# Patient Record
Sex: Female | Born: 1940
Health system: Southern US, Community
[De-identification: ages and names within clinical notes are randomized; demographics above are authoritative.]

## PROBLEM LIST (undated history)

## (undated) DIAGNOSIS — M1990 Unspecified osteoarthritis, unspecified site: Secondary | ICD-10-CM

## (undated) DIAGNOSIS — F419 Anxiety disorder, unspecified: Secondary | ICD-10-CM

## (undated) DIAGNOSIS — H269 Unspecified cataract: Secondary | ICD-10-CM

## (undated) DIAGNOSIS — F32A Depression, unspecified: Secondary | ICD-10-CM

## (undated) DIAGNOSIS — E119 Type 2 diabetes mellitus without complications: Secondary | ICD-10-CM

## (undated) DIAGNOSIS — E079 Disorder of thyroid, unspecified: Secondary | ICD-10-CM

## (undated) DIAGNOSIS — Q159 Congenital malformation of eye, unspecified: Secondary | ICD-10-CM

## (undated) DIAGNOSIS — F329 Major depressive disorder, single episode, unspecified: Secondary | ICD-10-CM

## (undated) DIAGNOSIS — I10 Essential (primary) hypertension: Secondary | ICD-10-CM

## (undated) DIAGNOSIS — E785 Hyperlipidemia, unspecified: Secondary | ICD-10-CM

## (undated) DIAGNOSIS — K219 Gastro-esophageal reflux disease without esophagitis: Secondary | ICD-10-CM

## (undated) DIAGNOSIS — W19XXXA Unspecified fall, initial encounter: Secondary | ICD-10-CM

## (undated) HISTORY — PX: TONSILLECTOMY: SUR1361

## (undated) HISTORY — PX: SALIVARY STONE REMOVAL: SHX5213

## (undated) HISTORY — DX: Depression, unspecified: F32.A

## (undated) HISTORY — DX: Disorder of thyroid, unspecified: E07.9

## (undated) HISTORY — DX: Congenital malformation of eye, unspecified: Q15.9

## (undated) HISTORY — DX: Hyperlipidemia, unspecified: E78.5

## (undated) HISTORY — DX: Major depressive disorder, single episode, unspecified: F32.9

## (undated) HISTORY — DX: Unspecified osteoarthritis, unspecified site: M19.90

## (undated) HISTORY — DX: Type 2 diabetes mellitus without complications: E11.9

## (undated) HISTORY — DX: Unspecified cataract: H26.9

## (undated) HISTORY — DX: Essential (primary) hypertension: I10

## (undated) HISTORY — DX: Gastro-esophageal reflux disease without esophagitis: K21.9

## (undated) HISTORY — DX: Unspecified fall, initial encounter: W19.XXXA

## (undated) HISTORY — DX: Anxiety disorder, unspecified: F41.9

---

## 1976-04-07 HISTORY — PX: ABDOMINAL HYSTERECTOMY: SHX81

## 1978-04-07 HISTORY — PX: BREAST LUMPECTOMY: SHX2

## 2011-03-11 DIAGNOSIS — E559 Vitamin D deficiency, unspecified: Secondary | ICD-10-CM | POA: Insufficient documentation

## 2011-03-11 DIAGNOSIS — R7309 Other abnormal glucose: Secondary | ICD-10-CM | POA: Insufficient documentation

## 2011-03-11 DIAGNOSIS — K219 Gastro-esophageal reflux disease without esophagitis: Secondary | ICD-10-CM | POA: Insufficient documentation

## 2011-03-11 DIAGNOSIS — E669 Obesity, unspecified: Secondary | ICD-10-CM | POA: Insufficient documentation

## 2013-08-05 DIAGNOSIS — R9439 Abnormal result of other cardiovascular function study: Secondary | ICD-10-CM | POA: Insufficient documentation

## 2015-06-15 DIAGNOSIS — M25511 Pain in right shoulder: Secondary | ICD-10-CM | POA: Diagnosis not present

## 2015-06-15 DIAGNOSIS — M170 Bilateral primary osteoarthritis of knee: Secondary | ICD-10-CM | POA: Diagnosis not present

## 2015-07-12 DIAGNOSIS — R748 Abnormal levels of other serum enzymes: Secondary | ICD-10-CM | POA: Diagnosis not present

## 2015-07-12 DIAGNOSIS — E784 Other hyperlipidemia: Secondary | ICD-10-CM | POA: Diagnosis not present

## 2015-07-12 DIAGNOSIS — E559 Vitamin D deficiency, unspecified: Secondary | ICD-10-CM | POA: Diagnosis not present

## 2015-07-12 DIAGNOSIS — R7309 Other abnormal glucose: Secondary | ICD-10-CM | POA: Diagnosis not present

## 2015-08-09 DIAGNOSIS — Z0000 Encounter for general adult medical examination without abnormal findings: Secondary | ICD-10-CM | POA: Diagnosis not present

## 2015-08-09 DIAGNOSIS — E784 Other hyperlipidemia: Secondary | ICD-10-CM | POA: Diagnosis not present

## 2015-08-09 DIAGNOSIS — R7309 Other abnormal glucose: Secondary | ICD-10-CM | POA: Diagnosis not present

## 2015-11-26 DIAGNOSIS — M25512 Pain in left shoulder: Secondary | ICD-10-CM | POA: Diagnosis not present

## 2015-11-26 DIAGNOSIS — M170 Bilateral primary osteoarthritis of knee: Secondary | ICD-10-CM | POA: Diagnosis not present

## 2015-11-26 DIAGNOSIS — M25511 Pain in right shoulder: Secondary | ICD-10-CM | POA: Diagnosis not present

## 2015-11-26 DIAGNOSIS — K219 Gastro-esophageal reflux disease without esophagitis: Secondary | ICD-10-CM | POA: Diagnosis not present

## 2015-11-26 DIAGNOSIS — L309 Dermatitis, unspecified: Secondary | ICD-10-CM | POA: Diagnosis not present

## 2016-01-15 DIAGNOSIS — Z23 Encounter for immunization: Secondary | ICD-10-CM | POA: Diagnosis not present

## 2016-04-18 DIAGNOSIS — I10 Essential (primary) hypertension: Secondary | ICD-10-CM | POA: Diagnosis not present

## 2016-04-18 DIAGNOSIS — L309 Dermatitis, unspecified: Secondary | ICD-10-CM | POA: Diagnosis not present

## 2016-10-13 DIAGNOSIS — I10 Essential (primary) hypertension: Secondary | ICD-10-CM | POA: Diagnosis not present

## 2016-10-20 DIAGNOSIS — L309 Dermatitis, unspecified: Secondary | ICD-10-CM | POA: Diagnosis not present

## 2016-10-20 DIAGNOSIS — E7800 Pure hypercholesterolemia, unspecified: Secondary | ICD-10-CM | POA: Diagnosis not present

## 2016-10-20 DIAGNOSIS — Z6834 Body mass index (BMI) 34.0-34.9, adult: Secondary | ICD-10-CM | POA: Diagnosis not present

## 2016-10-20 DIAGNOSIS — I10 Essential (primary) hypertension: Secondary | ICD-10-CM | POA: Diagnosis not present

## 2016-10-20 DIAGNOSIS — R748 Abnormal levels of other serum enzymes: Secondary | ICD-10-CM | POA: Diagnosis not present

## 2016-10-20 DIAGNOSIS — F329 Major depressive disorder, single episode, unspecified: Secondary | ICD-10-CM | POA: Diagnosis not present

## 2016-11-05 DIAGNOSIS — Z86010 Personal history of colonic polyps: Secondary | ICD-10-CM | POA: Insufficient documentation

## 2016-11-05 DIAGNOSIS — K580 Irritable bowel syndrome with diarrhea: Secondary | ICD-10-CM | POA: Insufficient documentation

## 2016-11-05 DIAGNOSIS — R748 Abnormal levels of other serum enzymes: Secondary | ICD-10-CM | POA: Diagnosis not present

## 2016-11-07 DIAGNOSIS — R748 Abnormal levels of other serum enzymes: Secondary | ICD-10-CM | POA: Diagnosis not present

## 2016-11-07 DIAGNOSIS — Z780 Asymptomatic menopausal state: Secondary | ICD-10-CM | POA: Diagnosis not present

## 2016-11-19 DIAGNOSIS — Z13820 Encounter for screening for osteoporosis: Secondary | ICD-10-CM | POA: Diagnosis not present

## 2016-11-19 DIAGNOSIS — Z780 Asymptomatic menopausal state: Secondary | ICD-10-CM | POA: Diagnosis not present

## 2017-01-20 DIAGNOSIS — Z23 Encounter for immunization: Secondary | ICD-10-CM | POA: Diagnosis not present

## 2017-04-16 ENCOUNTER — Ambulatory Visit (INDEPENDENT_AMBULATORY_CARE_PROVIDER_SITE_OTHER): Payer: Medicare Other | Admitting: Nurse Practitioner

## 2017-04-16 ENCOUNTER — Encounter: Payer: Self-pay | Admitting: Nurse Practitioner

## 2017-04-16 VITALS — BP 132/78 | HR 62 | Temp 98.5°F | Resp 17 | Ht 64.0 in | Wt 196.0 lb

## 2017-04-16 DIAGNOSIS — J00 Acute nasopharyngitis [common cold]: Secondary | ICD-10-CM | POA: Diagnosis not present

## 2017-04-16 DIAGNOSIS — I10 Essential (primary) hypertension: Secondary | ICD-10-CM | POA: Diagnosis not present

## 2017-04-16 DIAGNOSIS — M159 Polyosteoarthritis, unspecified: Secondary | ICD-10-CM

## 2017-04-16 DIAGNOSIS — F419 Anxiety disorder, unspecified: Secondary | ICD-10-CM | POA: Diagnosis not present

## 2017-04-16 DIAGNOSIS — M150 Primary generalized (osteo)arthritis: Secondary | ICD-10-CM

## 2017-04-16 DIAGNOSIS — M1990 Unspecified osteoarthritis, unspecified site: Secondary | ICD-10-CM | POA: Insufficient documentation

## 2017-04-16 DIAGNOSIS — E782 Mixed hyperlipidemia: Secondary | ICD-10-CM

## 2017-04-16 DIAGNOSIS — F329 Major depressive disorder, single episode, unspecified: Secondary | ICD-10-CM | POA: Diagnosis not present

## 2017-04-16 DIAGNOSIS — E785 Hyperlipidemia, unspecified: Secondary | ICD-10-CM | POA: Insufficient documentation

## 2017-04-16 NOTE — Progress Notes (Signed)
Careteam: Patient Care Team: Lauree Chandler, NP as PCP - General (Geriatric Medicine)  Advanced Directive information Does Patient Have a Medical Advance Directive?: Yes, Type of Advance Directive: [place];Living will  No Known Allergies  Chief Complaint  Patient presents with  . Medical Management of Chronic Issues    Pt is being seen to establish care for management of chronic conditions. Pt has had a sore throat for 2 days  . Other    Daughter in room  . Depression    Score of 4; pt reports she takes medication for depression      HPI: Patient is a 77 y.o. female seen in the office today to establish care.   HTN- controlled on lisinopril-hctz, metoprolol  Hyperlipidemia- on lipitor 10 mg daily, omega-3 supplement   OA- using cymbalta and aleve- taking BID routinely and using turmeric   Depression- on Cymbalta  GERD- on nexium, controlled at this time. Also using probiotic   Taking VIt d supplement? Why "I guess I was low on it"  Hx of thyroid disease and was on medication (40 years ago unsure what medication this was) rechecked lab years later and it was fine and then they took her off. Thinking it was hypo - but not sure.  Right sided torn rotator cuff in 2011; recommended surgery but did not have.   Sore throat and post nasal drip for 2 days. Cough but nonproductive.  Feels like there is something in throat/scratchy feeling No fevers or chills.   Number 1 priority is taking care of her husband. Wants to minimize doctors visits because that is time away from him.  Review of Systems:  Review of Systems  HENT: Positive for hearing loss, sore throat and tinnitus (rarely, notices it at night). Negative for congestion and sinus pain.        Full set of dentures for most of her life, her front teeth were bad when she was young and it was cheaper to remove all teeth and get denture  Respiratory: Positive for cough. Negative for sputum  production and shortness of breath.   Cardiovascular: Negative for chest pain, palpitations and leg swelling.       Reports she has MVP  Gastrointestinal: Positive for heartburn. Negative for abdominal pain, constipation and diarrhea.       Hx of hiatal hernia Hx of fecal incontinence   Genitourinary: Positive for frequency and urgency. Negative for dysuria.       OAB, decrease control, urinary incontinence   Musculoskeletal: Positive for joint pain.       Hx of OA in shoulders and knees  Neurological: Negative for dizziness and headaches.  Endo/Heme/Allergies: Positive for environmental allergies.  Psychiatric/Behavioral: Positive for depression. The patient is nervous/anxious.     Past Medical History:  Diagnosis Date  . Cataract   . 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 Hyperlipemia   . Hypertension    Past Surgical History:  Procedure Laterality Date  . ABDOMINAL HYSTERECTOMY  1978  . BREAST LUMPECTOMY  1980  . SALIVARY STONE REMOVAL  1980's  . TONSILLECTOMY  1950's   Social History:   reports that  has never smoked. she has never used smokeless tobacco. She reports that she does not drink alcohol or use drugs.  Family History  Problem Relation Age of Onset  . Heart attack Father 55  . Heart attack Sister   . Arthritis Sister  Medications: Patient's Medications  New Prescriptions   No medications on file  Previous Medications   ASPIRIN EC 81 MG TABLET    Take 81 mg by mouth daily.   ATORVASTATIN (LIPITOR) 10 MG TABLET    Take 10 mg by mouth at bedtime.   CHOLECALCIFEROL (VITAMIN D3) 2000 UNITS TABS    Take 1 tablet by mouth daily.   DULOXETINE (CYMBALTA) 60 MG CAPSULE    Take 60 mg by mouth daily.   ESOMEPRAZOLE (NEXIUM) 20 MG CAPSULE    Take 20 mg by mouth daily at 12 noon.   KRILL OIL (OMEGA-3) 500 MG CAPS    Take 1 capsule by mouth 2 (two) times daily.   LISINOPRIL-HYDROCHLOROTHIAZIDE (PRINZIDE,ZESTORETIC) 10-12.5 MG TABLET    Take 1  tablet by mouth daily.   METOPROLOL SUCCINATE 50 MG CS24    Take 1 tablet by mouth daily.   NAPROXEN SODIUM (ALEVE) 220 MG TABLET    Take 220 mg by mouth 2 (two) times daily as needed.   PROBIOTIC PRODUCT (PROBIOTIC DAILY PO)    Take 1 tablet by mouth daily.   TURMERIC CURCUMIN 500 MG CAPS    Take 1 capsule by mouth 2 (two) times daily.  Modified Medications   No medications on file  Discontinued Medications   No medications on file     Physical Exam:  Vitals:   04/16/17 0857  BP: 132/78  Pulse: 62  Resp: 17  Temp: 98.5 F (36.9 C)  TempSrc: Oral  SpO2: 96%  Weight: 196 lb (88.9 kg)  Height: _0  (1.626 m)   Body mass index is 33.64 kg/m.  Physical Exam  Constitutional: She is oriented to person, place, and time. She appears well-developed and well-nourished. No distress.  HENT:  Head: Normocephalic and atraumatic.  Right Ear: External ear normal.  Left Ear: External ear normal.  Nose: Nose normal.  Mouth/Throat: Oropharynx is clear and moist. No oropharyngeal exudate.  Eyes: Conjunctivae and EOM are normal. Pupils are equal, round, and reactive to light.  Neck: Normal range of motion. Neck supple.  Cardiovascular: Normal rate, regular rhythm and normal heart sounds.  Pulmonary/Chest: Effort normal and breath sounds normal.  Abdominal: Soft. Bowel sounds are normal.  Musculoskeletal: She exhibits no edema.       Right shoulder: She exhibits decreased range of motion, tenderness and pain (with movement).       Left shoulder: She exhibits decreased range of motion.  Neurological: She is alert and oriented to person, place, and time. No cranial nerve deficit. Coordination normal.  Skin: Skin is warm and dry. She is not diaphoretic.  Psychiatric: She has a normal mood and affect.   Labs reviewed: Basic Metabolic Panel: No results for input(s): NA, K, CL, CO2, GLUCOSE, BUN, CREATININE, CALCIUM, MG, PHOS, TSH in the last 8760 hours. Liver Function Tests: No results for  input(s): AST, ALT, ALKPHOS, BILITOT, PROT, ALBUMIN in the last 8760 hours. No results for input(s): LIPASE, AMYLASE in the last 8760 hours. No results for input(s): AMMONIA in the last 8760 hours. CBC: No results for input(s): WBC, NEUTROABS, HGB, HCT, MCV, PLT in the last 8760 hours. Lipid Panel: No results for input(s): CHOL, HDL, LDLCALC, TRIG, CHOLHDL, LDLDIRECT in the last 8760 hours. TSH: No results for input(s): TSH in the last 8760 hours. A1C: No results found for: HGBA1C   Assessment/Plan 1. Essential hypertension Controlled with lisinopril-hctz and metoprolol  - CBC with Differential/Platelets; Future  2. Mixed hyperlipidemia -conts on lipitor  10 mg daily with diet modifications.  - CMP with eGFR; Future - Lipid Panel; Future  3. Anxiety and depression Stable on cymbalta 30 mg daily  4. Primary osteoarthritis involving multiple joints Has had injections to knees and shoulder in the past, does not feel like she wants to have another injection this at this time. Educated about use of NSAIDS.  -she plans to stop aleve and use tylenol PRN  5. Acute nasopharyngitis -supportive care, instructions provided   Next appt: 3 months for EV, to get fasting blood work and [employer] prior  [employer]. Harle Battiest  [facility] [phone] 8 am - 5 pm) [phone] (after hours)

## 2017-04-16 NOTE — Patient Instructions (Signed)
For sinus congestion- neti pot twice daily Plain nasal saline spray throughout the day as needed humidifier in the home to help with the dry air. avoid forcefully blowing nose as this can cause rebound congestion  For sore throat- May use tylenol 500 mg 2 tablets every 8 hours as needed aches and pains or sore throat Warm water with honey and lemon  For cough and congestion- Mucinex DM by mouth twice daily as needed for cough and congestion with full glass of water  Keep well hydrated Cough drops  Avoid NSAIDS To use tylenol for pain Muscle rubs with lidocaine also beneficial

## 2017-04-17 ENCOUNTER — Telehealth: Payer: Self-pay | Admitting: *Deleted

## 2017-04-17 NOTE — Telephone Encounter (Signed)
Patient called and stated that she saw Jessica yesterday. Stated that Panamajessica prescribed patient to take Mucinex for her Sore Throat. Stated that she has gotten 2 tablets in but every time she takes it she feels like she is choking on her congestion. Patient stated that she is not going to take anymore of it. Wants to know if you have any suggestions. Please Advise.

## 2017-04-20 NOTE — Telephone Encounter (Signed)
Patient notified and agreed.  

## 2017-04-20 NOTE — Telephone Encounter (Signed)
She should take tylenol for sore throat, mucinex is for congestion, she can use MUCINEX DM for cough and congestion. To increase water intake

## 2017-05-22 ENCOUNTER — Ambulatory Visit (INDEPENDENT_AMBULATORY_CARE_PROVIDER_SITE_OTHER): Payer: Medicare Other

## 2017-05-22 ENCOUNTER — Telehealth: Payer: Self-pay

## 2017-05-22 ENCOUNTER — Other Ambulatory Visit: Payer: Self-pay

## 2017-05-22 VITALS — BP 135/78 | HR 62 | Temp 98.1°F | Ht 64.0 in | Wt 197.0 lb

## 2017-05-22 DIAGNOSIS — Z0000 Encounter for general adult medical examination without abnormal findings: Secondary | ICD-10-CM | POA: Diagnosis not present

## 2017-05-22 DIAGNOSIS — E782 Mixed hyperlipidemia: Secondary | ICD-10-CM | POA: Diagnosis not present

## 2017-05-22 DIAGNOSIS — I10 Essential (primary) hypertension: Secondary | ICD-10-CM | POA: Diagnosis not present

## 2017-05-22 DIAGNOSIS — Z135 Encounter for screening for eye and ear disorders: Secondary | ICD-10-CM | POA: Diagnosis not present

## 2017-05-22 DIAGNOSIS — E2839 Other primary ovarian failure: Secondary | ICD-10-CM | POA: Diagnosis not present

## 2017-05-22 LAB — CBC WITH DIFFERENTIAL/PLATELET
Basophils Absolute: 99 cells/uL (ref 0–200)
Basophils Relative: 1.1 %
Eosinophils Absolute: 405 cells/uL (ref 15–500)
Eosinophils Relative: 4.5 %
HCT: 43.9 % (ref 35.0–45.0)
Hemoglobin: 14.5 g/dL (ref 11.7–15.5)
Lymphs Abs: 4086 cells/uL — ABNORMAL HIGH (ref 850–3900)
MCH: 28.9 pg (ref 27.0–33.0)
MCHC: 33 g/dL (ref 32.0–36.0)
MCV: 87.5 fL (ref 80.0–100.0)
MPV: 10.9 fL (ref 7.5–12.5)
Monocytes Relative: 6.2 %
Neutro Abs: 3852 cells/uL (ref 1500–7800)
Neutrophils Relative %: 42.8 %
Platelets: 308 10*3/uL (ref 140–400)
RBC: 5.02 10*6/uL (ref 3.80–5.10)
RDW: 13.7 % (ref 11.0–15.0)
Total Lymphocyte: 45.4 %
WBC mixed population: 558 cells/uL (ref 200–950)
WBC: 9 10*3/uL (ref 3.8–10.8)

## 2017-05-22 LAB — COMPLETE METABOLIC PANEL WITH GFR
AG Ratio: 1.7 (calc) (ref 1.0–2.5)
ALT: 18 U/L (ref 6–29)
AST: 17 U/L (ref 10–35)
Albumin: 3.9 g/dL (ref 3.6–5.1)
Alkaline phosphatase (APISO): 193 U/L — ABNORMAL HIGH (ref 33–130)
BUN: 20 mg/dL (ref 7–25)
CO2: 27 mmol/L (ref 20–32)
Calcium: 9.9 mg/dL (ref 8.6–10.4)
Chloride: 104 mmol/L (ref 98–110)
Creat: 0.72 mg/dL (ref 0.60–0.93)
GFR, Est African American: 94 mL/min/{1.73_m2} (ref >=60)
GFR, Est Non African American: 81 mL/min/{1.73_m2} (ref >=60)
Globulin: 2.3 g/dL (calc) (ref 1.9–3.7)
Glucose, Bld: 109 mg/dL — ABNORMAL HIGH (ref 65–99)
Potassium: 4.9 mmol/L (ref 3.5–5.3)
Sodium: 140 mmol/L (ref 135–146)
Total Bilirubin: 0.6 mg/dL (ref 0.2–1.2)
Total Protein: 6.2 g/dL (ref 6.1–8.1)

## 2017-05-22 LAB — LIPID PANEL
Cholesterol: 153 mg/dL (ref <200)
HDL: 53 mg/dL (ref >50)
LDL Cholesterol (Calc): 74 mg/dL (calc)
Non-HDL Cholesterol (Calc): 100 mg/dL (calc) (ref <130)
Total CHOL/HDL Ratio: 2.9 (calc) (ref <5.0)
Triglycerides: 157 mg/dL — ABNORMAL HIGH (ref <150)

## 2017-05-22 MED ORDER — ZOSTER VAC RECOMB ADJUVANTED 50 MCG/0.5ML IM SUSR: 0.5000 mL | Freq: Once | INTRAMUSCULAR | 1 refills | Status: AC

## 2017-05-22 NOTE — Progress Notes (Signed)
Subjective:   April Hill is a 77 y.o. female who presents for Medicare Annual (Subsequent) preventive examination.  Last AWV-08/09/2015    Objective:     Vitals: BP 135/78 (BP Location: Left Arm, Patient Position: Sitting)   Pulse 62   Temp 98.1 F (36.7 C) (Oral)   Ht 5\' 4"  (1.626 m)   Wt 197 lb (89.4 kg)   SpO2 95%   BMI 33.81 kg/m   Body mass index is 33.81 kg/m.  Advanced Directives 05/22/2017 04/16/2017  Does Patient Have a Medical Advance Directive? Yes Yes  Type of Estate agent of [place];Living will Healthcare Power of [place];Living will  Does patient want to make changes to medical advance directive? No - Patient declined -  Copy of Healthcare Power of Attorney in Chart? No - copy requested No - copy requested    Tobacco Social History   Tobacco Use  Smoking Status Never Smoker  Smokeless Tobacco Never Used     Counseling given: Not Answered   Clinical Intake:  Pre-visit preparation completed: No  Pain : 0-10 Pain Score: 6  Pain Type: Chronic pain Pain Location: Back Pain Orientation: Upper, Mid, Lower Pain Radiating Towards: Right arm Pain Descriptors / Indicators: Aching Pain Onset: More than a month ago Pain Frequency: Constant     Nutritional Status: BMI > 30  Obese Nutritional Risks: None Diabetes: No  How often do you need to have someone help you when you read instructions, pamphlets, or other written materials from your doctor or pharmacy?: 1 - Never What is the last grade level you completed in school?: [employer]  Interpreter Needed?: No  Information entered by :: [name], RN  Past Medical History:  Diagnosis Date  . Anxiety and depression   . Cataract   . 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 GERD (gastroesophageal reflux disease)   . Hyperlipemia   . Hypertension   . Osteoarthritis   . Thyroid disease    Past Surgical History:  Procedure Laterality Date  . ABDOMINAL  HYSTERECTOMY  1978  . BREAST LUMPECTOMY  1980  . SALIVARY STONE REMOVAL  1980's  . TONSILLECTOMY  1950's   Family History  Problem Relation Age of Onset  . Hypertension Mother   . Heart disease Mother   . Heart attack Father 32  . Heart disease Father   . Heart attack Sister   . Arthritis Sister   . Diabetes Brother    Social History   Socioeconomic History  . Marital status: Married    Spouse name: None  . Number of children: None  . Years of education: None  . Highest education level: None  Social Needs  . Financial resource strain: Not hard at all  . Food insecurity - worry: Never true  . Food insecurity - inability: Never true  . Transportation needs - medical: No  . Transportation needs - non-medical: No  Occupational History  . None  Tobacco Use  . Smoking status: Never Smoker  . Smokeless tobacco: Never Used  Substance and Sexual Activity  . Alcohol use: No    Frequency: Never  . Drug use: No  . Sexual activity: Not Currently  Other Topics Concern  . None  Social History Narrative   Social History      Diet? Off and on      Do you drink/eat things with caffeine? yes      Marital status?  married  What year were you married? 1960      Do you live in a house, apartment, assisted living, condo, trailer, etc.? House with daughter      Is it one or more stories? 2-we live in basement apt      How many persons live in your home? 4      Do you have any pets in your home? (please list) cat      Highest level of education completed? High school      Current or past profession: Diplomatic Services operational officer- cashier-hostess      Do you exercise?         no                             Type & how often? --      Advanced Directives      Do you have a living will? yes      Do you have a DNR form?       yes                            If not, do you want to discuss one?      Do you have signed POA/HPOA for forms? yes      Functional Status       Do you have difficulty bathing or dressing yourself? no      Do you have difficulty preparing food or eating? no      Do you have difficulty managing your medications? no      Do you have difficulty managing your finances? no      Do you have difficulty affording your medications? no    Outpatient Encounter Medications as of 05/22/2017  Medication Sig  . acetaminophen (TYLENOL) 500 MG tablet Take 500 mg by mouth 2 (two) times daily.  Marland Kitchen aspirin EC 81 MG tablet Take 81 mg by mouth daily.  Marland Kitchen atorvastatin (LIPITOR) 10 MG tablet Take 10 mg by mouth at bedtime.  . Cholecalciferol (VITAMIN D3) 2000 units TABS Take 1 tablet by mouth daily.  . DULoxetine (CYMBALTA) 60 MG capsule Take 60 mg by mouth daily.  Marland Kitchen esomeprazole (NEXIUM) 20 MG capsule Take 20 mg by mouth daily at 12 noon.  [name] Oil (OMEGA-3) 500 MG CAPS Take 1 capsule by mouth 2 (two) times daily.  Marland Kitchen lisinopril-hydrochlorothiazide (PRINZIDE,ZESTORETIC) 10-12.5 MG tablet Take 1 tablet by mouth daily.  . Metoprolol Succinate 50 MG CS24 Take 1 tablet by mouth daily.  . Probiotic Product (PROBIOTIC DAILY PO) Take 1 tablet by mouth daily.  . Turmeric Curcumin 500 MG CAPS Take 1 capsule by mouth 2 (two) times daily.  Marland Kitchen Zoster Vaccine Adjuvanted [facility]) injection Inject 0.5 mLs into the muscle once for 1 dose.  . [DISCONTINUED] Zoster Vaccine Adjuvanted [facility]) injection Inject 0.5 mLs into the muscle once.  . [DISCONTINUED] naproxen sodium (ALEVE) 220 MG tablet Take 220 mg by mouth 2 (two) times daily as needed.   No facility-administered encounter medications on file as of 05/22/2017.     Activities of Daily Living In your present state of health, do you have any difficulty performing the following activities: 05/22/2017  Hearing? N  Vision? Y  Difficulty concentrating or making decisions? N  Walking or climbing stairs? N  Dressing or bathing? N  Doing errands, shopping? N  Preparing Food and eating ? N  Using the  Toilet?  N  In the past six months, have you accidently leaked urine? Y  Do you have problems with loss of bowel control? N  Managing your Medications? N  Managing your Finances? N  Housekeeping or managing your Housekeeping? N    Patient Care Team: Sharon SellerEubanks, Jessica K, NP as PCP - General (Geriatric Medicine)    Assessment:   This is a routine wellness examination for Ketura.  Exercise Activities and Dietary recommendations Current Exercise Habits: The patient does not participate in regular exercise at present, Exercise limited by: None identified  Goals    None      Fall Risk Fall Risk  05/22/2017 04/16/2017  Falls in the past year? No Yes  Number falls in past yr: - 1  Injury with Fall? - No   Is the patient's home free of loose throw rugs in walkways, pet beds, electrical cords, etc?   yes      Grab bars in the bathroom? yes      Handrails on the stairs?   yes      Adequate lighting?   yes  Timed Get Up and Go performed: 18 seconds, fall risk  Depression Screen PHQ 2/9 Scores 05/22/2017 04/16/2017  PHQ - 2 Score 6 4  PHQ- 9 Score 6 4     Cognitive Function MMSE - Mini Mental State Exam 05/22/2017  Orientation to time 5  Orientation to Place 5  Registration 3  Attention/ Calculation 5  Recall 3  Language- name 2 objects 2  Language- repeat 1  Language- follow 3 step command 3  Language- read & follow direction 1  Write a sentence 1  Copy design 0  Total score 29        Immunization History  Administered Date(s) Administered  . Influenza, Seasonal, Injecte, Preservative Fre 04/07/2010  . Influenza-Unspecified 02/06/2016, 01/05/2017  . Pneumococcal Conjugate-13 05/08/2014  . Pneumococcal Polysaccharide-23 04/07/2009  . Zoster 05/08/2014    Qualifies for Shingles Vaccine? Yes, educated and prescription sent to pharmacy  Screening Tests Health Maintenance  Topic Date Due  . TETANUS/TDAP  09/17/1958  . DEXA SCAN  09/16/2004  . INFLUENZA VACCINE  Completed  .  PNA vac Low Risk Adult  Completed    Cancer Screenings: Lung: Low Dose CT Chest recommended if Age 43-80 years, 30 pack-year currently smoking OR have quit w/in 15years. Patient does not qualify. Breast:  Up to date on Mammogram? Yes   Up to date of Bone Density/Dexa? Yes Colorectal: up to date  Additional Screenings:  Hepatitis B/HIV/Syphillis: not indicated Hepatitis C Screening: declined     Plan:    I have personally reviewed and addressed the Medicare Annual Wellness questionnaire and have noted the following in the patient's chart:  A. Medical and social history B. Use of alcohol, tobacco or illicit drugs  C. Current medications and supplements D. Functional ability and status E.  Nutritional status F.  Physical activity G. Advance directives H. List of other physicians I.  Hospitalizations, surgeries, and ER visits in previous 12 months J.  Vitals K. Screenings to include hearing, vision, cognitive, depression L. Referrals and appointments - none  In addition, I have reviewed and discussed with patient certain preventive protocols, quality metrics, and best practice recommendations. A written personalized care plan for preventive services as well as general preventive health recommendations were provided to patient.  See attached scanned questionnaire for additional information.   Signed,   Tyron RussellSara Luz Mares, RN Nurse Health Advisor  Quick Notes   Health Maintenance: Eye exam referral sent in, Shingrix prescription sent. Stated DEXA was done in 2014     Abnormal Screen: PHQ:9-6, MMSE 29/30, Passed clock drawing     Patient Concerns: Pain R arm, would like review of depression medication, still some cough and "tickle" in throat, requested handicap sticker     Nurse Concerns: None

## 2017-05-22 NOTE — Telephone Encounter (Signed)
Called patient after AWV with information that she is not eligable for handicap sticker at this time per PCP, and that DEXA is due and was ordered. Pt answered and understood and agreed

## 2017-05-22 NOTE — Addendum Note (Signed)
Addended by: Tyron RussellSAUNDERS, SARA E on: 05/22/2017 12:20 PM   Modules accepted: Orders

## 2017-05-22 NOTE — Patient Instructions (Signed)
Ms. April Hill , Thank you for taking time to come for your Medicare Wellness Visit. I appreciate your ongoing commitment to your health goals. Please review the following plan we discussed and let me know if I can assist you in the future.   Screening recommendations/referrals: Colonoscopy excluded, you are over age 775 Mammogram up to date Bone Density up to date Recommended yearly ophthalmology/optometry visit for glaucoma screening and checkup Recommended yearly dental visit for hygiene and checkup  Vaccinations: Influenza vaccine up to date, due 2019 fall season Pneumococcal vaccine up to date Tdap vaccine up to date, due 04/07/2022 Shingles vaccine due, prescription sent to pharmacy    Advanced directives: Please bring us a copy of your living will and health care power of attorney  Conditions/risks identified: none  Next appointment: Abbey ChattersJessica Eubanks, NP 07/16/2017 @ 10:45am               Tyron RussellSara Saunders, RN 05/26/2018 @ 10am   Preventive Care 65 Years and Older, Female Preventive care refers to lifestyle choices and visits with your health care provider that can promote health and wellness. What does preventive care include?  A yearly physical exam. This is also called an annual well check.  Dental exams once or twice a year.  Routine eye exams. Ask your health care provider how often you should have your eyes checked.  Personal lifestyle choices, including:  Daily care of your teeth and gums.  Regular physical activity.  Eating a healthy diet.  Avoiding tobacco and drug use.  Limiting alcohol use.  Practicing safe sex.  Taking low-dose aspirin every day.  Taking vitamin and mineral supplements as recommended by your health care provider. What happens during an annual well check? The services and screenings done by your health care provider during your annual well check will depend on your age, overall health, lifestyle risk factors, and family history of  disease. Counseling  Your health care provider may ask you questions about your:  Alcohol use.  Tobacco use.  Drug use.  Emotional well-being.  Home and relationship well-being.  Sexual activity.  Eating habits.  History of falls.  Memory and ability to understand (cognition).  Work and work Astronomerenvironment.  Reproductive health. Screening  You may have the following tests or measurements:  Height, weight, and BMI.  Blood pressure.  Lipid and cholesterol levels. These may be checked every 5 years, or more frequently if you are over 78 years old.  Skin check.  Lung cancer screening. You may have this screening every year starting at age 855 if you have a 30-pack-year history of smoking and currently smoke or have quit within the past 15 years.  Fecal occult blood test (FOBT) of the stool. You may have this test every year starting at age 78.  Flexible sigmoidoscopy or colonoscopy. You may have a sigmoidoscopy every 5 years or a colonoscopy every 10 years starting at age 78.  Hepatitis C blood test.  Hepatitis B blood test.  Sexually transmitted disease (STD) testing.  Diabetes screening. This is done by checking your blood sugar (glucose) after you have not eaten for a while (fasting). You may have this done every 1-3 years.  Bone density scan. This is done to screen for osteoporosis. You may have this done starting at age 78.  Mammogram. This may be done every 1-2 years. Talk to your health care provider about how often you should have regular mammograms. Talk with your health care provider about your test results, treatment  options, and if necessary, the need for more tests. Vaccines  Your health care provider may recommend certain vaccines, such as:  Influenza vaccine. This is recommended every year.  Tetanus, diphtheria, and acellular pertussis (Tdap, Td) vaccine. You may need a Td booster every 10 years.  Zoster vaccine. You may need this after age  47.  Pneumococcal 13-valent conjugate (PCV13) vaccine. One dose is recommended after age 74.  Pneumococcal polysaccharide (PPSV23) vaccine. One dose is recommended after age 88. Talk to your health care provider about which screenings and vaccines you need and how often you need them. This information is not intended to replace advice given to you by your health care provider. Make sure you discuss any questions you have with your health care provider. Document Released: 04/20/2015 Document Revised: 12/12/2015 Document Reviewed: 01/23/2015 Elsevier Interactive Patient Education  2017 [place] Prevention in the Home Falls can cause injuries. They can happen to people of all ages. There are many things you can do to make your home safe and to help prevent falls. What can I do on the outside of my home?  Regularly fix the edges of walkways and driveways and fix any cracks.  Remove anything that might make you trip as you walk through a door, such as a raised step or threshold.  Trim any bushes or trees on the path to your home.  Use bright outdoor lighting.  Clear any walking paths of anything that might make someone trip, such as rocks or tools.  Regularly check to see if handrails are loose or broken. Make sure that both sides of any steps have handrails.  Any raised decks and porches should have guardrails on the edges.  Have any leaves, snow, or ice cleared regularly.  Use sand or salt on walking paths during winter.  Clean up any spills in your garage right away. This includes oil or grease spills. What can I do in the bathroom?  Use night lights.  Install grab bars by the toilet and in the tub and shower. Do not use towel bars as grab bars.  Use non-skid mats or decals in the tub or shower.  If you need to sit down in the shower, use a plastic, non-slip stool.  Keep the floor dry. Clean up any water that spills on the floor as soon as it happens.  Remove  soap buildup in the tub or shower regularly.  Attach bath mats securely with double-sided non-slip rug tape.  Do not have throw rugs and other things on the floor that can make you trip. What can I do in the bedroom?  Use night lights.  Make sure that you have a light by your bed that is easy to reach.  Do not use any sheets or blankets that are too big for your bed. They should not hang down onto the floor.  Have a firm chair that has side arms. You can use this for support while you get dressed.  Do not have throw rugs and other things on the floor that can make you trip. What can I do in the kitchen?  Clean up any spills right away.  Avoid walking on wet floors.  Keep items that you use a lot in easy-to-reach places.  If you need to reach something above you, use a strong step stool that has a grab bar.  Keep electrical cords out of the way.  Do not use floor polish or wax that makes floors slippery.  If you must use wax, use non-skid floor wax.  Do not have throw rugs and other things on the floor that can make you trip. What can I do with my stairs?  Do not leave any items on the stairs.  Make sure that there are handrails on both sides of the stairs and use them. Fix handrails that are broken or loose. Make sure that handrails are as long as the stairways.  Check any carpeting to make sure that it is firmly attached to the stairs. Fix any carpet that is loose or worn.  Avoid having throw rugs at the top or bottom of the stairs. If you do have throw rugs, attach them to the floor with carpet tape.  Make sure that you have a light switch at the top of the stairs and the bottom of the stairs. If you do not have them, ask someone to add them for you. What else can I do to help prevent falls?  Wear shoes that:  Do not have high heels.  Have rubber bottoms.  Are comfortable and fit you well.  Are closed at the toe. Do not wear sandals.  If you use a  stepladder:  Make sure that it is fully opened. Do not climb a closed stepladder.  Make sure that both sides of the stepladder are locked into place.  Ask someone to hold it for you, if possible.  Clearly mark and make sure that you can see:  Any grab bars or handrails.  First and last steps.  Where the edge of each step is.  Use tools that help you move around (mobility aids) if they are needed. These include:  Canes.  Walkers.  Scooters.  Crutches.  Turn on the lights when you go into a dark area. Replace any light bulbs as soon as they burn out.  Set up your furniture so you have a clear path. Avoid moving your furniture around.  If any of your floors are uneven, fix them.  If there are any pets around you, be aware of where they are.  Review your medicines with your doctor. Some medicines can make you feel dizzy. This can increase your chance of falling. Ask your doctor what other things that you can do to help prevent falls. This information is not intended to replace advice given to you by your health care provider. Make sure you discuss any questions you have with your health care provider. Document Released: 01/18/2009 Document Revised: 08/30/2015 Document Reviewed: 04/28/2014 Elsevier Interactive Patient Education  2017 [employer].

## 2017-07-16 ENCOUNTER — Encounter: Payer: Medicare Other | Admitting: Nurse Practitioner

## 2017-07-31 DIAGNOSIS — H3530 Unspecified macular degeneration: Secondary | ICD-10-CM | POA: Diagnosis not present

## 2017-07-31 DIAGNOSIS — Z961 Presence of intraocular lens: Secondary | ICD-10-CM | POA: Diagnosis not present

## 2017-07-31 DIAGNOSIS — H5213 Myopia, bilateral: Secondary | ICD-10-CM | POA: Diagnosis not present

## 2017-07-31 DIAGNOSIS — H524 Presbyopia: Secondary | ICD-10-CM | POA: Diagnosis not present

## 2017-07-31 DIAGNOSIS — H52203 Unspecified astigmatism, bilateral: Secondary | ICD-10-CM | POA: Diagnosis not present

## 2017-09-08 ENCOUNTER — Other Ambulatory Visit: Payer: Self-pay | Admitting: *Deleted

## 2017-09-08 MED ORDER — ATORVASTATIN CALCIUM 10 MG PO TABS: 10.0000 mg | ORAL_TABLET | Freq: Every day | ORAL | 1 refills | Status: DC

## 2017-09-08 MED ORDER — METOPROLOL SUCCINATE 50 MG PO CS24: 1.0000 | EXTENDED_RELEASE_CAPSULE | Freq: Every day | ORAL | 1 refills | Status: DC

## 2017-09-08 NOTE — Telephone Encounter (Signed)
[facility] 

## 2017-09-17 ENCOUNTER — Ambulatory Visit (INDEPENDENT_AMBULATORY_CARE_PROVIDER_SITE_OTHER): Payer: Medicare Other | Admitting: Nurse Practitioner

## 2017-09-17 ENCOUNTER — Encounter: Payer: Self-pay | Admitting: Nurse Practitioner

## 2017-09-17 VITALS — BP 126/74 | HR 65 | Temp 97.9°F | Ht 64.0 in | Wt 192.0 lb

## 2017-09-17 DIAGNOSIS — E782 Mixed hyperlipidemia: Secondary | ICD-10-CM

## 2017-09-17 DIAGNOSIS — M159 Polyosteoarthritis, unspecified: Secondary | ICD-10-CM

## 2017-09-17 DIAGNOSIS — F329 Major depressive disorder, single episode, unspecified: Secondary | ICD-10-CM

## 2017-09-17 DIAGNOSIS — I10 Essential (primary) hypertension: Secondary | ICD-10-CM | POA: Diagnosis not present

## 2017-09-17 DIAGNOSIS — F4321 Adjustment disorder with depressed mood: Secondary | ICD-10-CM | POA: Diagnosis not present

## 2017-09-17 DIAGNOSIS — Z0000 Encounter for general adult medical examination without abnormal findings: Secondary | ICD-10-CM

## 2017-09-17 DIAGNOSIS — F32A Depression, unspecified: Secondary | ICD-10-CM

## 2017-09-17 DIAGNOSIS — M150 Primary generalized (osteo)arthritis: Secondary | ICD-10-CM

## 2017-09-17 LAB — COMPLETE METABOLIC PANEL WITH GFR
AG Ratio: 1.7 (calc) (ref 1.0–2.5)
ALT: 14 U/L (ref 6–29)
AST: 16 U/L (ref 10–35)
Albumin: 4.1 g/dL (ref 3.6–5.1)
Alkaline phosphatase (APISO): 173 U/L — ABNORMAL HIGH (ref 33–130)
BUN: 16 mg/dL (ref 7–25)
CO2: 29 mmol/L (ref 20–32)
Calcium: 9.9 mg/dL (ref 8.6–10.4)
Chloride: 106 mmol/L (ref 98–110)
Creat: 0.69 mg/dL (ref 0.60–0.93)
GFR, Est African American: 97 mL/min/{1.73_m2} (ref >=60)
GFR, Est Non African American: 83 mL/min/{1.73_m2} (ref >=60)
Globulin: 2.4 g/dL (calc) (ref 1.9–3.7)
Glucose, Bld: 110 mg/dL — ABNORMAL HIGH (ref 65–99)
Potassium: 4.9 mmol/L (ref 3.5–5.3)
Sodium: 141 mmol/L (ref 135–146)
Total Bilirubin: 0.9 mg/dL (ref 0.2–1.2)
Total Protein: 6.5 g/dL (ref 6.1–8.1)

## 2017-09-17 MED ORDER — METOPROLOL SUCCINATE ER 50 MG PO TB24: 50.0000 mg | ORAL_TABLET | Freq: Every day | ORAL | 1 refills | Status: DC

## 2017-09-17 NOTE — Progress Notes (Signed)
Provider: Sharon SellerEubanks, Chelsia Serres K, NP  Patient Care Team: Sharon SellerEubanks, Demarqus Jocson K, NP as PCP - General (Geriatric Medicine)  Extended Emergency Contact Information Primary Emergency Contact: Tressia Minersieh, Lester Mobile Phone: [phone] Relation: Spouse Secondary Emergency Contact: Chevis PrettyStull, Tracy Mobile Phone: [phone] Relation: Daughter Allergies  Allergen Reactions  . Amoxicillin Diarrhea   Code Status: FULL Goals of Care: Advanced Directive information Advanced Directives 05/22/2017  Does Patient Have a Medical Advance Directive? Yes  Type of Estate agentAdvance Directive Healthcare Power of [place] Forest[place];Living will  Does patient want to make changes to medical advance directive? No - Patient declined  Copy of Healthcare Power of Attorney in Chart? No - copy requested     Chief Complaint  Patient presents with  . Medical Management of Chronic Issues    Pt is being seen for an extended visit.   . Other    Daughter in room    HPI: Patient is a 77 y.o. female seen in today for an annual wellness exam.   Major illnesses or hospitalization in the last year - none Had eye exam since her AWV.  Depression- on cymbalta, husband recently passed [employer]([place] has hospice as a Theatre stage managerresource.   Depression screen [facility] 09/17/2017 05/22/2017 04/16/2017  Decreased Interest 0 3 2  Down, Depressed, Hopeless 0 3 2  PHQ - 2 Score 0 6 4  Altered sleeping - 0 0  Tired, decreased energy - 0 0  Change in appetite - 0 0  Feeling bad or failure about yourself  - 0 0  Trouble concentrating - 0 0  Moving slowly or fidgety/restless - 0 0  Suicidal thoughts - 0 0  PHQ-9 Score - 6 4  Difficult doing work/chores - Somewhat difficult Somewhat difficult    Fall Risk  09/17/2017 05/22/2017 04/16/2017  Falls in the past year? No No Yes  Number falls in past yr: - - 1  Injury with Fall? - - No   MMSE - Mini Mental State Exam 05/22/2017  Orientation to time 5  Orientation to Place 5  Registration 3    Attention/ Calculation 5  Recall 3  Language- name 2 objects 2  Language- repeat 1  Language- follow 3 step command 3  Language- read & follow direction 1  Write a sentence 1  Copy design 0  Total score 29     Health Maintenance  Topic Date Due  . DEXA SCAN  09/16/2004  . INFLUENZA VACCINE  11/05/2017  . TETANUS/TDAP  04/07/2022  . PNA vac Low Risk Adult  Completed    Urinary incontinence? Mild stress incontinence  Diet?poor diet with recent loss of husband, plans to improve this  No routine exercise Dentition:dentures top and bottom, following with a dentist for proper fit.  Pain: due to arthritis in multiple joints, low back, bilateral knee right worse than left. Tylenol is effective.   Past Medical History:  Diagnosis Date  . Anxiety and depression   . Cataract   . 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 GERD (gastroesophageal reflux disease)   . Hyperlipemia   . Hypertension   . Osteoarthritis   . Thyroid disease     Past Surgical History:  Procedure Laterality Date  . ABDOMINAL HYSTERECTOMY  1978  . BREAST LUMPECTOMY  1980  . SALIVARY STONE REMOVAL  1980's  . TONSILLECTOMY  1950's    Social History   Socioeconomic History  . Marital status: Married  Spouse name: Not on file  . Number of children: Not on file  . Years of education: Not on file  . Highest education level: Not on file  Occupational History  . Not on file  Social Needs  . Financial resource strain: Not hard at all  . Food insecurity:    Worry: Never true    Inability: Never true  . Transportation needs:    Medical: No    Non-medical: No  Tobacco Use  . Smoking status: Never Smoker  . Smokeless tobacco: Never Used  Substance and Sexual Activity  . Alcohol use: No    Frequency: Never  . Drug use: No  . Sexual activity: Not Currently  Lifestyle  . Physical activity:    Days per week: 0 days    Minutes per session: 0 min  . Stress: Very much   Relationships  . Social connections:    Talks on phone: More than three times a week    Gets together: More than three times a week    Attends religious service: Never    Active member of club or organization: No    Attends meetings of clubs or organizations: Never    Relationship status: Married  Other Topics Concern  . Not on file  Social History Narrative   Social History      Diet? Off and on      Do you drink/eat things with caffeine? yes      Marital status?  married                                  What year were you married? 1960      Do you live in a house, apartment, assisted living, condo, trailer, etc.? House with daughter      Is it one or more stories? 2-we live in basement apt      How many persons live in your home? 4      Do you have any pets in your home? (please list) cat      Highest level of education completed? High school      Current or past profession: Diplomatic Services operational officer- cashier-hostess      Do you exercise?         no                             Type & how often? --      Advanced Directives      Do you have a living will? yes      Do you have a DNR form?       yes                            If not, do you want to discuss one?      Do you have signed POA/HPOA for forms? yes      Functional Status      Do you have difficulty bathing or dressing yourself? no      Do you have difficulty preparing food or eating? no      Do you have difficulty managing your medications? no      Do you have difficulty managing your finances? no      Do you have difficulty affording your medications? no    Family History  Problem  Relation Age of Onset  . Hypertension Mother   . Heart disease Mother   . Heart attack Father 85  . Heart disease Father   . Heart attack Sister   . Arthritis Sister   . Diabetes Brother     Review of Systems:  Review of Systems  Constitutional: Negative for activity change, appetite change, fatigue and unexpected weight change.   HENT: Negative for congestion and hearing loss.   Eyes: Negative.   Respiratory: Negative for cough and shortness of breath.   Cardiovascular: Negative for chest pain, palpitations and leg swelling.  Gastrointestinal: Negative for abdominal pain, constipation and diarrhea.  Genitourinary: Negative for difficulty urinating and dysuria.  Musculoskeletal: Positive for arthralgias, gait problem (due to OA) and myalgias.  Skin: Negative for color change and wound.       Picks at skin, causing irritation and scars  Neurological: Negative for dizziness and weakness.  Psychiatric/Behavioral: Negative for agitation, behavioral problems and confusion. The patient is nervous/anxious.        Depression/greif      Allergies as of 09/17/2017      Reactions   Amoxicillin Diarrhea      Medication List        Accurate as of 09/17/17 11:06 AM. Always use your most recent med list.          acetaminophen 500 MG tablet Commonly known as:  TYLENOL Take 500 mg by mouth 2 (two) times daily.   aspirin EC 81 MG tablet Take 81 mg by mouth daily.   atorvastatin 10 MG tablet Commonly known as:  LIPITOR Take 1 tablet (10 mg total) by mouth at bedtime.   DULoxetine 60 MG capsule Commonly known as:  CYMBALTA Take 60 mg by mouth daily.   esomeprazole 20 MG capsule Commonly known as:  NEXIUM Take 20 mg by mouth daily at 12 noon.   lisinopril-hydrochlorothiazide 10-12.5 MG tablet Commonly known as:  PRINZIDE,ZESTORETIC Take 1 tablet by mouth daily.   metoprolol succinate 50 MG 24 hr tablet Commonly known as:  TOPROL-XL Take 1 tablet (50 mg total) by mouth daily. Take with or immediately following a meal.   Omega-3 500 MG Caps Take 1 capsule by mouth 2 (two) times daily.   PROBIOTIC DAILY PO Take 1 tablet by mouth daily.   Turmeric Curcumin 500 MG Caps Take 1 capsule by mouth 2 (two) times daily.   Vitamin D3 2000 units Tabs Take 1 tablet by mouth daily.         Physical  Exam: Vitals:   09/17/17 1041  BP: 126/74  Pulse: 65  Temp: 97.9 F (36.6 C)  TempSrc: Oral  SpO2: 96%  Weight: 192 lb (87.1 kg)  Height: 5\' 4"  (1.626 m)   Body mass index is 32.96 kg/m. Physical Exam  Constitutional: She is oriented to person, place, and time. She appears well-developed and well-nourished. No distress.  HENT:  Head: Normocephalic and atraumatic.  Right Ear: External ear normal.  Left Ear: External ear normal.  Nose: Nose normal.  Mouth/Throat: Oropharynx is clear and moist. No oropharyngeal exudate.  Eyes: Pupils are equal, round, and reactive to light. Conjunctivae and EOM are normal.  Neck: Normal range of motion. Neck supple.  Cardiovascular: Normal rate, regular rhythm and normal heart sounds.  Pulmonary/Chest: Effort normal and breath sounds normal.  Abdominal: Soft. Bowel sounds are normal.  Musculoskeletal: She exhibits no edema.       Right shoulder: She exhibits decreased range of motion, tenderness and  pain.  Neurological: She is alert and oriented to person, place, and time. No cranial nerve deficit. Coordination normal.  Skin: Skin is warm and dry. She is not diaphoretic.  Psychiatric: She has a normal mood and affect.    Labs reviewed: Basic Metabolic Panel: Recent Labs    05/22/17 0907  NA 140  K 4.9  CL 104  CO2 27  GLUCOSE 109*  BUN 20  CREATININE 0.72  CALCIUM 9.9   Liver Function Tests: Recent Labs    05/22/17 0907  AST 17  ALT 18  BILITOT 0.6  PROT 6.2   No results for input(s): LIPASE, AMYLASE in the last 8760 hours. No results for input(s): AMMONIA in the last 8760 hours. CBC: Recent Labs    05/22/17 0907  WBC 9.0  NEUTROABS 3,852  HGB 14.5  HCT 43.9  MCV 87.5  PLT 308   Lipid Panel: Recent Labs    05/22/17 0907  CHOL 153  HDL 53  LDLCALC 74  TRIG 157*  CHOLHDL 2.9   No results found for: HGBA1C  Procedures: No results found.  Assessment/Plan 1. Essential hypertension -stable, will continue  current regimen.  - EKG 12-Lead- sinus bradycardia noted - metoprolol succinate (TOPROL-XL) 50 MG 24 hr tablet; Take 1 tablet (50 mg total) by mouth daily. Take with or immediately following a meal.  Dispense: 90 tablet; Refill: 1  2. Mixed hyperlipidemia -continues on lipitor, encouraged heart healthy diet.  - COMPLETE METABOLIC PANEL WITH GFR  3. Primary osteoarthritis involving multiple joints -ongoing OA which she feels is her biggest issue. Taking tylenol routinely which is effective  4. Depression, unspecified depression type Stable, dealing with loss of her husband 4 days ago. Continues on Cymbalta.   5. Wellness examination Recently loss of husband. AWV was completed 05/22/17 -counseled regarding the appropriate use of alcohol, regular self-examination of the breasts on a monthly basis, prevention of dental and periodontal disease, diet, regular sustained exercise for at least 30 minutes 5 times per week, she no longer does screening interval for mammogram, no current tobacco use.   6. Grief Husband died 4 days ago. She lives with daughter and they are coping together. Encouraged use of hospice resources if needed. Seems to be coping appropriately.   Next appt: 6 months for routine follow up with Dr Renato Gails.  Janene Harvey. Biagio Borg  [facility] [phone]

## 2017-09-17 NOTE — Patient Instructions (Addendum)
Follow up in 6 months with Dr Mariea Clonts, sooner if needed    Health Maintenance, Female Adopting a healthy lifestyle and getting preventive care can go a long way to promote health and wellness. Talk with your health care provider about what schedule of regular examinations is right for you. This is a good chance for you to check in with your provider about disease prevention and staying healthy. In between checkups, there are plenty of things you can do on your own. Experts have done a lot of research about which lifestyle changes and preventive measures are most likely to keep you healthy. Ask your health care provider for more information. Weight and diet Eat a healthy diet  Be sure to include plenty of vegetables, fruits, low-fat dairy products, and lean protein.  Do not eat a lot of foods high in solid fats, added sugars, or salt.  Get regular exercise. This is one of the most important things you can do for your health. ? Most adults should exercise for at least 150 minutes each week. The exercise should increase your heart rate and make you sweat (moderate-intensity exercise). ? Most adults should also do strengthening exercises at least twice a week. This is in addition to the moderate-intensity exercise.  Maintain a healthy weight  Body mass index (BMI) is a measurement that can be used to identify possible weight problems. It estimates body fat based on height and weight. Your health care provider can help determine your BMI and help you achieve or maintain a healthy weight.  For females 84 years of age and older: ? A BMI below 18.5 is considered underweight. ? A BMI of 18.5 to 24.9 is normal. ? A BMI of 25 to 29.9 is considered overweight. ? A BMI of 30 and above is considered obese.  Watch levels of cholesterol and blood lipids  You should start having your blood tested for lipids and cholesterol at 78 years of age, then have this test every 5 years.  You may need to have your  cholesterol levels checked more often if: ? Your lipid or cholesterol levels are high. ? You are older than 78 years of age. ? You are at high risk for heart disease.  Cancer screening Lung Cancer  Lung cancer screening is recommended for adults 24-53 years old who are at high risk for lung cancer because of a history of smoking.  A yearly low-dose CT scan of the lungs is recommended for people who: ? Currently smoke. ? Have quit within the past 15 years. ? Have at least a 30-pack-year history of smoking. A pack year is smoking an average of one pack of cigarettes a day for 1 year.  Yearly screening should continue until it has been 15 years since you quit.  Yearly screening should stop if you develop a health problem that would prevent you from having lung cancer treatment.  Breast Cancer  Practice breast self-awareness. This means understanding how your breasts normally appear and feel.  It also means doing regular breast self-exams. Let your health care provider know about any changes, no matter how small.  If you are in your 20s or 30s, you should have a clinical breast exam (CBE) by a health care provider every 1-3 years as part of a regular health exam.  If you are 35 or older, have a CBE every year. Also consider having a breast X-ray (mammogram) every year.  If you have a family history of breast cancer, talk to  your health care provider about genetic screening.  If you are at high risk for breast cancer, talk to your health care provider about having an MRI and a mammogram every year.  Breast cancer gene (BRCA) assessment is recommended for women who have family members with BRCA-related cancers. BRCA-related cancers include: ? Breast. ? Ovarian. ? Tubal. ? Peritoneal cancers.  Results of the assessment will determine the need for genetic counseling and BRCA1 and BRCA2 testing.  Cervical Cancer Your health care provider may recommend that you be screened regularly  for cancer of the pelvic organs (ovaries, uterus, and vagina). This screening involves a pelvic examination, including checking for microscopic changes to the surface of your cervix (Pap test). You may be encouraged to have this screening done every 3 years, beginning at age 29.  For women ages 47-65, health care providers may recommend pelvic exams and Pap testing every 3 years, or they may recommend the Pap and pelvic exam, combined with testing for human papilloma virus (HPV), every 5 years. Some types of HPV increase your risk of cervical cancer. Testing for HPV may also be done on women of any age with unclear Pap test results.  Other health care providers may not recommend any screening for nonpregnant women who are considered low risk for pelvic cancer and who do not have symptoms. Ask your health care provider if a screening pelvic exam is right for you.  If you have had past treatment for cervical cancer or a condition that could lead to cancer, you need Pap tests and screening for cancer for at least 20 years after your treatment. If Pap tests have been discontinued, your risk factors (such as having a new sexual partner) need to be reassessed to determine if screening should resume. Some women have medical problems that increase the chance of getting cervical cancer. In these cases, your health care provider may recommend more frequent screening and Pap tests.  Colorectal Cancer  This type of cancer can be detected and often prevented.  Routine colorectal cancer screening usually begins at 78 years of age and continues through 78 years of age.  Your health care provider may recommend screening at an earlier age if you have risk factors for colon cancer.  Your health care provider may also recommend using home test kits to check for hidden blood in the stool.  A small camera at the end of a tube can be used to examine your colon directly (sigmoidoscopy or colonoscopy). This is done to  check for the earliest forms of colorectal cancer.  Routine screening usually begins at age 38.  Direct examination of the colon should be repeated every 5-10 years through 78 years of age. However, you may need to be screened more often if early forms of precancerous polyps or small growths are found.  Skin Cancer  Check your skin from head to toe regularly.  Tell your health care provider about any new moles or changes in moles, especially if there is a change in a mole's shape or color.  Also tell your health care provider if you have a mole that is larger than the size of a pencil eraser.  Always use sunscreen. Apply sunscreen liberally and repeatedly throughout the day.  Protect yourself by wearing long sleeves, pants, a wide-brimmed hat, and sunglasses whenever you are outside.  Heart disease, diabetes, and high blood pressure  High blood pressure causes heart disease and increases the risk of stroke. High blood pressure is more likely  to develop in: ? People who have blood pressure in the high end of the normal range (130-139/85-89 mm Hg). ? People who are overweight or obese. ? People who are African American.  If you are 33-33 years of age, have your blood pressure checked every 3-5 years. If you are 32 years of age or older, have your blood pressure checked every year. You should have your blood pressure measured twice-once when you are at a hospital or clinic, and once when you are not at a hospital or clinic. Record the average of the two measurements. To check your blood pressure when you are not at a hospital or clinic, you can use: ? An automated blood pressure machine at a pharmacy. ? A home blood pressure monitor.  If you are between 81 years and 62 years old, ask your health care provider if you should take aspirin to prevent strokes.  Have regular diabetes screenings. This involves taking a blood sample to check your fasting blood sugar level. ? If you are at a  normal weight and have a low risk for diabetes, have this test once every three years after 78 years of age. ? If you are overweight and have a high risk for diabetes, consider being tested at a younger age or more often. Preventing infection Hepatitis B  If you have a higher risk for hepatitis B, you should be screened for this virus. You are considered at high risk for hepatitis B if: ? You were born in a country where hepatitis B is common. Ask your health care provider which countries are considered high risk. ? Your parents were born in a high-risk country, and you have not been immunized against hepatitis B (hepatitis B vaccine). ? You have HIV or AIDS. ? You use needles to inject street drugs. ? You live with someone who has hepatitis B. ? You have had sex with someone who has hepatitis B. ? You get hemodialysis treatment. ? You take certain medicines for conditions, including cancer, organ transplantation, and autoimmune conditions.  Hepatitis C  Blood testing is recommended for: ? Everyone born from [birth year] through 1966. ? Anyone with known risk factors for hepatitis C.  Sexually transmitted infections (STIs)  You should be screened for sexually transmitted infections (STIs) including gonorrhea and chlamydia if: ? You are sexually active and are younger than 78 years of age. ? You are older than 78 years of age and your health care provider tells you that you are at risk for this type of infection. ? Your sexual activity has changed since you were last screened and you are at an increased risk for chlamydia or gonorrhea. Ask your health care provider if you are at risk.  If you do not have HIV, but are at risk, it may be recommended that you take a prescription medicine daily to prevent HIV infection. This is called pre-exposure prophylaxis (PrEP). You are considered at risk if: ? You are sexually active and do not regularly use condoms or know the HIV status of your  partner(s). ? You take drugs by injection. ? You are sexually active with a partner who has HIV.  Talk with your health care provider about whether you are at high risk of being infected with HIV. If you choose to begin PrEP, you should first be tested for HIV. You should then be tested every 3 months for as long as you are taking PrEP. Pregnancy  If you are premenopausal and you may become  pregnant, ask your health care provider about preconception counseling.  If you may become pregnant, take 400 to 800 micrograms (mcg) of folic acid every day.  If you want to prevent pregnancy, talk to your health care provider about birth control (contraception). Osteoporosis and menopause  Osteoporosis is a disease in which the bones lose minerals and strength with aging. This can result in serious bone fractures. Your risk for osteoporosis can be identified using a bone density scan.  If you are 72 years of age or older, or if you are at risk for osteoporosis and fractures, ask your health care provider if you should be screened.  Ask your health care provider whether you should take a calcium or vitamin D supplement to lower your risk for osteoporosis.  Menopause may have certain physical symptoms and risks.  Hormone replacement therapy may reduce some of these symptoms and risks. Talk to your health care provider about whether hormone replacement therapy is right for you. Follow these instructions at home:  Schedule regular health, dental, and eye exams.  Stay current with your immunizations.  Do not use any tobacco products including cigarettes, chewing tobacco, or electronic cigarettes.  If you are pregnant, do not drink alcohol.  If you are breastfeeding, limit how much and how often you drink alcohol.  Limit alcohol intake to no more than 1 drink per day for nonpregnant women. One drink equals 12 ounces of beer, 5 ounces of wine, or 1 ounces of hard liquor.  Do not use street  drugs.  Do not share needles.  Ask your health care provider for help if you need support or information about quitting drugs.  Tell your health care provider if you often feel depressed.  Tell your health care provider if you have ever been abused or do not feel safe at home. This information is not intended to replace advice given to you by your health care provider. Make sure you discuss any questions you have with your health care provider. Document Released: 10/07/2010 Document Revised: 08/30/2015 Document Reviewed: 12/26/2014 Elsevier Interactive Patient Education  [employer].

## 2017-09-22 ENCOUNTER — Encounter: Payer: Self-pay | Admitting: Nurse Practitioner

## 2017-09-22 DIAGNOSIS — R748 Abnormal levels of other serum enzymes: Secondary | ICD-10-CM | POA: Insufficient documentation

## 2017-09-23 ENCOUNTER — Telehealth: Payer: Self-pay

## 2017-09-23 NOTE — Telephone Encounter (Signed)
I called [facility] [place] at [phone] to ask that a copy of the abdominal US report and the bone density report be faxed to the office.   Both will be faxed today.

## 2017-09-23 NOTE — Telephone Encounter (Signed)
There are the telephone results given to patient but not the actual imagining results.

## 2017-09-23 NOTE — Telephone Encounter (Signed)
Results for the US abdominal are in Epic (11/12/16).   The result stated:  Telephone Encounter - Len BlalockGondo, Silvia, LPN - 11/91/478208/13/2018 2:40 PM EDT ----- Message from Lynita Lombardobin Calhoun Burnette, MD sent at 11/12/2016 11:19 PM EDT ----- Please let her know that she has a fatty liver and fatty pancreas which may be contributing to an elevated alkaline phosphatase level. Await for the results from the bone mineral density study  Electronically signed by Len BlalockSilvia Gondo, LPN at 95/62/130808/13/2018 2:40 PM EDT   Bone Density results:   Telephone Encounter - Decoteau, Irma, LPN - 65/78/469608/20/2018 8:27 AM EDT ----- Message from Lynita Lombardobin Calhoun Burnette, MD sent at 11/20/2016 11:36 PM EDT ----- Please let her know that she has bilateral hip osteoporosis. She'll need to make an appointment to come in so we can discuss treatment and further workup. In the meantime she needs to take at least 1000-1200 mg of dietary calcium along with an over-the-counter vitamin D3 approximately 2000 units daily.  Electronically signed by Rhea BleacherIrma Decoteau, LPN at 29/52/841308/20/2018 8:27 AM EDT

## 2017-09-29 NOTE — Telephone Encounter (Signed)
I called [facility] and was told that they need another medical release signed for imaging results. The signed medical release has been sent to the [facility] scanning [facility]. The release will be resent once it has been scanned into the system.

## 2018-01-01 DIAGNOSIS — Z23 Encounter for immunization: Secondary | ICD-10-CM | POA: Diagnosis not present

## 2018-01-14 ENCOUNTER — Other Ambulatory Visit: Payer: Self-pay | Admitting: Nurse Practitioner

## 2018-01-21 ENCOUNTER — Other Ambulatory Visit: Payer: Self-pay | Admitting: *Deleted

## 2018-01-21 MED ORDER — LISINOPRIL-HYDROCHLOROTHIAZIDE 10-12.5 MG PO TABS: 1.0000 | ORAL_TABLET | Freq: Every day | ORAL | 1 refills | Status: DC

## 2018-01-21 MED ORDER — DULOXETINE HCL 60 MG PO CPEP: 60.0000 mg | ORAL_CAPSULE | Freq: Every day | ORAL | 1 refills | Status: DC

## 2018-01-21 NOTE — Telephone Encounter (Signed)
[facility] 

## 2018-01-25 ENCOUNTER — Ambulatory Visit: Payer: Medicare Other | Admitting: Nurse Practitioner

## 2018-01-26 ENCOUNTER — Encounter: Payer: Self-pay | Admitting: Nurse Practitioner

## 2018-01-26 ENCOUNTER — Ambulatory Visit (INDEPENDENT_AMBULATORY_CARE_PROVIDER_SITE_OTHER): Payer: Medicare Other | Admitting: Nurse Practitioner

## 2018-01-26 VITALS — BP 132/84 | HR 63 | Temp 98.6°F | Ht 64.0 in | Wt 202.6 lb

## 2018-01-26 DIAGNOSIS — R739 Hyperglycemia, unspecified: Secondary | ICD-10-CM | POA: Diagnosis not present

## 2018-01-26 DIAGNOSIS — E782 Mixed hyperlipidemia: Secondary | ICD-10-CM

## 2018-01-26 DIAGNOSIS — F4321 Adjustment disorder with depressed mood: Secondary | ICD-10-CM

## 2018-01-26 DIAGNOSIS — F419 Anxiety disorder, unspecified: Secondary | ICD-10-CM

## 2018-01-26 DIAGNOSIS — F329 Major depressive disorder, single episode, unspecified: Secondary | ICD-10-CM

## 2018-01-26 DIAGNOSIS — I10 Essential (primary) hypertension: Secondary | ICD-10-CM | POA: Diagnosis not present

## 2018-01-26 DIAGNOSIS — K219 Gastro-esophageal reflux disease without esophagitis: Secondary | ICD-10-CM | POA: Diagnosis not present

## 2018-01-26 DIAGNOSIS — F32A Depression, unspecified: Secondary | ICD-10-CM

## 2018-01-26 DIAGNOSIS — M150 Primary generalized (osteo)arthritis: Secondary | ICD-10-CM | POA: Diagnosis not present

## 2018-01-26 DIAGNOSIS — M159 Polyosteoarthritis, unspecified: Secondary | ICD-10-CM

## 2018-01-26 MED ORDER — LISINOPRIL-HYDROCHLOROTHIAZIDE 10-12.5 MG PO TABS: 1.0000 | ORAL_TABLET | Freq: Every day | ORAL | 1 refills | Status: DC

## 2018-01-26 MED ORDER — ESOMEPRAZOLE MAGNESIUM 40 MG PO CPDR: 40.0000 mg | DELAYED_RELEASE_CAPSULE | Freq: Every day | ORAL | 1 refills | Status: DC

## 2018-01-26 MED ORDER — DULOXETINE HCL 60 MG PO CPEP: 60.0000 mg | ORAL_CAPSULE | Freq: Every day | ORAL | 1 refills | Status: DC

## 2018-01-26 MED ORDER — METOPROLOL SUCCINATE ER 50 MG PO TB24: 50.0000 mg | ORAL_TABLET | Freq: Every day | ORAL | 1 refills | Status: DC

## 2018-01-26 MED ORDER — ATORVASTATIN CALCIUM 10 MG PO TABS: 10.0000 mg | ORAL_TABLET | Freq: Every day | ORAL | 1 refills | Status: DC

## 2018-01-26 NOTE — Progress Notes (Signed)
Careteam: Patient Care Team: April Chandler, NP as PCP - General (Geriatric Medicine)  Advanced Directive information Does Patient Have a Medical Advance Directive?: Yes, Type of Advance Directive: [place];Living will  Allergies  Allergen Reactions  . Amoxicillin Diarrhea    Chief Complaint  Patient presents with  . Acute Visit    Pt is being seen due to congestion draining into her throat that causes a cough for about a month. Pt is also having some nausea after eating for several weeks.   . Other    daughter in room     HPI: Patient is a 77 y.o. female seen in the office today due to congestion in her throat and being sick on her stomach off and on. Has been on nexium and was effective until about a month ago.  Reports sometimes she coughs with it. States she is not having any sinus congestion or runny nose. Not feeling stopped up. Also questioning some of her OTC medications. Taking turmeric BID for arthritis.  Probiotic has helped her stomach greatly, she was having accidents but now is not.   Hurting all over- using turmeric BID, tylenol 2 tablets twice daily and cymbalta.  Bad OA in bilateral knees, pain in right shoulder. Needs surgery but does not wish to have this.  Previously was getting cortisone injections to help pain.   Increase depression after husband death. Talked to a counselor and plans to do group therapy.  Review of Systems:  Review of Systems  Constitutional: Negative for chills, fever and weight loss.  Gastrointestinal: Negative for abdominal pain, blood in stool, constipation, diarrhea, heartburn, melena, nausea and vomiting.       Reports occasionally will eat something and it does not sit well on her stomach, between hurting and nausea. Has always had stomach issues.   Musculoskeletal: Positive for joint pain and myalgias.       Fibromyalgia   Psychiatric/Behavioral:       Having a rough time with her husband death. Has  months go by the grief gets worse    Past Medical History:  Diagnosis Date  . Anxiety and depression   . Cataract   . 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 GERD (gastroesophageal reflux disease)   . Hyperlipemia   . Hypertension   . Osteoarthritis   . Thyroid disease    Past Surgical History:  Procedure Laterality Date  . ABDOMINAL HYSTERECTOMY  1978  . BREAST LUMPECTOMY  1980  . SALIVARY STONE REMOVAL  1980's  . TONSILLECTOMY  1950's   Social History:   reports that she has never smoked. She has never used smokeless tobacco. She reports that she does not drink alcohol or use drugs.  Family History  Problem Relation Age of Onset  . Hypertension Mother   . Heart disease Mother   . Heart attack Father 81  . Heart disease Father   . Heart attack Sister   . Arthritis Sister   . Diabetes Brother     Medications: Patient's Medications  New Prescriptions   No medications on file  Previous Medications   ACETAMINOPHEN (TYLENOL) 500 MG TABLET    Take 500 mg by mouth 2 (two) times daily.   ASPIRIN EC 81 MG TABLET    Take 81 mg by mouth daily.   ATORVASTATIN (LIPITOR) 10 MG TABLET    TAKE 1 TABLET BY MOUTH AT  BEDTIME   CHOLECALCIFEROL (VITAMIN  D3) 2000 UNITS TABS    Take 1 tablet by mouth daily.   DULOXETINE (CYMBALTA) 60 MG CAPSULE    Take 1 capsule (60 mg total) by mouth daily.   ESOMEPRAZOLE (NEXIUM) 20 MG CAPSULE    Take 20 mg by mouth daily at 12 noon.   KRILL OIL (OMEGA-3) 500 MG CAPS    Take 1 capsule by mouth 2 (two) times daily.   LISINOPRIL-HYDROCHLOROTHIAZIDE (PRINZIDE,ZESTORETIC) 10-12.5 MG TABLET    Take 1 tablet by mouth daily.   METOPROLOL SUCCINATE (TOPROL-XL) 50 MG 24 HR TABLET    TAKE 1 TABLET BY MOUTH  DAILY   PROBIOTIC PRODUCT (PROBIOTIC DAILY PO)    Take 1 tablet by mouth daily.   TURMERIC CURCUMIN 500 MG CAPS    Take 1 capsule by mouth 2 (two) times daily.  Modified Medications   No medications on file  Discontinued Medications    No medications on file     Physical Exam:  Vitals:   01/26/18 0823  BP: 132/84  Pulse: 63  Temp: 98.6 F (37 C)  TempSrc: Oral  SpO2: 95%  Weight: 202 lb 9.6 oz (91.9 kg)  Height: _0  (1.626 m)   Body mass index is 34.78 kg/m.  Physical Exam  Constitutional: She is oriented to person, place, and time. She appears well-developed and well-nourished. No distress.  HENT:  Head: Normocephalic and atraumatic.  Right Ear: External ear normal.  Left Ear: External ear normal.  Nose: Nose normal.  Mouth/Throat: Oropharynx is clear and moist. No oropharyngeal exudate.  Eyes: Pupils are equal, round, and reactive to light. Conjunctivae and EOM are normal.  Neck: Normal range of motion. Neck supple.  Cardiovascular: Normal rate, regular rhythm and normal heart sounds.  Pulmonary/Chest: Effort normal and breath sounds normal.  Abdominal: Soft. Bowel sounds are normal. She exhibits no distension. There is no tenderness.  Musculoskeletal: She exhibits no edema.       Right shoulder: She exhibits decreased range of motion, tenderness and pain.  Neurological: She is alert and oriented to person, place, and time. No cranial nerve deficit. Coordination normal.  Skin: Skin is warm and dry. She is not diaphoretic.  Psychiatric: She has a normal mood and affect.    Labs reviewed: Basic Metabolic Panel: Recent Labs    05/22/17 0907 09/17/17 1144  NA 140 141  K 4.9 4.9  CL 104 106  CO2 27 29  GLUCOSE 109* 110*  BUN 20 16  CREATININE 0.72 0.69  CALCIUM 9.9 9.9   Liver Function Tests: Recent Labs    05/22/17 0907 09/17/17 1144  AST 17 16  ALT 18 14  BILITOT 0.6 0.9  PROT 6.2 6.5   No results for input(s): LIPASE, AMYLASE in the last 8760 hours. No results for input(s): AMMONIA in the last 8760 hours. CBC: Recent Labs    05/22/17 0907  WBC 9.0  NEUTROABS 3,852  HGB 14.5  HCT 43.9  MCV 87.5  PLT 308   Lipid Panel: Recent Labs    05/22/17 0907  CHOL 153  HDL 53    LDLCALC 74  TRIG 157*  CHOLHDL 2.9   TSH: No results for input(s): TSH in the last 8760 hours. A1C: No results found for: HGBA1C   Assessment/Plan 1. Primary osteoarthritis involving multiple joints -previously getting knee injections from orthopedic prior to moving to [place]. With severe OA in bilateral knees.  - Ambulatory referral to Orthopedics  2. Anxiety and depression - DULoxetine (CYMBALTA) 60 MG  capsule; Take 1 capsule (60 mg total) by mouth daily.  Dispense: 90 capsule; Refill: 1  3. Grief -feels like grief is worse as the days/weeks/months go by without her husband. Has met with counselor and planning to go to group meeting but not sure if this is going to help her.   4. Gastroesophageal reflux disease without esophagitis -having more symptoms recently, has been on Nexium 20 for years with good effect. Will increase to 40 mg daily but if not improving symptoms pt to follow up.  - esomeprazole (NEXIUM) 40 MG capsule; Take 1 capsule (40 mg total) by mouth daily at 12 noon.  Dispense: 90 capsule; Refill: 1  5. Mixed hyperlipidemia - COMPLETE METABOLIC PANEL WITH GFR - atorvastatin (LIPITOR) 10 MG tablet; Take 1 tablet (10 mg total) by mouth at bedtime.  Dispense: 90 tablet; Refill: 1  6. Essential hypertension Stable on current regimen.  - CBC with Differential/Platelets - Lipid Panel - lisinopril-hydrochlorothiazide (PRINZIDE,ZESTORETIC) 10-12.5 MG tablet; Take 1 tablet by mouth daily.  Dispense: 90 tablet; Refill: 1 - metoprolol succinate (TOPROL-XL) 50 MG 24 hr tablet; Take 1 tablet (50 mg total) by mouth daily. Take with or immediately following a meal.  Dispense: 90 tablet; Refill: 1   Next appt: follow up in 3 months with Dr Sharee Holster K. Novice, [facility] [phone]

## 2018-01-26 NOTE — Patient Instructions (Signed)
To increase nexium to 40 mg daily for GERD  Referral placed to orthopedic  Continue grief counseling  Follow up with DR REED IN 3 months for ROUTINE FOLLOW UP

## 2018-01-28 ENCOUNTER — Telehealth: Payer: Self-pay | Admitting: *Deleted

## 2018-01-28 LAB — CBC WITH DIFFERENTIAL/PLATELET
Basophils Absolute: 94 cells/uL (ref 0–200)
Basophils Relative: 1.2 %
Eosinophils Absolute: 343 cells/uL (ref 15–500)
Eosinophils Relative: 4.4 %
HCT: 42.8 % (ref 35.0–45.0)
Hemoglobin: 14.3 g/dL (ref 11.7–15.5)
Lymphs Abs: 3260 cells/uL (ref 850–3900)
MCH: 29.9 pg (ref 27.0–33.0)
MCHC: 33.4 g/dL (ref 32.0–36.0)
MCV: 89.4 fL (ref 80.0–100.0)
MPV: 10.9 fL (ref 7.5–12.5)
Monocytes Relative: 7.7 %
Neutro Abs: 3502 cells/uL (ref 1500–7800)
Neutrophils Relative %: 44.9 %
Platelets: 305 10*3/uL (ref 140–400)
RBC: 4.79 10*6/uL (ref 3.80–5.10)
RDW: 12.5 % (ref 11.0–15.0)
Total Lymphocyte: 41.8 %
WBC mixed population: 601 cells/uL (ref 200–950)
WBC: 7.8 10*3/uL (ref 3.8–10.8)

## 2018-01-28 LAB — TEST AUTHORIZATION

## 2018-01-28 LAB — COMPLETE METABOLIC PANEL WITH GFR
AG Ratio: 1.8 (calc) (ref 1.0–2.5)
ALT: 14 U/L (ref 6–29)
AST: 16 U/L (ref 10–35)
Albumin: 4 g/dL (ref 3.6–5.1)
Alkaline phosphatase (APISO): 151 U/L — ABNORMAL HIGH (ref 33–130)
BUN: 18 mg/dL (ref 7–25)
CO2: 31 mmol/L (ref 20–32)
Calcium: 9.6 mg/dL (ref 8.6–10.4)
Chloride: 106 mmol/L (ref 98–110)
Creat: 0.66 mg/dL (ref 0.60–0.93)
GFR, Est African American: 98 mL/min/{1.73_m2} (ref >=60)
GFR, Est Non African American: 85 mL/min/{1.73_m2} (ref >=60)
Globulin: 2.2 g/dL (calc) (ref 1.9–3.7)
Glucose, Bld: 111 mg/dL — ABNORMAL HIGH (ref 65–99)
Potassium: 5.1 mmol/L (ref 3.5–5.3)
Sodium: 142 mmol/L (ref 135–146)
Total Bilirubin: 0.5 mg/dL (ref 0.2–1.2)
Total Protein: 6.2 g/dL (ref 6.1–8.1)

## 2018-01-28 LAB — HEMOGLOBIN A1C
Hgb A1c MFr Bld: 6 % of total Hgb — ABNORMAL HIGH (ref <5.7)
Mean Plasma Glucose: 126 (calc)
eAG (mmol/L): 7 (calc)

## 2018-01-28 LAB — LIPID PANEL
Cholesterol: 150 mg/dL (ref <200)
HDL: 48 mg/dL — ABNORMAL LOW (ref >50)
LDL Cholesterol (Calc): 72 mg/dL (calc)
Non-HDL Cholesterol (Calc): 102 mg/dL (calc) (ref <130)
Total CHOL/HDL Ratio: 3.1 (calc) (ref <5.0)
Triglycerides: 205 mg/dL — ABNORMAL HIGH (ref <150)

## 2018-01-28 NOTE — Telephone Encounter (Signed)
We do not routinely give meloxicam due to the side effects it has on the kidneys and the GI track. Would recommend her seeing the orthopedics first for evaluation and possible injection to help with the pain.

## 2018-01-28 NOTE — Telephone Encounter (Signed)
Patient called and stated that her son is trying the Meloxicam for his Arthritis and stated that it was working well.  Patient is wanting to know if you would prescribe this for her to try so she can come off of the Turmeric and Tylenol. Please Advise.

## 2018-01-28 NOTE — Telephone Encounter (Signed)
Patient notified and agreed. Patient stated that she sees the Orthopaedic November 1.

## 2018-02-05 ENCOUNTER — Ambulatory Visit (INDEPENDENT_AMBULATORY_CARE_PROVIDER_SITE_OTHER): Payer: Self-pay

## 2018-02-05 ENCOUNTER — Encounter (INDEPENDENT_AMBULATORY_CARE_PROVIDER_SITE_OTHER): Payer: Self-pay | Admitting: Family Medicine

## 2018-02-05 ENCOUNTER — Ambulatory Visit (INDEPENDENT_AMBULATORY_CARE_PROVIDER_SITE_OTHER): Payer: Medicare Other | Admitting: Family Medicine

## 2018-02-05 DIAGNOSIS — G8929 Other chronic pain: Secondary | ICD-10-CM | POA: Diagnosis not present

## 2018-02-05 DIAGNOSIS — M25511 Pain in right shoulder: Secondary | ICD-10-CM | POA: Diagnosis not present

## 2018-02-05 DIAGNOSIS — M25512 Pain in left shoulder: Secondary | ICD-10-CM

## 2018-02-05 DIAGNOSIS — M1711 Unilateral primary osteoarthritis, right knee: Secondary | ICD-10-CM | POA: Diagnosis not present

## 2018-02-05 DIAGNOSIS — M1712 Unilateral primary osteoarthritis, left knee: Secondary | ICD-10-CM | POA: Diagnosis not present

## 2018-02-05 NOTE — Progress Notes (Signed)
Office Visit Note   Patient: April Hill           Date of Birth: 19-Nov-1940           MRN: [id number] Visit Date: 02/05/2018 Requested by: [name], NP [address] [place] [place], [place] [address] PCP: [name], NP  Subjective: Chief Complaint  Patient presents with  . Right Shoulder - Pain  . Left Shoulder - Pain  . Right Knee - Pain  . Left Knee - Pain    HPI: She is a 77 year old with right greater than left shoulder pain, and right greater than left knee pain.  Long-standing problems with these areas.  She has been told that she has arthritis.  She used to live in [place], [place] but then moved to [place] for a few years and most recently she came to [place] to be with her daughter after her husband died.  Her right shoulder bothers her severely in the past couple weeks, constant aching pain.  Her left shoulder only bothers her with certain movements.  Her knees hurt mainly when transitioning from sitting to standing, or when going up and down stairs.  Otherwise they seem to be okay.  Medical problems include hyperlipidemia for which she takes Lipitor.  She also has prediabetes and hypertension.  No history of heart disease or stroke to her knowledge.                ROS: Otherwise noncontributory  Objective: Vital Signs: There were no vitals taken for this visit.  Physical Exam:  Right shoulder: Very limited active range of motion.  Significant weakness with empty can test.  Tender in the posterior and lateral subacromial space. Left shoulder: Full active range of motion, pain at the extremes.  Rotator cuff strength is intact. Knees: No effusions today.  No warmth or erythema.  2-3+ bilateral patellofemoral crepitus.  Slightly tender medial joint line in the knees, but mainly tender around the patellofemoral joint.  Imaging: Right shoulder x-rays: High riding humeral head with severe glenohumeral arthritis. Left shoulder x-rays: Moderate glenohumeral  arthritis with normal spacing between humeral head and acromion.  Moderate AC joint DJD. Knees: Probable anterior loose body in the left knee.  Moderate tricompartmental arthritis on the left.  Severe medial compartment DJD on the right with moderate patellofemoral and lateral compartment.  Assessment & Plan: 1.  Right greater than left shoulder pain with probable chronic rotator cuff tear and underlying DJD -Discussed options with her and she wants to have the right shoulder injected today.  Follow-up as needed.  2.  Bilateral knee arthritis, primarily painful in the patellofemoral joints -She wants to try stopping Lipitor for a few weeks to see if her pain improves.  If so, she will ask her PCP about other options for lipid management. -Strongly encouraged her to minimize intake of sugar and processed foods. -Trial of glucosamine in addition to turmeric. -Could inject with cortisone in the future if pain becomes more constant.  She asked about stem cell therapy, I told her that research did not support its use yet.   Follow-Up Instructions: No follow-ups on file.       Procedures: Right shoulder subacromial injection: After sterile prep with Betadine, injected 3 cc 1% lidocaine without epinephrine and 40 mg methylprednisolone from posterior approach into the subacromial space, a flash of clear yellow synovial fluid was obtained prior to injection.   PMFS History: Patient Active Problem List   Diagnosis Date Noted  .  Elevated alkaline phosphatase level 09/22/2017  . Hypertension   . Hyperlipemia   . Anxiety and depression   . Osteoarthritis   . Irritable bowel syndrome with diarrhea 11/05/2016  . History of colon polyps 11/05/2016  . Abnormal nuclear stress test 08/05/2013  . Vitamin D deficiency 03/11/2011  . Obesity 03/11/2011  . Gastro-esophageal reflux disease without esophagitis 03/11/2011  . Abnormal glucose 03/11/2011   Past Medical History:  Diagnosis Date  . Anxiety  and depression   . Cataract   . 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 GERD (gastroesophageal reflux disease)   . Hyperlipemia   . Hypertension   . Osteoarthritis   . Thyroid disease     Family History  Problem Relation Age of Onset  . Hypertension Mother   . Heart disease Mother   . Heart attack Father 98  . Heart disease Father   . Heart attack Sister   . Arthritis Sister   . Diabetes Brother     Past Surgical History:  Procedure Laterality Date  . ABDOMINAL HYSTERECTOMY  1978  . BREAST LUMPECTOMY  1980  . SALIVARY STONE REMOVAL  1980's  . TONSILLECTOMY  1950's   Social History   Occupational History  . Not on file  Tobacco Use  . Smoking status: Never Smoker  . Smokeless tobacco: Never Used  Substance and Sexual Activity  . Alcohol use: No    Frequency: Never  . Drug use: No  . Sexual activity: Not Currently

## 2018-02-05 NOTE — Patient Instructions (Signed)
   1.  Stop lipitor for a few weeks to see if pain improves.  2.  Glucosamine sulfate twice daily.  3.  Minimize intake of sugar/soft drinks/processed foods.

## 2018-03-25 ENCOUNTER — Ambulatory Visit: Payer: Medicare Other | Admitting: Nurse Practitioner

## 2018-05-06 ENCOUNTER — Ambulatory Visit: Payer: Medicare Other | Admitting: Internal Medicine

## 2018-05-10 ENCOUNTER — Encounter: Payer: Self-pay | Admitting: Internal Medicine

## 2018-05-10 ENCOUNTER — Ambulatory Visit (INDEPENDENT_AMBULATORY_CARE_PROVIDER_SITE_OTHER): Payer: Medicare Other | Admitting: Internal Medicine

## 2018-05-10 VITALS — BP 140/80 | HR 61 | Temp 98.0°F | Ht 64.0 in | Wt 202.0 lb

## 2018-05-10 DIAGNOSIS — E6609 Other obesity due to excess calories: Secondary | ICD-10-CM | POA: Diagnosis not present

## 2018-05-10 DIAGNOSIS — R739 Hyperglycemia, unspecified: Secondary | ICD-10-CM | POA: Diagnosis not present

## 2018-05-10 DIAGNOSIS — H9193 Unspecified hearing loss, bilateral: Secondary | ICD-10-CM

## 2018-05-10 DIAGNOSIS — I10 Essential (primary) hypertension: Secondary | ICD-10-CM | POA: Diagnosis not present

## 2018-05-10 DIAGNOSIS — Z6834 Body mass index (BMI) 34.0-34.9, adult: Secondary | ICD-10-CM

## 2018-05-10 DIAGNOSIS — K219 Gastro-esophageal reflux disease without esophagitis: Secondary | ICD-10-CM | POA: Diagnosis not present

## 2018-05-10 DIAGNOSIS — F4321 Adjustment disorder with depressed mood: Secondary | ICD-10-CM

## 2018-05-10 DIAGNOSIS — M150 Primary generalized (osteo)arthritis: Secondary | ICD-10-CM | POA: Diagnosis not present

## 2018-05-10 DIAGNOSIS — E782 Mixed hyperlipidemia: Secondary | ICD-10-CM | POA: Diagnosis not present

## 2018-05-10 DIAGNOSIS — M159 Polyosteoarthritis, unspecified: Secondary | ICD-10-CM

## 2018-05-10 DIAGNOSIS — M8949 Other hypertrophic osteoarthropathy, multiple sites: Secondary | ICD-10-CM

## 2018-05-10 NOTE — Progress Notes (Signed)
[facility]  [facility] Provider:  Tayla Panozzo L. Renato Gailseed, D.O., C.M.D.  Goals of Care:  Advanced Directives 01/26/2018  Does Patient Have a Medical Advance Directive? Yes  Type of Estate agentAdvance Directive Healthcare Power of [place];Living will  Does patient want to make changes to medical advance directive? -  Copy of Healthcare Power of Attorney in Chart? No - copy requested     Chief Complaint  Patient presents with  . Medical Management of Chronic Issues    3mth follow-up    HPI: Patient is a 78 y.o. female seen today for medical management of chronic diseases--NP Janyth Contesubanks provides primary care--this is her doctor visit.  She thought she had bad knees and a rotator cuff.  Arthritis noted.  Taking otc meds:  Turmeric, glucosamine-chondroitin.  Also takes omega 3 fish oil.    She's been to orthopedics and was to be off lipitor short-term, but remains off of it several months later.  Requests her sugar and cholesterol to be rechecked.    Last took lipitor in Nov.  She does not feel any better off of it.  She is fasting.    Stomach is off and on like all her life.  No worse, no better.  Says she, her sister and her mom have always had a spastic stomach.  Doing much better with her fecal incontinence.  Taking probiotic which has helped--no accident in a bout a year.  Had been having an accident several times a month.  No clear foods causing.  Had a lot of urgency.  She tries to avoid foods when out that might set it off.  Sometimes has aching of the abdomen which is not changed.  Nexium helps her heartburn/esophageal spasms.    Her daughter asks about getting her a hearing test.    BP right at upper limit of normal.  She wears a pad all the time--has stress incontinence.  Has been on her bp meds forever.    Had a bone density quite a while ago.  She does not want one.  It was good when she did have it.  She [place].  No family history of breast cancer so  she's stopped mammograms--also reports she would not do anything about.  She also does not get pap smears anymore. Past Medical History:  Diagnosis Date  . Anxiety and depression   . Cataract   . 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 GERD (gastroesophageal reflux disease)   . Hyperlipemia   . Hypertension   . Osteoarthritis   . Thyroid disease     Past Surgical History:  Procedure Laterality Date  . ABDOMINAL HYSTERECTOMY  1978  . BREAST LUMPECTOMY  1980  . SALIVARY STONE REMOVAL  1980's  . TONSILLECTOMY  1950's    Allergies  Allergen Reactions  . Amoxicillin Diarrhea    Outpatient Encounter Medications as of 05/10/2018  Medication Sig  . acetaminophen (TYLENOL) 500 MG tablet Take 500 mg by mouth 2 (two) times daily.  Marland Kitchen. aspirin EC 81 MG tablet Take 81 mg by mouth daily.  Marland Kitchen. atorvastatin (LIPITOR) 10 MG tablet Take 1 tablet (10 mg total) by mouth at bedtime.  . Cholecalciferol (VITAMIN D3) 2000 units TABS Take 1 tablet by mouth daily.  . DULoxetine (CYMBALTA) 60 MG capsule Take 1 capsule (60 mg total) by mouth daily.  Marland Kitchen. esomeprazole (NEXIUM) 40 MG capsule Take 1 capsule (40 mg total)  by mouth daily at 12 noon.  [name] Oil (OMEGA-3) 500 MG CAPS Take 1 capsule by mouth 2 (two) times daily.  Marland Kitchen lisinopril-hydrochlorothiazide (PRINZIDE,ZESTORETIC) 10-12.5 MG tablet Take 1 tablet by mouth daily.  . metoprolol succinate (TOPROL-XL) 50 MG 24 hr tablet Take 1 tablet (50 mg total) by mouth daily. Take with or immediately following a meal.  . Probiotic Product (PROBIOTIC DAILY PO) Take 1 tablet by mouth daily.  . Turmeric Curcumin 500 MG CAPS Take 1 capsule by mouth 2 (two) times daily.   No facility-administered encounter medications on file as of 05/10/2018.     Review of Systems:  Review of Systems  Constitutional: Negative for chills, fever and malaise/fatigue.  HENT: Positive for hearing loss.   Eyes: Negative for blurred vision.       Glasses  Respiratory:  Negative for cough and shortness of breath.   Cardiovascular: Negative for chest pain, palpitations and leg swelling.  Gastrointestinal: Positive for abdominal pain, diarrhea and heartburn. Negative for blood in stool, constipation, melena, nausea and vomiting.       IBS  Genitourinary: Negative for dysuria.  Musculoskeletal: Positive for joint pain. Negative for falls and myalgias.  Skin: Negative for itching and rash.  Neurological: Negative for dizziness, sensory change and loss of consciousness.  Endo/Heme/Allergies: Bruises/bleeds easily.  Psychiatric/Behavioral: Negative for depression, memory loss and suicidal ideas.       Grieving loss of husband in June--does not feel like doing things like walking or eating right at this point    Health Maintenance  Topic Date Due  . DEXA SCAN  09/16/2004  . TETANUS/TDAP  04/07/2022  . INFLUENZA VACCINE  Completed  . PNA vac Low Risk Adult  Completed    Physical Exam: Vitals:   05/10/18 0934  BP: 140/80  Pulse: 61  Temp: 98 F (36.7 C)  TempSrc: Oral  SpO2: 98%  Weight: 202 lb (91.6 kg)  Height: 5\' 4"  (1.626 m)   Body mass index is 34.67 kg/m. Physical Exam Vitals signs reviewed.  Constitutional:      Appearance: Normal appearance.  HENT:     Head: Normocephalic and atraumatic.  Cardiovascular:     Rate and Rhythm: Normal rate and regular rhythm.     Pulses: Normal pulses.     Heart sounds: Normal heart sounds.  Pulmonary:     Effort: Pulmonary effort is normal.     Breath sounds: Normal breath sounds.  Abdominal:     General: Bowel sounds are normal.  Musculoskeletal: Normal range of motion.  Skin:    General: Skin is warm and dry.  Neurological:     General: No focal deficit present.     Mental Status: She is alert and oriented to person, place, and time.  Psychiatric:        Mood and Affect: Mood normal.        Behavior: Behavior normal.     Labs reviewed: Basic Metabolic Panel: Recent Labs     05/22/17 0907 09/17/17 1144 01/26/18 0920  NA 140 141 142  K 4.9 4.9 5.1  CL 104 106 106  CO2 27 29 31   GLUCOSE 109* 110* 111*  BUN 20 16 18   CREATININE 0.72 0.69 0.66  CALCIUM 9.9 9.9 9.6   Liver Function Tests: Recent Labs    05/22/17 0907 09/17/17 1144 01/26/18 0920  AST 17 16 16   ALT 18 14 14   BILITOT 0.6 0.9 0.5  PROT 6.2 6.5 6.2   No results for  input(s): LIPASE, AMYLASE in the last 8760 hours. No results for input(s): AMMONIA in the last 8760 hours. CBC: Recent Labs    05/22/17 0907 01/26/18 0920  WBC 9.0 7.8  NEUTROABS 3,852 3,502  HGB 14.5 14.3  HCT 43.9 42.8  MCV 87.5 89.4  PLT 308 305   Lipid Panel: Recent Labs    05/22/17 0907 01/26/18 0920  CHOL 153 150  HDL 53 48*  LDLCALC 74 72  TRIG 157* 205*  CHOLHDL 2.9 3.1   Lab Results  Component Value Date   HGBA1C 6.0 (H) 01/26/2018    Assessment/Plan 1. Grief reaction -due to loss of husband in June, cont grief counseling through [facility]  2. Body mass index (BMI) of 34.0-34.9 in adult - educated on weight and what the bmi means--discussed she probably ideally should weigh 160 but she has always weighed something like 180-200 -not recently walking and does not diet -has no current interest in exercise due to grief--may consider in the future when she gets through this - Lipid panel - Basic metabolic panel  3. Class 1 obesity due to excess calories without serious comorbidity with body mass index (BMI) of 34.0 to 34.9 in adult - encouraged regular exercise and better eating habits - Lipid panel - Basic metabolic panel  4. Gastroesophageal reflux disease without esophagitis - cont nexium - CBC with Differential/Platelet  5. Primary osteoarthritis involving multiple joints -cont tylenol, omega fish oil, turmeric, glucosamine/chondroitin  6. Mixed hyperlipidemia -she was on lipitor but it was stopped due to potential role in her pains--she does not notice a difference and  hasn't taken it since November - CBC with Differential/Platelet  7. Essential hypertension -bp at goal on metoprolol succinate and lisinopril-hctz - CBC with Differential/Platelet  8. Hyperglycemia -f/u labs - Hemoglobin A1c  9. Bilateral hearing loss, unspecified hearing loss type - her daughter requests pt get a hearing test - Ambulatory referral to Audiology--she agrees since her daughter wants it   Labs/tests ordered:   Orders Placed This Encounter  Procedures  . Lipid panel  . Basic metabolic panel  . CBC with Differential/Platelet  . Hemoglobin A1c  . Ambulatory referral to Audiology    Referral Priority:   Routine    Referral Type:   Audiology Exam    Referral Reason:   Specialty Services Required    Number of Visits Requested:   1   Next appt:  05/26/2018  Leondre Taul L. Mattisen Pohlmann, D.O. Geriatrics [facility] [address], [place] [address] Cell Phone (Mon-Fri 8am-5pm):  [phone] On Call:  [phone] & follow prompts after 5pm & weekends Office Phone:  [phone] Office Fax:  [phone]

## 2018-05-11 LAB — CBC WITH DIFFERENTIAL/PLATELET
Absolute Monocytes: 559 cells/uL (ref 200–950)
Basophils Absolute: 81 cells/uL (ref 0–200)
Basophils Relative: 1 %
Eosinophils Absolute: 332 cells/uL (ref 15–500)
Eosinophils Relative: 4.1 %
HCT: 43 % (ref 35.0–45.0)
Hemoglobin: 14.1 g/dL (ref 11.7–15.5)
Lymphs Abs: 4042 cells/uL — ABNORMAL HIGH (ref 850–3900)
MCH: 29.8 pg (ref 27.0–33.0)
MCHC: 32.8 g/dL (ref 32.0–36.0)
MCV: 90.9 fL (ref 80.0–100.0)
MPV: 11.3 fL (ref 7.5–12.5)
Monocytes Relative: 6.9 %
Neutro Abs: 3086 cells/uL (ref 1500–7800)
Neutrophils Relative %: 38.1 %
Platelets: 304 10*3/uL (ref 140–400)
RBC: 4.73 10*6/uL (ref 3.80–5.10)
RDW: 12.3 % (ref 11.0–15.0)
Total Lymphocyte: 49.9 %
WBC: 8.1 10*3/uL (ref 3.8–10.8)

## 2018-05-11 LAB — BASIC METABOLIC PANEL
BUN: 18 mg/dL (ref 7–25)
CO2: 31 mmol/L (ref 20–32)
Calcium: 9.5 mg/dL (ref 8.6–10.4)
Chloride: 106 mmol/L (ref 98–110)
Creat: 0.66 mg/dL (ref 0.60–0.93)
Glucose, Bld: 103 mg/dL — ABNORMAL HIGH (ref 65–99)
Potassium: 4.9 mmol/L (ref 3.5–5.3)
Sodium: 142 mmol/L (ref 135–146)

## 2018-05-11 LAB — HEMOGLOBIN A1C
Hgb A1c MFr Bld: 6 % of total Hgb — ABNORMAL HIGH (ref <5.7)
Mean Plasma Glucose: 126 (calc)
eAG (mmol/L): 7 (calc)

## 2018-05-11 LAB — LIPID PANEL
Cholesterol: 224 mg/dL — ABNORMAL HIGH (ref <200)
HDL: 52 mg/dL (ref >=50)
LDL Cholesterol (Calc): 135 mg/dL (calc) — ABNORMAL HIGH
Non-HDL Cholesterol (Calc): 172 mg/dL (calc) — ABNORMAL HIGH (ref <130)
Total CHOL/HDL Ratio: 4.3 (calc) (ref <5.0)
Triglycerides: 229 mg/dL — ABNORMAL HIGH (ref <150)

## 2018-05-13 ENCOUNTER — Encounter: Payer: Self-pay | Admitting: *Deleted

## 2018-05-18 ENCOUNTER — Telehealth: Payer: Self-pay | Admitting: *Deleted

## 2018-05-18 DIAGNOSIS — E782 Mixed hyperlipidemia: Secondary | ICD-10-CM

## 2018-05-18 MED ORDER — ATORVASTATIN CALCIUM 10 MG PO TABS: 10.0000 mg | ORAL_TABLET | Freq: Every day | ORAL | 1 refills | Status: DC

## 2018-05-18 NOTE — Telephone Encounter (Signed)
Had pain improved when she stopped lipitor? If there was not a significant difference would follow recommendations by Dr Renato Gails. Due to age and the side effects of mobic do not recommend long term use of mobic.

## 2018-05-18 NOTE — Telephone Encounter (Signed)
Patient stated that NO her pain did not improve after stopping the Lipitor.  She agreed with not taking the Mobic.

## 2018-05-18 NOTE — Telephone Encounter (Signed)
Would recommend restarting the lipitor at this time.

## 2018-05-18 NOTE — Telephone Encounter (Signed)
Notes recorded by Kermit Balo, DO on 05/11/2018 at 11:46 AM EST Cholesterol did trend up as expected without meds and with her not watching what she's eating and not exercising. I recommend she restart her lipitor because her bad cholesterol doubled and her triglycerides (starchy fats) are also quite high now. As discussed at her visit, I recommend she begin walking and eating healthier foods--this April Hill make her feel a little better mentally actually   Patient Notified and agreed. Phoned Rx to Pharmacy per patient.    Patient also wanted to know if she could start taking Meloxicam for her arthritis and pains. Stated that her son takes it and it helps him. Patient stated that she saw the Orthopaedic and he had actually took her off of the Lipitor due to pains. Please Advise.

## 2018-05-18 NOTE — Telephone Encounter (Signed)
Patient agreed.  

## 2018-05-26 ENCOUNTER — Ambulatory Visit: Payer: Self-pay

## 2018-05-26 ENCOUNTER — Encounter: Payer: Medicare Other | Admitting: Family

## 2018-06-07 ENCOUNTER — Encounter: Payer: Self-pay | Admitting: Nurse Practitioner

## 2018-06-11 ENCOUNTER — Other Ambulatory Visit: Payer: Self-pay | Admitting: Internal Medicine

## 2018-06-11 DIAGNOSIS — I10 Essential (primary) hypertension: Secondary | ICD-10-CM

## 2018-06-11 DIAGNOSIS — F329 Major depressive disorder, single episode, unspecified: Secondary | ICD-10-CM

## 2018-06-11 DIAGNOSIS — F419 Anxiety disorder, unspecified: Principal | ICD-10-CM

## 2018-06-11 DIAGNOSIS — F32A Depression, unspecified: Secondary | ICD-10-CM

## 2018-06-16 ENCOUNTER — Ambulatory Visit (INDEPENDENT_AMBULATORY_CARE_PROVIDER_SITE_OTHER): Payer: Medicare Other | Admitting: Family

## 2018-06-16 ENCOUNTER — Other Ambulatory Visit: Payer: Self-pay

## 2018-06-16 ENCOUNTER — Encounter: Payer: Self-pay | Admitting: Family

## 2018-06-16 VITALS — BP 122/66 | HR 96 | Temp 98.6°F | Ht 64.0 in | Wt 202.6 lb

## 2018-06-16 DIAGNOSIS — R059 Cough, unspecified: Secondary | ICD-10-CM

## 2018-06-16 DIAGNOSIS — R0982 Postnasal drip: Secondary | ICD-10-CM

## 2018-06-16 DIAGNOSIS — R05 Cough: Secondary | ICD-10-CM

## 2018-06-16 MED ORDER — FLUTICASONE PROPIONATE 50 MCG/ACT NA SUSP: 2.0000 | Freq: Every day | NASAL | 6 refills | Status: DC

## 2018-06-16 MED ORDER — LORATADINE 10 MG PO TABS: 10.0000 mg | ORAL_TABLET | Freq: Every day | ORAL | 11 refills | Status: DC

## 2018-06-16 NOTE — Patient Instructions (Addendum)
1. Take loratadine 10 mg tablet one by mouth daily x 14 days 2. Flonase 50 mcg/ACT 2 sprays into each nares daily 3. Gurgle throat with warm and salt twice daily x 3 days. 4. Notify provider's office if symptoms worsen or running a fever > 100.5    Postnasal Drip Postnasal drip is the feeling of mucus going down the back of your throat. Mucus is a slimy substance that moistens and cleans your nose and throat, as well as the air pockets in face bones near your forehead and cheeks (sinuses). Small amounts of mucus pass from your nose and sinuses down the back of your throat all the time. This is normal. When you produce too much mucus or the mucus gets too thick, you can feel it. Some common causes of postnasal drip include:  Having more mucus because of: ? A cold or the flu. ? Allergies. ? Cold air. ? Certain medicines.  Having more mucus that is thicker because of: ? A sinus or nasal infection. ? Dry air. ? A food allergy. Follow these instructions at home: Relieving discomfort   Gargle with a salt-water mixture 3-4 times a day or as needed. To make a salt-water mixture, completely dissolve -1 tsp of salt in 1 cup of warm water.  If the air in your home is dry, use a humidifier to add moisture to the air.  Use a saline spray or container (neti pot) to flush out the nose (nasal irrigation). These methods can help clear away mucus and keep the nasal passages moist. General instructions  Take over-the-counter and prescription medicines only as told by your health care provider.  Follow instructions from your health care provider about eating or drinking restrictions. You may need to avoid caffeine.  Avoid things that you know you are allergic to (allergens), like dust, mold, pollen, pets, or certain foods.  Drink enough fluid to keep your urine pale yellow.  Keep all follow-up visits as told by your health care provider. This is important. Contact a health care provider if:   You have a fever.  You have a sore throat.  You have difficulty swallowing.  You have headache.  You have sinus pain.  You have a cough that does not go away.  The mucus from your nose becomes thick and is green or yellow in color.  You have cold or flu symptoms that last more than 10 days. Summary  Postnasal drip is the feeling of mucus going down the back of your throat.  If your health care provider approves, use nasal irrigation or a nasal spray 2?4 times a day.  Avoid things that you know you are allergic to (allergens), like dust, mold, pollen, pets, or certain foods. This information is not intended to replace advice given to you by your health care provider. Make sure you discuss any questions you have with your health care provider. Document Released: 07/07/2016 Document Revised: 07/07/2016 Document Reviewed: 07/07/2016 Elsevier Interactive Patient Education  2019 [employer].

## 2018-06-16 NOTE — Progress Notes (Signed)
Provider: Latorria Zeoli FNP-C  [name], NP  Patient Care Team: [name], NP as PCP - General (Geriatric Medicine)  Extended Emergency Contact Information Primary Emergency Contact: Chevis Pretty Mobile Phone: [phone] Relation: Daughter Secondary Emergency Contact: Farrin, Azzarello Mobile Phone: [phone] Relation: Son  Goals of care: Advanced Directive information Advanced Directives 01/26/2018  Does Patient Have a Medical Advance Directive? Yes  Type of Estate agent of [place];Living will  Does patient want to make changes to medical advance directive? -  Copy of Healthcare Power of Attorney in Chart? No - copy requested     Chief Complaint  Patient presents with  . Acute Visit    A week ago she developed a sore throat. She thought it was allergies until her cough got worse. She will be flying out on Friday for a funeral. No diarrhea or fever.     HPI:  Pt is a 78 y.o. female seen today for an acute visit for evaluation of cough.she states symptoms started as a sore throat one week ago but cough worsen three days ago.she has had clear nasal drainage that runs down her throat then worsen the cough.Cough wakes up at night.she denies any fever,chills,nausea,vomiting or diarrhea.she has not been in contact with sick person and has not travelled recently.she states will be flying out to [place] on Friday for a funeral.   Past Medical History:  Diagnosis Date  . Anxiety and depression   . Cataract   . 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 GERD (gastroesophageal reflux disease)   . Hyperlipemia   . Hypertension   . Osteoarthritis   . Thyroid disease    Past Surgical History:  Procedure Laterality Date  . ABDOMINAL HYSTERECTOMY  1978  . BREAST LUMPECTOMY  1980  . SALIVARY STONE REMOVAL  1980's  . TONSILLECTOMY  1950's    Allergies  Allergen Reactions  . Amoxicillin Diarrhea    Outpatient Encounter  Medications as of 06/16/2018  Medication Sig  . acetaminophen (TYLENOL) 500 MG tablet Take 500 mg by mouth 2 (two) times daily.  Marland Kitchen aspirin EC 81 MG tablet Take 81 mg by mouth daily.  Marland Kitchen atorvastatin (LIPITOR) 10 MG tablet Take 1 tablet (10 mg total) by mouth at bedtime.  . Cholecalciferol (VITAMIN D3) 2000 units TABS Take 1 tablet by mouth daily.  . DULoxetine (CYMBALTA) 60 MG capsule TAKE 1 CAPSULE BY MOUTH  DAILY  . esomeprazole (NEXIUM) 40 MG capsule Take 1 capsule (40 mg total) by mouth daily at 12 noon.  Marland Kitchen glucosamine-chondroitin 500-400 MG tablet Take 1 tablet by mouth 2 (two) times daily.  [name] Oil (OMEGA-3) 500 MG CAPS Take 1 capsule by mouth 2 (two) times daily.  Marland Kitchen lisinopril-hydrochlorothiazide (PRINZIDE,ZESTORETIC) 10-12.5 MG tablet TAKE 1 TABLET BY MOUTH  DAILY  . metoprolol succinate (TOPROL-XL) 50 MG 24 hr tablet Take 1 tablet (50 mg total) by mouth daily. Take with or immediately following a meal.  . Probiotic Product (PROBIOTIC DAILY PO) Take 1 tablet by mouth daily.  . Turmeric Curcumin 500 MG CAPS Take 1 capsule by mouth 2 (two) times daily.   No facility-administered encounter medications on file as of 06/16/2018.     Review of Systems  Constitutional: Negative for appetite change, chills, fatigue and fever.  HENT: Positive for postnasal drip, rhinorrhea and sinus pressure. Negative for congestion, ear discharge, ear pain, sinus pain, sneezing and tinnitus.  Sore throat one week ago.   Eyes: Positive for visual disturbance. Negative for pain, discharge, redness and itching.       Wears eye glasses   Respiratory: Positive for cough. Negative for chest tightness, shortness of breath and wheezing.   Cardiovascular: Negative for chest pain, palpitations and leg swelling.  Gastrointestinal: Negative for abdominal distention, abdominal pain, constipation, diarrhea, nausea and vomiting.  Musculoskeletal: Negative for myalgias.  Skin: Negative for color change, pallor and  rash.  Neurological: Negative for dizziness, light-headedness and headaches.  Psychiatric/Behavioral: Negative for agitation. The patient is not nervous/anxious.        Usually sleeps well at night but lately cough has been waking her up.     Immunization History  Administered Date(s) Administered  . Influenza, High Dose Seasonal PF 01/20/2017  . Influenza, Seasonal, Injecte, Preservative Fre 04/07/2010  . Influenza-Unspecified 02/06/2016, 01/01/2018  . Pneumococcal Conjugate-13 05/08/2014  . Pneumococcal Polysaccharide-23 04/07/2009  . Tdap 04/07/2012  . Zoster 05/08/2014   Pertinent  Health Maintenance Due  Topic Date Due  . DEXA SCAN  05/11/2019 (Originally 09/16/2004)  . INFLUENZA VACCINE  Completed  . PNA vac Low Risk Adult  Completed   Fall Risk  06/16/2018 05/10/2018 01/26/2018 09/17/2017 05/22/2017  Falls in the past year? 0 0 No No No  Number falls in past yr: 0 0 - - -  Injury with Fall? - 0 - - -    Vitals:   06/16/18 1357  BP: 122/66  Pulse: 96  Temp: 98.6 F (37 C)  SpO2: 94%  Weight: 202 lb 9.6 oz (91.9 kg)  Height: 5\' 4"  (1.626 m)   Body mass index is 34.78 kg/m. Physical Exam Vitals signs reviewed.  Constitutional:      General: She is not in acute distress.    Appearance: She is obese. She is not ill-appearing.  HENT:     Head: Normocephalic.     Right Ear: Tympanic membrane, ear canal and external ear normal. There is no impacted cerumen.     Left Ear: Tympanic membrane, ear canal and external ear normal. There is no impacted cerumen.     Nose: Mucosal edema and rhinorrhea present. No nasal tenderness or congestion. Rhinorrhea is clear.     Right Sinus: No maxillary sinus tenderness or frontal sinus tenderness.     Left Sinus: No maxillary sinus tenderness or frontal sinus tenderness.     Mouth/Throat:     Mouth: Mucous membranes are moist.     Pharynx: Oropharynx is clear. No oropharyngeal exudate or posterior oropharyngeal erythema.     Comments:  Post nasal drip  Eyes:     General: No scleral icterus.       Right eye: No discharge.        Left eye: No discharge.     Conjunctiva/sclera: Conjunctivae normal.     Pupils: Pupils are equal, round, and reactive to light.  Neck:     Musculoskeletal: Normal range of motion. No neck rigidity or muscular tenderness.     Vascular: No carotid bruit.  Cardiovascular:     Rate and Rhythm: Normal rate and regular rhythm.     Pulses: Normal pulses.     Heart sounds: Normal heart sounds. No murmur. No friction rub. No gallop.   Pulmonary:     Effort: Pulmonary effort is normal. No respiratory distress.     Breath sounds: Normal breath sounds. No wheezing, rhonchi or rales.  Chest:     Chest wall: No  tenderness.  Abdominal:     General: There is no distension.     Palpations: Abdomen is soft. There is no mass.     Tenderness: There is no abdominal tenderness. There is no right CVA tenderness, left CVA tenderness, guarding or rebound.  Lymphadenopathy:     Cervical: No cervical adenopathy.  Skin:    General: Skin is warm and dry.     Coloration: Skin is not pale.     Findings: No erythema, lesion or rash.  Neurological:     Mental Status: She is alert and oriented to person, place, and time.     Cranial Nerves: No cranial nerve deficit.     Sensory: No sensory deficit.     Motor: No weakness.     Coordination: Coordination normal.  Psychiatric:        Mood and Affect: Mood normal.        Behavior: Behavior normal.        Thought Content: Thought content normal.        Judgment: Judgment normal.    Labs reviewed: Recent Labs    09/17/17 1144 01/26/18 0920 05/10/18 1029  NA 141 142 142  K 4.9 5.1 4.9  CL 106 106 106  CO2 GLUCOSE 110* 111* 103*  BUN CREATININE 0.69 0.66 0.66  CALCIUM 9.9 9.6 9.5   Recent Labs    09/17/17 1144 01/26/18 0920  AST 16 16  ALT 14 14  BILITOT 0.9 0.5  PROT 6.5 6.2   Recent Labs    01/26/18 0920 05/10/18 1029  WBC  7.8 8.1  NEUTROABS 3,502 3,086  HGB 14.3 14.1  HCT 42.8 43.0  MCV 89.4 90.9  PLT 305 304   No results found for: TSH Lab Results  Component Value Date   HGBA1C 6.0 (H) 05/10/2018   Lab Results  Component Value Date   CHOL 224 (H) 05/10/2018   HDL 52 05/10/2018   LDLCALC 135 (H) 05/10/2018   TRIG 229 (H) 05/10/2018   CHOLHDL 4.3 05/10/2018    Significant Diagnostic Results in last 30 days:  No results found.  Assessment/Plan 1. Post-nasal drip Afebrile. - Flonase 50 mcg/ACT 2 spray into each nares daily. - Gurgle throat with warm water and salt twice daily x 3 days.  - Loratadine 10 mg tablet one by mouth once daily x 14 days.   2. Cough Afebrile.Bilateral lungs clear to auscultation.  Suspect cough due to irritation from post nasal drip.  May use OTC cough syrup at night   Family/ staff Communication: Reviewed plan of care with patient.  Labs/tests ordered: None   Rodneisha Bonnet C Malikai Gut, NP

## 2018-08-05 ENCOUNTER — Ambulatory Visit (INDEPENDENT_AMBULATORY_CARE_PROVIDER_SITE_OTHER): Payer: Medicare Other | Admitting: Nurse Practitioner

## 2018-08-05 ENCOUNTER — Other Ambulatory Visit: Payer: Self-pay

## 2018-08-05 ENCOUNTER — Encounter: Payer: Self-pay | Admitting: Nurse Practitioner

## 2018-08-05 DIAGNOSIS — Z0000 Encounter for general adult medical examination without abnormal findings: Secondary | ICD-10-CM | POA: Diagnosis not present

## 2018-08-05 DIAGNOSIS — Z6834 Body mass index (BMI) 34.0-34.9, adult: Secondary | ICD-10-CM | POA: Diagnosis not present

## 2018-08-05 DIAGNOSIS — F329 Major depressive disorder, single episode, unspecified: Secondary | ICD-10-CM | POA: Diagnosis not present

## 2018-08-05 DIAGNOSIS — M150 Primary generalized (osteo)arthritis: Secondary | ICD-10-CM | POA: Diagnosis not present

## 2018-08-05 DIAGNOSIS — K219 Gastro-esophageal reflux disease without esophagitis: Secondary | ICD-10-CM

## 2018-08-05 DIAGNOSIS — E6609 Other obesity due to excess calories: Secondary | ICD-10-CM

## 2018-08-05 DIAGNOSIS — I10 Essential (primary) hypertension: Secondary | ICD-10-CM | POA: Diagnosis not present

## 2018-08-05 DIAGNOSIS — F419 Anxiety disorder, unspecified: Secondary | ICD-10-CM | POA: Diagnosis not present

## 2018-08-05 DIAGNOSIS — R739 Hyperglycemia, unspecified: Secondary | ICD-10-CM

## 2018-08-05 DIAGNOSIS — M159 Polyosteoarthritis, unspecified: Secondary | ICD-10-CM

## 2018-08-05 DIAGNOSIS — E782 Mixed hyperlipidemia: Secondary | ICD-10-CM | POA: Diagnosis not present

## 2018-08-05 NOTE — Progress Notes (Signed)
This service is provided via telemedicine  No vital signs collected/recorded due to the encounter was a telemedicine visit.   Location of patient (ex: home, work):  At son's house in [place], [place]  Patient consents to a telephone visit:  Yes  Location of the provider (ex: office, home):  [facility], Office   Name of any referring provider: N/A  Names of all persons participating in the telemedicine service and their role in the encounter:  S.Chrae B/CMA, [name], NP, and Patient   Time spent on call:  10 min with medical assistant   Virtual Visit via Video Note  I connected with April Hill on 08/05/18 at  1:30 PM EDT by a video enabled telemedicine application and verified that I am speaking with the correct person using two identifiers.  Location: Patient: at her son's home Provider: office    I discussed the limitations of evaluation and management by telemedicine and the availability of in person appointments. The patient expressed understanding and agreed to proceed.     Careteam: Patient Care Team: [name], NP as PCP - General (Geriatric Medicine)  Advanced Directive information Does Patient Have a Medical Advance Directive?: Yes, Type of Advance Directive: Healthcare Power of [place];Living will, Does patient want to make changes to medical advance directive?: No - Patient declined  Allergies  Allergen Reactions  . Amoxicillin Diarrhea    Chief Complaint  Patient presents with  . Medical Management of Chronic Issues    6 month follow-up. Tele/Virtual visit     HPI: Patient is a 78 y.o. female for routine follow up.   Was having fecal incontinence but now taking probiotic and nexium and this has controlled symptoms.   htn- overall feels like blood pressure is fine. Does not check at home. Taking lisinopril hctz and metoprolol   OA bilateral knees and shoulders- using glucosamine-chondroitin an turmeric and using using tylenol  twice daily. Pain is tolerable. No pain when sitting.  Pain limits activity.   Stress incontinence- using pads    Hyperlipidemia- taking Lipitor (restarted after cholesterol was up- being off did not make a difference in her arthritis.   Hyperglycemia- A1c 6.0.  Review of Systems:  Review of Systems  Constitutional: Negative for chills, fever and malaise/fatigue.  HENT: Positive for hearing loss.   Eyes: Negative for blurred vision.       Glasses  Respiratory: Negative for cough and shortness of breath.   Cardiovascular: Negative for chest pain, palpitations and leg swelling.  Gastrointestinal: Negative for abdominal pain, blood in stool, constipation, diarrhea, heartburn, melena, nausea and vomiting.  Genitourinary: Negative for dysuria.  Musculoskeletal: Positive for joint pain. Negative for falls and myalgias.  Skin: Negative for itching and rash.  Neurological: Negative for dizziness, sensory change and loss of consciousness.  Endo/Heme/Allergies: Bruises/bleeds easily.  Psychiatric/Behavioral: Negative for depression, memory loss and suicidal ideas.    Past Medical History:  Diagnosis Date  . Anxiety and depression   . Cataract   . 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 GERD (gastroesophageal reflux disease)   . Hyperlipemia   . Hypertension   . Osteoarthritis   . Thyroid disease    Past Surgical History:  Procedure Laterality Date  . ABDOMINAL HYSTERECTOMY  1978  . BREAST LUMPECTOMY  1980  . SALIVARY STONE REMOVAL  1980's  . TONSILLECTOMY  1950's   Social History:   reports that she has never smoked. She  has never used smokeless tobacco. She reports that she does not drink alcohol or use drugs.  Family History  Problem Relation Age of Onset  . Hypertension Mother   . Heart disease Mother   . Heart attack Father [id number]  . Heart disease Father   . Heart attack Sister   . Arthritis Sister   . Diabetes Brother     Medications: Patient's  Medications  New Prescriptions   No medications on file  Previous Medications   ACETAMINOPHEN (TYLENOL) 500 MG TABLET    Take 500 mg by mouth 2 (two) times daily.   ASPIRIN EC 81 MG TABLET    Take 81 mg by mouth daily.   ATORVASTATIN (LIPITOR) 10 MG TABLET    Take 1 tablet (10 mg total) by mouth at bedtime.   CHOLECALCIFEROL (VITAMIN D3) 2000 UNITS TABS    Take 1 tablet by mouth daily.   DULOXETINE (CYMBALTA) 60 MG CAPSULE    TAKE 1 CAPSULE BY MOUTH  DAILY   ESOMEPRAZOLE (NEXIUM) 40 MG CAPSULE    Take 1 capsule (40 mg total) by mouth daily at 12 noon.   GLUCOSAMINE-CHONDROITIN 500-400 MG TABLET    Take 1 tablet by mouth 2 (two) times daily.   KRILL OIL (OMEGA-3) 500 MG CAPS    Take 1 capsule by mouth 2 (two) times daily.   LISINOPRIL-HYDROCHLOROTHIAZIDE (PRINZIDE,ZESTORETIC) 10-12.5 MG TABLET    TAKE 1 TABLET BY MOUTH  DAILY   METOPROLOL SUCCINATE (TOPROL-XL) 50 MG 24 HR TABLET    Take 1 tablet (50 mg total) by mouth daily. Take with or immediately following a meal.   PROBIOTIC PRODUCT (PROBIOTIC DAILY PO)    Take 1 tablet by mouth daily.   TURMERIC CURCUMIN 500 MG CAPS    Take 1 capsule by mouth 2 (two) times daily.  Modified Medications   No medications on file  Discontinued Medications   FLUTICASONE (FLONASE) 50 MCG/ACT NASAL SPRAY    Place 2 sprays into both nostrils daily for 30 days.   LORATADINE (CLARITIN) 10 MG TABLET    Take 1 tablet (10 mg total) by mouth daily for 14 days.     Physical Exam: unable due to televisit.     Labs reviewed: Basic Metabolic Panel: Recent Labs    09/17/17 1144 01/26/18 0920 05/10/18 1029  NA 141 142 142  K 4.9 5.1 4.9  CL 106 106 106  CO2 29 31 31   GLUCOSE 110* 111* 103*  BUN 16 18 18   CREATININE 0.69 0.66 0.66  CALCIUM 9.9 9.6 9.5   Liver Function Tests: Recent Labs    09/17/17 1144 01/26/18 0920  AST 16 16  ALT 14 14  BILITOT 0.9 0.5  PROT 6.5 6.2   No results for input(s): LIPASE, AMYLASE in the last 8760 hours. No results  for input(s): AMMONIA in the last 8760 hours. CBC: Recent Labs    01/26/18 0920 05/10/18 1029  WBC 7.8 8.1  NEUTROABS 3,502 3,086  HGB 14.3 14.1  HCT 42.8 43.0  MCV 89.4 90.9  PLT 305 304   Lipid Panel: Recent Labs    01/26/18 0920 05/10/18 1029  CHOL 150 224*  HDL 48* 52  LDLCALC 72 135*  TRIG 205* 229*  CHOLHDL 3.1 4.3   TSH: No results for input(s): TSH in the last 8760 hours. A1C: Lab Results  Component Value Date   HGBA1C 6.0 (H) 05/10/2018     Assessment/Plan 1. Anxiety and depression Stable, lots of grief after her  husband death. Feels like she is coping effectively at this time. Mood is stable on cymbalta.   2. Essential hypertension Dicussed dietary modifications as well as medication compliance to check blood pressure at home occasionally (has been borderline high bp in the past) -continue current regimen  3. Mixed hyperlipidemia -continues on Lipitor. Encouraged heart healthy diet.  - Lipid Panel; Future - COMPLETE METABOLIC PANEL WITH GFR; Future  4. Class 1 obesity due to excess calories without serious comorbidity with body mass index (BMI) of 34.0 to 34.9 in adult Has gained weight due to COVID-19 and not getting gout of the house. Encouraged to start light exercise to increase movement and decrease calories (as she is eating more because of lack of other things to do)  5. Gastroesophageal reflux disease without esophagitis Stable. Continue on diet modifications and nexium  6. Primary osteoarthritis involving multiple joints -continue supplements with tylenol twice daily. Recommended to increase strength in LE to help with joint pain.   7. Hyperglycemia -to continue to work on lifestyle modification  - Hemoglobin A1c; Future  Next appt: 3 month for routine follow up.  Janene Harvey. Biagio Borg  [facility] [phone]  Follow Up Instructions:    I discussed the assessment and treatment plan with the patient.  The patient was provided an opportunity to ask questions and all were answered. The patient agreed with the plan and demonstrated an understanding of the instructions.   The patient was advised to call back or seek an in-person evaluation if the symptoms worsen or if the condition fails to improve as anticipated.  I provided 26 minutes of non-face-to-face time during this encounter.   [name], NP avs printed and mailed.

## 2018-08-05 NOTE — Progress Notes (Signed)
Subjective:   Birdie RiddleRose Streng is a 78 y.o. female who presents for Medicare Annual (Subsequent) preventive examination.  Review of Systems:   Cardiac Risk Factors include: advanced age (>3855men, 75>65 women);obesity (BMI >30kg/m2);sedentary lifestyle;dyslipidemia;family history of premature cardiovascular disease     Objective:     Vitals: There were no vitals taken for this visit.  There is no height or weight on file to calculate BMI.  Advanced Directives 08/05/2018 01/26/2018 05/22/2017 04/16/2017  Does Patient Have a Medical Advance Directive? Yes Yes Yes Yes  Type of Estate agentAdvance Directive Healthcare Power of [place];Living will Healthcare Power of [place];Living will Healthcare Power of [place];Living will Healthcare Power of [place];Living will  Does patient want to make changes to medical advance directive? No - Patient declined - No - Patient declined -  Copy of Healthcare Power of Attorney in Chart? No - copy requested No - copy requested No - copy requested No - copy requested    Tobacco Social History   Tobacco Use  Smoking Status Never Smoker  Smokeless Tobacco Never Used     Counseling given: Not Answered   Clinical Intake:  Pre-visit preparation completed: Yes  Pain : No/denies pain     BMI - recorded: 34.78 Nutritional Status: BMI > 30  Obese Nutritional Risks: None Diabetes: No  How often do you need to have someone help you when you read instructions, pamphlets, or other written materials from your doctor or pharmacy?: 1 - Never What is the last grade level you completed in school?: 12 grade        Past Medical History:  Diagnosis Date  . Anxiety and depression   . Cataract   . 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 GERD (gastroesophageal reflux disease)   . Hyperlipemia   . Hypertension   . Osteoarthritis   . Thyroid disease    Past Surgical History:  Procedure Laterality Date  . ABDOMINAL HYSTERECTOMY  1978  . BREAST  LUMPECTOMY  1980  . SALIVARY STONE REMOVAL  1980's  . TONSILLECTOMY  1950's   Family History  Problem Relation Age of Onset  . Hypertension Mother   . Heart disease Mother   . Heart attack Father [id number]  . Heart disease Father   . Heart attack Sister   . Arthritis Sister   . Diabetes Brother    Social History   Socioeconomic History  . Marital status: Married    Spouse name: Not on file  . Number of children: Not on file  . Years of education: Not on file  . Highest education level: Not on file  Occupational History  . Not on file  Social Needs  . Financial resource strain: Not hard at all  . Food insecurity:    Worry: Never true    Inability: Never true  . Transportation needs:    Medical: No    Non-medical: No  Tobacco Use  . Smoking status: Never Smoker  . Smokeless tobacco: Never Used  Substance and Sexual Activity  . Alcohol use: No    Frequency: Never  . Drug use: No  . Sexual activity: Not Currently  Lifestyle  . Physical activity:    Days per week: 0 days    Minutes per session: 0 min  . Stress: Very much  Relationships  . Social connections:    Talks on phone: More than three times a week    Gets together: More than three times a week  Attends religious service: Never    Active member of club or organization: No    Attends meetings of clubs or organizations: Never    Relationship status: Married  Other Topics Concern  . Not on file  Social History Narrative   Social History      Diet? Off and on      Do you drink/eat things with caffeine? yes      Marital status?  married                                  What year were you married? 1960      Do you live in a house, apartment, assisted living, condo, trailer, etc.? House with daughter      Is it one or more stories? 2-we live in basement apt      How many persons live in your home? 4      Do you have any pets in your home? (please list) cat      Highest level of education completed? High  school      Current or past profession: Diplomatic Services operational officer- cashier-hostess      Do you exercise?         no                             Type & how often? --      Advanced Directives      Do you have a living will? yes      Do you have a DNR form?       yes                            If not, do you want to discuss one?      Do you have signed POA/HPOA for forms? yes      Functional Status      Do you have difficulty bathing or dressing yourself? no      Do you have difficulty preparing food or eating? no      Do you have difficulty managing your medications? no      Do you have difficulty managing your finances? no      Do you have difficulty affording your medications? no    Outpatient Encounter Medications as of 08/05/2018  Medication Sig  . acetaminophen (TYLENOL) 500 MG tablet Take 500 mg by mouth 2 (two) times daily.  Marland Kitchen aspirin EC 81 MG tablet Take 81 mg by mouth daily.  Marland Kitchen atorvastatin (LIPITOR) 10 MG tablet Take 1 tablet (10 mg total) by mouth at bedtime.  . Cholecalciferol (VITAMIN D3) 2000 units TABS Take 1 tablet by mouth daily.  . DULoxetine (CYMBALTA) 60 MG capsule TAKE 1 CAPSULE BY MOUTH  DAILY  . esomeprazole (NEXIUM) 40 MG capsule Take 1 capsule (40 mg total) by mouth daily at 12 noon.  Marland Kitchen glucosamine-chondroitin 500-400 MG tablet Take 1 tablet by mouth 2 (two) times daily.  [name] Oil (OMEGA-3) 500 MG CAPS Take 1 capsule by mouth 2 (two) times daily.  Marland Kitchen lisinopril-hydrochlorothiazide (PRINZIDE,ZESTORETIC) 10-12.5 MG tablet TAKE 1 TABLET BY MOUTH  DAILY  . metoprolol succinate (TOPROL-XL) 50 MG 24 hr tablet Take 1 tablet (50 mg total) by mouth daily. Take with or immediately following a meal.  . Probiotic Product (PROBIOTIC DAILY PO) Take 1  tablet by mouth daily.  . Turmeric Curcumin 500 MG CAPS Take 1 capsule by mouth 2 (two) times daily.  . [DISCONTINUED] fluticasone (FLONASE) 50 MCG/ACT nasal spray Place 2 sprays into both nostrils daily for 30 days.  . [DISCONTINUED]  loratadine (CLARITIN) 10 MG tablet Take 1 tablet (10 mg total) by mouth daily for 14 days.   No facility-administered encounter medications on file as of 08/05/2018.     Activities of Daily Living In your present state of health, do you have any difficulty performing the following activities: 08/05/2018  Hearing? N  Vision? N  Difficulty concentrating or making decisions? N  Walking or climbing stairs? Y  Comment knees  Dressing or bathing? N  Doing errands, shopping? N  Preparing Food and eating ? N  Using the Toilet? N  In the past six months, have you accidently leaked urine? Y  Do you have problems with loss of bowel control? N  Managing your Medications? N  Managing your Finances? N  Housekeeping or managing your Housekeeping? N  Some recent data might be hidden    Patient Care Team: [name], NP as PCP - General (Geriatric Medicine)    Assessment:   This is a routine wellness examination for Mirtha.  Exercise Activities and Dietary recommendations Current Exercise Habits: The patient does not participate in regular exercise at present, Exercise limited by: orthopedic condition(s)  Goals    . Patient Stated     Stay healthy        Fall Risk Fall Risk  08/05/2018 06/16/2018 05/10/2018 01/26/2018 09/17/2017  Falls in the past year? 0 0 0 No No  Number falls in past yr: 0 0 0 - -  Injury with Fall? 0 - 0 - -   Is the patient's home free of loose throw rugs in walkways, pet beds, electrical cords, etc?   yes      Grab bars in the bathroom? yes      Handrails on the stairs?   yes      Adequate lighting?   yes  Timed Get Up and Go performed: na  Depression Screen PHQ 2/9 Scores 08/05/2018 05/10/2018 09/17/2017 05/22/2017  PHQ - 2 Score 0 0 0 6  PHQ- 9 Score - - - 6     Cognitive Function MMSE - Mini Mental State Exam 05/22/2017  Orientation to time 5  Orientation to Place 5  Registration 3  Attention/ Calculation 5  Recall 3  Language- name 2 objects 2   Language- repeat 1  Language- follow 3 step command 3  Language- read & follow direction 1  Write a sentence 1  Copy design 0  Total score 29     6CIT Screen 08/05/2018  What Year? 0 points  What month? 0 points  What time? 0 points  Count back from 20 0 points  Months in reverse 0 points  Repeat phrase 0 points  Total Score 0    Immunization History  Administered Date(s) Administered  . Influenza, High Dose Seasonal PF 01/20/2017  . Influenza, Seasonal, Injecte, Preservative Fre 04/07/2010  . Influenza-Unspecified 02/06/2016, 01/01/2018  . Pneumococcal Conjugate-13 05/08/2014  . Pneumococcal Polysaccharide-23 04/07/2009  . Tdap 04/07/2012  . Zoster 05/08/2014    Qualifies for Shingles Vaccine?yes   Screening Tests Health Maintenance  Topic Date Due  . DEXA SCAN  05/11/2019 (Originally 09/16/2004)  . INFLUENZA VACCINE  11/06/2018  . TETANUS/TDAP  04/07/2022  . PNA vac Low Risk Adult  Completed  Cancer Screenings: Lung: Low Dose CT Chest recommended if Age 15-80 years, 30 pack-year currently smoking OR have quit w/in 15years. Patient does not qualify. Breast:  Up to date on Mammogram? Aged out Up to date of Bone Density/Dexa? Aged out Colorectal: up to date  Additional Screenings: : Hepatitis C Screening: na     Plan:      I have personally reviewed and noted the following in the patient's chart:   . Medical and social history . Use of alcohol, tobacco or illicit drugs  . Current medications and supplements . Functional ability and status . Nutritional status . Physical activity . Advanced directives . List of other physicians . Hospitalizations, surgeries, and ER visits in previous 12 months . Vitals . Screenings to include cognitive, depression, and falls . Referrals and appointments  In addition, I have reviewed and discussed with patient certain preventive protocols, quality metrics, and best practice recommendations. A written personalized care  plan for preventive services as well as general preventive health recommendations were provided to patient.     [name], NP  08/05/2018

## 2018-08-05 NOTE — Patient Instructions (Addendum)
April Hill , Thank you for taking time to come for your Medicare Wellness Visit. I appreciate your ongoing commitment to your health goals. Please review the following plan we discussed and let me know if I can assist you in the future.   Screening recommendations/referrals: Colonoscopy- aged out Mammogram- aged out Bone Density- decline  Recommended yearly ophthalmology/optometry visit for glaucoma screening and checkup Recommended yearly dental visit for hygiene and checkup  Vaccinations: Influenza vaccine Due 12/2018 Pneumococcal vaccine - up to date Tdap vaccine -up to date Shingles vaccine -continue to follow up with Pharmacy     Advanced directives: please bring copy so we can have on file   Conditions/risks identified: none  Next appointment: 1 year   Preventive Care 65 Years and Older, Female Preventive care refers to lifestyle choices and visits with your health care provider that can promote health and wellness. What does preventive care include?  A yearly physical exam. This is also called an annual well check.  Dental exams once or twice a year.  Routine eye exams. Ask your health care provider how often you should have your eyes checked.  Personal lifestyle choices, including:  Daily care of your teeth and gums.  Regular physical activity.  Eating a healthy diet.  Avoiding tobacco and drug use.  Limiting alcohol use.  Practicing safe sex.  Taking low-dose aspirin every day.  Taking vitamin and mineral supplements as recommended by your health care provider. What happens during an annual well check? The services and screenings done by your health care provider during your annual well check will depend on your age, overall health, lifestyle risk factors, and family history of disease. Counseling  Your health care provider may ask you questions about your:  Alcohol use.  Tobacco use.  Drug use.  Emotional well-being.  Home and relationship  well-being.  Sexual activity.  Eating habits.  History of falls.  Memory and ability to understand (cognition).  Work and work Astronomer.  Reproductive health. Screening  You may have the following tests or measurements:  Height, weight, and BMI.  Blood pressure.  Lipid and cholesterol levels. These may be checked every 5 years, or more frequently if you are over 36 years old.  Skin check.  Lung cancer screening. You may have this screening every year starting at age 56 if you have a 30-pack-year history of smoking and currently smoke or have quit within the past 15 years.  Fecal occult blood test (FOBT) of the stool. You may have this test every year starting at age 55.  Flexible sigmoidoscopy or colonoscopy. You may have a sigmoidoscopy every 5 years or a colonoscopy every 10 years starting at age 32.  Hepatitis C blood test.  Hepatitis B blood test.  Sexually transmitted disease (STD) testing.  Diabetes screening. This is done by checking your blood sugar (glucose) after you have not eaten for a while (fasting). You may have this done every 1-3 years.  Bone density scan. This is done to screen for osteoporosis. You may have this done starting at age 78.  Mammogram. This may be done every 1-2 years. Talk to your health care provider about how often you should have regular mammograms. Talk with your health care provider about your test results, treatment options, and if necessary, the need for more tests. Vaccines  Your health care provider may recommend certain vaccines, such as:  Influenza vaccine. This is recommended every year.  Tetanus, diphtheria, and acellular pertussis (Tdap, Td) vaccine. You  may need a Td booster every 10 years.  Zoster vaccine. You may need this after age 75.  Pneumococcal 13-valent conjugate (PCV13) vaccine. One dose is recommended after age 58.  Pneumococcal polysaccharide (PPSV23) vaccine. One dose is recommended after age 20.  Talk to your health care provider about which screenings and vaccines you need and how often you need them. This information is not intended to replace advice given to you by your health care provider. Make sure you discuss any questions you have with your health care provider. Document Released: 04/20/2015 Document Revised: 12/12/2015 Document Reviewed: 01/23/2015 Elsevier Interactive Patient Education  2017 [employer] Prevention in the Home Falls can cause injuries. They can happen to people of all ages. There are many things you can do to make your home safe and to help prevent falls. What can I do on the outside of my home?  Regularly fix the edges of walkways and driveways and fix any cracks.  Remove anything that might make you trip as you walk through a door, such as a raised step or threshold.  Trim any bushes or trees on the path to your home.  Use bright outdoor lighting.  Clear any walking paths of anything that might make someone trip, such as rocks or tools.  Regularly check to see if handrails are loose or broken. Make sure that both sides of any steps have handrails.  Any raised decks and porches should have guardrails on the edges.  Have any leaves, snow, or ice cleared regularly.  Use sand or salt on walking paths during winter.  Clean up any spills in your garage right away. This includes oil or grease spills. What can I do in the bathroom?  Use night lights.  Install grab bars by the toilet and in the tub and shower. Do not use towel bars as grab bars.  Use non-skid mats or decals in the tub or shower.  If you need to sit down in the shower, use a plastic, non-slip stool.  Keep the floor dry. Clean up any water that spills on the floor as soon as it happens.  Remove soap buildup in the tub or shower regularly.  Attach bath mats securely with double-sided non-slip rug tape.  Do not have throw rugs and other things on the floor that can make you  trip. What can I do in the bedroom?  Use night lights.  Make sure that you have a light by your bed that is easy to reach.  Do not use any sheets or blankets that are too big for your bed. They should not hang down onto the floor.  Have a firm chair that has side arms. You can use this for support while you get dressed.  Do not have throw rugs and other things on the floor that can make you trip. What can I do in the kitchen?  Clean up any spills right away.  Avoid walking on wet floors.  Keep items that you use a lot in easy-to-reach places.  If you need to reach something above you, use a strong step stool that has a grab bar.  Keep electrical cords out of the way.  Do not use floor polish or wax that makes floors slippery. If you must use wax, use non-skid floor wax.  Do not have throw rugs and other things on the floor that can make you trip. What can I do with my stairs?  Do not leave any items  on the stairs.  Make sure that there are handrails on both sides of the stairs and use them. Fix handrails that are broken or loose. Make sure that handrails are as long as the stairways.  Check any carpeting to make sure that it is firmly attached to the stairs. Fix any carpet that is loose or worn.  Avoid having throw rugs at the top or bottom of the stairs. If you do have throw rugs, attach them to the floor with carpet tape.  Make sure that you have a light switch at the top of the stairs and the bottom of the stairs. If you do not have them, ask someone to add them for you. What else can I do to help prevent falls?  Wear shoes that:  Do not have high heels.  Have rubber bottoms.  Are comfortable and fit you well.  Are closed at the toe. Do not wear sandals.  If you use a stepladder:  Make sure that it is fully opened. Do not climb a closed stepladder.  Make sure that both sides of the stepladder are locked into place.  Ask someone to hold it for you, if  possible.  Clearly mark and make sure that you can see:  Any grab bars or handrails.  First and last steps.  Where the edge of each step is.  Use tools that help you move around (mobility aids) if they are needed. These include:  Canes.  Walkers.  Scooters.  Crutches.  Turn on the lights when you go into a dark area. Replace any light bulbs as soon as they burn out.  Set up your furniture so you have a clear path. Avoid moving your furniture around.  If any of your floors are uneven, fix them.  If there are any pets around you, be aware of where they are.  Review your medicines with your doctor. Some medicines can make you feel dizzy. This can increase your chance of falling. Ask your doctor what other things that you can do to help prevent falls. This information is not intended to replace advice given to you by your health care provider. Make sure you discuss any questions you have with your health care provider. Document Released: 01/18/2009 Document Revised: 08/30/2015 Document Reviewed: 04/28/2014 Elsevier Interactive Patient Education  2017 [employer].

## 2018-08-05 NOTE — Patient Instructions (Signed)
It was great talking to you! Keep Safe!  Recommend to slowly increase physical activity- this will be good for mental and physical health Start working on diet changes to promote healthy weight as well.    DASH Eating Plan DASH stands for "Dietary Approaches to Stop Hypertension." The DASH eating plan is a healthy eating plan that has been shown to reduce high blood pressure (hypertension). It may also reduce your risk for type 2 diabetes, heart disease, and stroke. The DASH eating plan may also help with weight loss. What are tips for following this plan?  General guidelines  Avoid eating more than 2,300 mg (milligrams) of salt (sodium) a day. If you have hypertension, you may need to reduce your sodium intake to 1,500 mg a day.  Limit alcohol intake to no more than 1 drink a day for nonpregnant women and 2 drinks a day for men. One drink equals 12 oz of beer, 5 oz of wine, or 1 oz of hard liquor.  Work with your health care provider to maintain a healthy body weight or to lose weight. Ask what an ideal weight is for you.  Get at least 30 minutes of exercise that causes your heart to beat faster (aerobic exercise) most days of the week. Activities may include walking, swimming, or biking.  Work with your health care provider or diet and nutrition specialist (dietitian) to adjust your eating plan to your individual calorie needs. Reading food labels   Check food labels for the amount of sodium per serving. Choose foods with less than 5 percent of the Daily Value of sodium. Generally, foods with less than 300 mg of sodium per serving fit into this eating plan.  To find whole grains, look for the word "whole" as the first word in the ingredient list. Shopping  Buy products labeled as "low-sodium" or "no salt added."  Buy fresh foods. Avoid canned foods and premade or frozen meals. Cooking  Avoid adding salt when cooking. Use salt-free seasonings or herbs instead of table salt or sea  salt. Check with your health care provider or pharmacist before using salt substitutes.  Do not fry foods. Cook foods using healthy methods such as baking, boiling, grilling, and broiling instead.  Cook with heart-healthy oils, such as olive, canola, soybean, or sunflower oil. Meal planning  Eat a balanced diet that includes: ? 5 or more servings of fruits and vegetables each day. At each meal, try to fill half of your plate with fruits and vegetables. ? Up to 6-8 servings of whole grains each day. ? Less than 6 oz of lean meat, poultry, or fish each day. A 3-oz serving of meat is about the same size as a deck of cards. One egg equals 1 oz. ? 2 servings of low-fat dairy each day. ? A serving of nuts, seeds, or beans 5 times each week. ? Heart-healthy fats. Healthy fats called Omega-3 fatty acids are found in foods such as flaxseeds and coldwater fish, like sardines, salmon, and mackerel.  Limit how much you eat of the following: ? Canned or prepackaged foods. ? Food that is high in trans fat, such as fried foods. ? Food that is high in saturated fat, such as fatty meat. ? Sweets, desserts, sugary drinks, and other foods with added sugar. ? Full-fat dairy products.  Do not salt foods before eating.  Try to eat at least 2 vegetarian meals each week.  Eat more home-cooked food and less restaurant, buffet, and fast food.  When eating at a restaurant, ask that your food be prepared with less salt or no salt, if possible. What foods are recommended? The items listed may not be a complete list. Talk with your dietitian about what dietary choices are best for you. Grains Whole-grain or whole-wheat bread. Whole-grain or whole-wheat pasta. Brown rice. Modena Morrow. Bulgur. Whole-grain and low-sodium cereals. Pita bread. Low-fat, low-sodium crackers. Whole-wheat flour tortillas. Vegetables Fresh or frozen vegetables (raw, steamed, roasted, or grilled). Low-sodium or reduced-sodium tomato  and vegetable juice. Low-sodium or reduced-sodium tomato sauce and tomato paste. Low-sodium or reduced-sodium canned vegetables. Fruits All fresh, dried, or frozen fruit. Canned fruit in natural juice (without added sugar). Meat and other protein foods Skinless chicken or [place]. Ground chicken or [place]. Pork with fat trimmed off. Fish and seafood. Egg whites. Dried beans, peas, or lentils. Unsalted nuts, nut butters, and seeds. Unsalted canned beans. Lean cuts of beef with fat trimmed off. Low-sodium, lean deli meat. Dairy Low-fat (1%) or fat-free (skim) milk. Fat-free, low-fat, or reduced-fat cheeses. Nonfat, low-sodium ricotta or cottage cheese. Low-fat or nonfat yogurt. Low-fat, low-sodium cheese. Fats and oils Soft margarine without trans fats. Vegetable oil. Low-fat, reduced-fat, or light mayonnaise and salad dressings (reduced-sodium). Canola, safflower, olive, soybean, and sunflower oils. Avocado. Seasoning and other foods Herbs. Spices. Seasoning mixes without salt. Unsalted popcorn and pretzels. Fat-free sweets. What foods are not recommended? The items listed may not be a complete list. Talk with your dietitian about what dietary choices are best for you. Grains Baked goods made with fat, such as croissants, muffins, or some breads. Dry pasta or rice meal packs. Vegetables Creamed or fried vegetables. Vegetables in a cheese sauce. Regular canned vegetables (not low-sodium or reduced-sodium). Regular canned tomato sauce and paste (not low-sodium or reduced-sodium). Regular tomato and vegetable juice (not low-sodium or reduced-sodium). Angie Fava. Olives. Fruits Canned fruit in a light or heavy syrup. Fried fruit. Fruit in cream or butter sauce. Meat and other protein foods Fatty cuts of meat. Ribs. Fried meat. Berniece Salines. Sausage. Bologna and other processed lunch meats. Salami. Fatback. Hotdogs. Bratwurst. Salted nuts and seeds. Canned beans with added salt. Canned or smoked fish. Whole eggs  or egg yolks. Chicken or [place] with skin. Dairy Whole or 2% milk, cream, and half-and-half. Whole or full-fat cream cheese. Whole-fat or sweetened yogurt. Full-fat cheese. Nondairy creamers. Whipped toppings. Processed cheese and cheese spreads. Fats and oils Butter. Stick margarine. Lard. Shortening. Ghee. Bacon fat. Tropical oils, such as coconut, palm kernel, or palm oil. Seasoning and other foods Salted popcorn and pretzels. Onion salt, garlic salt, seasoned salt, table salt, and sea salt. Worcestershire sauce. Tartar sauce. Barbecue sauce. Teriyaki sauce. Soy sauce, including reduced-sodium. Steak sauce. Canned and packaged gravies. Fish sauce. Oyster sauce. Cocktail sauce. Horseradish that you find on the shelf. Ketchup. Mustard. Meat flavorings and tenderizers. Bouillon cubes. Hot sauce and Tabasco sauce. Premade or packaged marinades. Premade or packaged taco seasonings. Relishes. Regular salad dressings. Where to find more information:  National Heart, Lung, and [place]: https://wilson-eaton.com/  American Heart Association: www.heart.org Summary  The DASH eating plan is a healthy eating plan that has been shown to reduce high blood pressure (hypertension). It may also reduce your risk for type 2 diabetes, heart disease, and stroke.  With the DASH eating plan, you should limit salt (sodium) intake to 2,300 mg a day. If you have hypertension, you may need to reduce your sodium intake to 1,500 mg a day.  When on the DASH eating plan,  aim to eat more fresh fruits and vegetables, whole grains, lean proteins, low-fat dairy, and heart-healthy fats.  Work with your health care provider or diet and nutrition specialist (dietitian) to adjust your eating plan to your individual calorie needs. This information is not intended to replace advice given to you by your health care provider. Make sure you discuss any questions you have with your health care provider. Document Released: 03/13/2011  Document Revised: 03/17/2016 Document Reviewed: 03/17/2016 Elsevier Interactive Patient Education  2019 [employer].

## 2018-08-05 NOTE — Progress Notes (Signed)
    This service is provided via telemedicine  No vital signs collected/recorded due to the encounter was a telemedicine visit.   Location of patient (ex: home, work): Home  Patient consents to a telephone visit:  Yes  Location of the provider (ex: office, home):  [facility], Office   Name of any referring provider:  N/A  Names of all persons participating in the telemedicine service and their role in the encounter:  S.Chrae B/CMA, [name], NP, and Patient   Time spent on call: 15 min with medical assistant  

## 2018-08-06 ENCOUNTER — Other Ambulatory Visit: Payer: Self-pay | Admitting: Nurse Practitioner

## 2018-08-06 DIAGNOSIS — I10 Essential (primary) hypertension: Secondary | ICD-10-CM

## 2018-08-09 ENCOUNTER — Encounter: Payer: Medicare Other | Admitting: Nurse Practitioner

## 2018-08-09 ENCOUNTER — Ambulatory Visit: Payer: Medicare Other | Admitting: Nurse Practitioner

## 2018-08-10 ENCOUNTER — Encounter: Payer: Medicare Other | Admitting: Nurse Practitioner

## 2018-08-10 ENCOUNTER — Ambulatory Visit: Payer: Medicare Other | Admitting: Nurse Practitioner

## 2018-10-11 ENCOUNTER — Other Ambulatory Visit: Payer: Self-pay | Admitting: *Deleted

## 2018-10-11 DIAGNOSIS — I10 Essential (primary) hypertension: Secondary | ICD-10-CM

## 2018-10-11 MED ORDER — METOPROLOL SUCCINATE ER 50 MG PO TB24: 50.0000 mg | ORAL_TABLET | Freq: Every day | ORAL | 1 refills | Status: DC

## 2018-10-11 NOTE — Telephone Encounter (Signed)
Patient requested refill. [facility].

## 2018-10-28 ENCOUNTER — Telehealth: Payer: Self-pay | Admitting: Nurse Practitioner

## 2018-10-28 NOTE — Telephone Encounter (Signed)
Patient states that she had been in the past referred  by Dr. Mariea Clonts to orthopedic doctor for knee and shoulder pain.  She can't remember the doctor's name.  She is going out of town and wants to see the orthopedic doctor for injection.  Will you please call her with provider's name?

## 2018-10-28 NOTE — Telephone Encounter (Signed)
Patient has seen [facility] Patient notified and number given.

## 2018-11-01 ENCOUNTER — Other Ambulatory Visit: Payer: Self-pay

## 2018-11-01 ENCOUNTER — Encounter: Payer: Self-pay | Admitting: Family Medicine

## 2018-11-01 ENCOUNTER — Ambulatory Visit (INDEPENDENT_AMBULATORY_CARE_PROVIDER_SITE_OTHER): Payer: Medicare Other | Admitting: Family Medicine

## 2018-11-01 ENCOUNTER — Other Ambulatory Visit: Payer: Medicare Other

## 2018-11-01 DIAGNOSIS — G8929 Other chronic pain: Secondary | ICD-10-CM

## 2018-11-01 DIAGNOSIS — R739 Hyperglycemia, unspecified: Secondary | ICD-10-CM

## 2018-11-01 DIAGNOSIS — M1712 Unilateral primary osteoarthritis, left knee: Secondary | ICD-10-CM | POA: Diagnosis not present

## 2018-11-01 DIAGNOSIS — M1711 Unilateral primary osteoarthritis, right knee: Secondary | ICD-10-CM | POA: Diagnosis not present

## 2018-11-01 DIAGNOSIS — E782 Mixed hyperlipidemia: Secondary | ICD-10-CM

## 2018-11-01 DIAGNOSIS — M25511 Pain in right shoulder: Secondary | ICD-10-CM

## 2018-11-01 NOTE — Progress Notes (Signed)
Office Visit Note   Patient: April [place]iddleRose Hill           Date of Birth: 11/28/1940           MRN: [id number] Visit Date: 11/01/2018 Requested by: Sharon SellerEubanks, Jessica K, NP [address] [place] [id number] PCP: Sharon SellerEubanks, Jessica K, NP  Subjective: Chief Complaint  Patient presents with  . Right Knee - Pain    Requesting cortisone injections in both knees and right shoulder. Flying to [place] in 5 days to visit family.  . Left Knee - Pain  . Right Shoulder - Pain    HPI: She is here with right shoulder and bilateral knee pain.  Chronic pain in all of these joints.  Osteoarthritis right is in both knees.  She wants to avoid knee replacement as long as possible.  She is getting ready to [place] and would like cortisone injections.  Her last injections were in 2019 and took the edge off her pain.               ROS: No fevers or chills.  All other systems were reviewed and are negative.  Objective: Vital Signs: There were no vitals taken for this visit.  Physical Exam:  General:  Alert and oriented, in no acute distress. Pulm:  Breathing unlabored. Psy:  Normal mood, congruent affect. Skin: No rash on her skin. Right shoulder: Limited active range of motion of her shoulder with overhead reach.  Isometric rotator cuff strength still 5/5 but she does have some pain with empty can test. Knees: Trace effusion in both knees, no warmth erythema.  3+ patellofemoral crepitus in both knees.  Slight flexion contracture of both knees, flexion motion of about 120 degrees.   Imaging: None today.  Assessment & Plan: 1.  Chronic right shoulder pain with impingement and osteoarthritis -Subacromial injection today.  Could contemplate intra-articular injection in the future if this is only temporary for her.  2.  Bilateral knee osteoarthritis -Steroid injections today.  We will request approval for gel injections to be done under ultrasound guidance in the future.      Procedures: Right shoulder subacromial injection: After sterile prep with Betadine, injected 5 cc 1% lidocaine without epinephrine and 40 mg methylprednisolone from posterior approach into subacromial space.  Bilateral knee injections: After sterile prep with Betadine, injected 3 cc 1% lidocaine without epinephrine and 40 mg methylprednisolone from superolateral approach right knee, and superomedial approach left knee.   PMFS History: Patient Active Problem List   Diagnosis Date Noted  . Elevated alkaline phosphatase level 09/22/2017  . Hypertension   . Hyperlipemia   . Anxiety and depression   . Osteoarthritis   . Irritable bowel syndrome with diarrhea 11/05/2016  . History of colon polyps 11/05/2016  . Abnormal nuclear stress test 08/05/2013  . Vitamin D deficiency 03/11/2011  . Obesity 03/11/2011  . Gastro-esophageal reflux disease without esophagitis 03/11/2011  . Abnormal glucose 03/11/2011   Past Medical History:  Diagnosis Date  . Anxiety and depression   . Cataract   . 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 GERD (gastroesophageal reflux disease)   . Hyperlipemia   . Hypertension   . Osteoarthritis   . Thyroid disease     Family History  Problem Relation Age of Onset  . Hypertension Mother   . Heart disease Mother   . Heart attack Father [id number]  . Heart disease Father   .  Heart attack Sister   . Arthritis Sister   . Diabetes Brother     Past Surgical History:  Procedure Laterality Date  . ABDOMINAL HYSTERECTOMY  1978  . BREAST LUMPECTOMY  1980  . SALIVARY STONE REMOVAL  1980's  . TONSILLECTOMY  1950's   Social History   Occupational History  . Not on file  Tobacco Use  . Smoking status: Never Smoker  . Smokeless tobacco: Never Used  Substance and Sexual Activity  . Alcohol use: No    Frequency: Never  . Drug use: No  . Sexual activity: Not Currently

## 2018-11-02 LAB — COMPLETE METABOLIC PANEL WITH GFR
AG Ratio: 1.6 (calc) (ref 1.0–2.5)
ALT: 13 U/L (ref 6–29)
AST: 16 U/L (ref 10–35)
Albumin: 3.9 g/dL (ref 3.6–5.1)
Alkaline phosphatase (APISO): 143 U/L (ref 37–153)
BUN: 17 mg/dL (ref 7–25)
CO2: 27 mmol/L (ref 20–32)
Calcium: 9.7 mg/dL (ref 8.6–10.4)
Chloride: 106 mmol/L (ref 98–110)
Creat: 0.73 mg/dL (ref 0.60–0.93)
GFR, Est African American: 91 mL/min/{1.73_m2} (ref >=60)
GFR, Est Non African American: 78 mL/min/{1.73_m2} (ref >=60)
Globulin: 2.4 g/dL (calc) (ref 1.9–3.7)
Glucose, Bld: 117 mg/dL — ABNORMAL HIGH (ref 65–99)
Potassium: 4.9 mmol/L (ref 3.5–5.3)
Sodium: 142 mmol/L (ref 135–146)
Total Bilirubin: 0.6 mg/dL (ref 0.2–1.2)
Total Protein: 6.3 g/dL (ref 6.1–8.1)

## 2018-11-02 LAB — HEMOGLOBIN A1C
Hgb A1c MFr Bld: 6.1 % of total Hgb — ABNORMAL HIGH (ref <5.7)
Mean Plasma Glucose: 128 (calc)
eAG (mmol/L): 7.1 (calc)

## 2018-11-02 LAB — LIPID PANEL
Cholesterol: 158 mg/dL (ref <200)
HDL: 49 mg/dL — ABNORMAL LOW (ref >=50)
LDL Cholesterol (Calc): 79 mg/dL (calc)
Non-HDL Cholesterol (Calc): 109 mg/dL (calc) (ref <130)
Total CHOL/HDL Ratio: 3.2 (calc) (ref <5.0)
Triglycerides: 201 mg/dL — ABNORMAL HIGH (ref <150)

## 2018-11-04 ENCOUNTER — Encounter: Payer: Self-pay | Admitting: Nurse Practitioner

## 2018-11-04 ENCOUNTER — Ambulatory Visit (INDEPENDENT_AMBULATORY_CARE_PROVIDER_SITE_OTHER): Payer: Medicare Other | Admitting: Nurse Practitioner

## 2018-11-04 ENCOUNTER — Other Ambulatory Visit: Payer: Self-pay

## 2018-11-04 VITALS — BP 126/78 | HR 65 | Temp 98.3°F | Ht 64.0 in | Wt 208.0 lb

## 2018-11-04 DIAGNOSIS — R748 Abnormal levels of other serum enzymes: Secondary | ICD-10-CM

## 2018-11-04 DIAGNOSIS — F329 Major depressive disorder, single episode, unspecified: Secondary | ICD-10-CM

## 2018-11-04 DIAGNOSIS — E2839 Other primary ovarian failure: Secondary | ICD-10-CM

## 2018-11-04 DIAGNOSIS — M150 Primary generalized (osteo)arthritis: Secondary | ICD-10-CM

## 2018-11-04 DIAGNOSIS — R739 Hyperglycemia, unspecified: Secondary | ICD-10-CM | POA: Diagnosis not present

## 2018-11-04 DIAGNOSIS — I10 Essential (primary) hypertension: Secondary | ICD-10-CM

## 2018-11-04 DIAGNOSIS — E782 Mixed hyperlipidemia: Secondary | ICD-10-CM | POA: Diagnosis not present

## 2018-11-04 DIAGNOSIS — K219 Gastro-esophageal reflux disease without esophagitis: Secondary | ICD-10-CM | POA: Diagnosis not present

## 2018-11-04 DIAGNOSIS — E6609 Other obesity due to excess calories: Secondary | ICD-10-CM

## 2018-11-04 DIAGNOSIS — Z6837 Body mass index (BMI) 37.0-37.9, adult: Secondary | ICD-10-CM | POA: Diagnosis not present

## 2018-11-04 DIAGNOSIS — F419 Anxiety disorder, unspecified: Secondary | ICD-10-CM

## 2018-11-04 DIAGNOSIS — F32A Depression, unspecified: Secondary | ICD-10-CM

## 2018-11-04 DIAGNOSIS — M159 Polyosteoarthritis, unspecified: Secondary | ICD-10-CM

## 2018-11-04 DIAGNOSIS — Z6835 Body mass index (BMI) 35.0-35.9, adult: Secondary | ICD-10-CM

## 2018-11-04 NOTE — Progress Notes (Signed)
Careteam: Patient Care Team: Lauree Chandler, NP as PCP - General (Geriatric Medicine)  Advanced Directive information    Allergies  Allergen Reactions  . Amoxicillin Diarrhea    Chief Complaint  Patient presents with  . Medical Management of Chronic Issues    3 month follow-up, patient c/o fatigue, patient is feeling down, range of motion in shoulders, and hearing concerns. Here with daughter, Olivia Mackie   . Immunizations    Patient intends to get shingrix, will check if in stock at local pharmacy      HPI: Patient is a 78 y.o. female seen in the office today for routine follow up.  Feels like there are some days she feels low but other times she does better- being coped up inside is effecting her.  Taking cymbalta 60 mg daily.  Seeing friends on facebook, family visiting occasionally.  News making it worse for her.   GERD- continues on nexium with good effects, no breakthough pain.  htn-Taking lisinopril hctz and metoprolol   OA bilateral knees and shoulders- using glucosamine-chondroitin an turmeric and using using tylenol twice daily. Pain is tolerable. No pain when sitting.  Pain limits activity. Losing ROM to knees and arms. Has injections to shoulder and limited ROM, daughter would like her to have physical therapy- daughters friend has a mobile unit and would like her to come to the house to help her.    Stress incontinence- using pads    Hyperlipidemia- triglycerides elevated, pt wanting to make dietary modifications at this time. Continues on lipitor.   Hyperglycemia- A1c 6.1   Osteoporosis- noted from old medical records, pt states she was not aware.   Review of Systems:  Review of Systems  Constitutional: Negative for chills, fever and malaise/fatigue.  HENT: Positive for hearing loss.   Eyes: Negative for blurred vision.       Glasses  Respiratory: Negative for cough and shortness of breath.   Cardiovascular: Negative for chest pain, palpitations  and leg swelling.  Gastrointestinal: Negative for abdominal pain, blood in stool, constipation, diarrhea, heartburn, melena, nausea and vomiting.  Genitourinary: Negative for dysuria.  Musculoskeletal: Positive for joint pain. Negative for falls and myalgias.  Skin: Negative for itching and rash.  Neurological: Negative for dizziness, sensory change and loss of consciousness.  Endo/Heme/Allergies: Bruises/bleeds easily.  Psychiatric/Behavioral: Negative for depression, memory loss and suicidal ideas.    Past Medical History:  Diagnosis Date  . Anxiety and depression   . Cataract   . 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 GERD (gastroesophageal reflux disease)   . Hyperlipemia   . Hypertension   . Osteoarthritis   . Thyroid disease    Past Surgical History:  Procedure Laterality Date  . ABDOMINAL HYSTERECTOMY  1978  . BREAST LUMPECTOMY  1980  . SALIVARY STONE REMOVAL  1980's  . TONSILLECTOMY  1950's   Social History:   reports that she has never smoked. She has never used smokeless tobacco. She reports that she does not drink alcohol or use drugs.  Family History  Problem Relation Age of Onset  . Hypertension Mother   . Heart disease Mother   . Heart attack Father 69  . Heart disease Father   . Heart attack Sister   . Arthritis Sister   . Diabetes Brother     Medications: Patient's Medications  New Prescriptions   No medications on file  Previous Medications   ACETAMINOPHEN (TYLENOL) 500 MG TABLET  Take 500 mg by mouth 2 (two) times daily.   ASPIRIN EC 81 MG TABLET    Take 81 mg by mouth daily.   ATORVASTATIN (LIPITOR) 10 MG TABLET    Take 1 tablet (10 mg total) by mouth at bedtime.   CHOLECALCIFEROL (VITAMIN D3) 2000 UNITS TABS    Take 1 tablet by mouth daily.   DULOXETINE (CYMBALTA) 60 MG CAPSULE    TAKE 1 CAPSULE BY MOUTH  DAILY   ESOMEPRAZOLE (NEXIUM) 40 MG CAPSULE    Take 1 capsule (40 mg total) by mouth daily at 12 noon.    GLUCOSAMINE-CHONDROITIN 500-400 MG TABLET    Take 1 tablet by mouth 2 (two) times daily.   KRILL OIL (OMEGA-3) 500 MG CAPS    Take 1 capsule by mouth 2 (two) times daily.   LISINOPRIL-HYDROCHLOROTHIAZIDE (PRINZIDE,ZESTORETIC) 10-12.5 MG TABLET    TAKE 1 TABLET BY MOUTH  DAILY   METOPROLOL SUCCINATE (TOPROL-XL) 50 MG 24 HR TABLET    Take 1 tablet (50 mg total) by mouth daily. Take with or immediately following a meal.   PROBIOTIC PRODUCT (PROBIOTIC DAILY PO)    Take 1 tablet by mouth daily.   TURMERIC CURCUMIN 500 MG CAPS    Take 1 capsule by mouth 2 (two) times daily.  Modified Medications   No medications on file  Discontinued Medications   No medications on file    Physical Exam:  Vitals:   11/04/18 1302  BP: 126/78  Pulse: 65  Temp: 98.3 F (36.8 C)  TempSrc: Oral  SpO2: 97%  Weight: 208 lb (94.3 kg)  Height: 5\' 4"  (1.626 m)   Body mass index is 35.7 kg/m. Wt Readings from Last 3 Encounters:  11/04/18 208 lb (94.3 kg)  06/16/18 202 lb 9.6 oz (91.9 kg)  05/10/18 202 lb (91.6 kg)    Physical Exam Vitals signs reviewed.  Constitutional:      Appearance: Normal appearance.  HENT:     Head: Normocephalic and atraumatic.  Cardiovascular:     Rate and Rhythm: Normal rate and regular rhythm.     Pulses: Normal pulses.     Heart sounds: Normal heart sounds.  Pulmonary:     Effort: Pulmonary effort is normal.     Breath sounds: Normal breath sounds.  Abdominal:     General: Bowel sounds are normal.  Musculoskeletal: Normal range of motion.  Skin:    General: Skin is warm and dry.  Neurological:     General: No focal deficit present.     Mental Status: She is alert and oriented to person, place, and time.  Psychiatric:        Mood and Affect: Mood normal.        Behavior: Behavior normal.     Labs reviewed: Basic Metabolic Panel: Recent Labs    01/26/18 0920 05/10/18 1029 11/01/18 0907  NA 142 142 142  K 5.1 4.9 4.9  CL 106 106 106  CO2 31 31 27   GLUCOSE  111* 103* 117*  BUN 18 18 17   CREATININE 0.66 0.66 0.73  CALCIUM 9.6 9.5 9.7   Liver Function Tests: Recent Labs    01/26/18 0920 11/01/18 0907  AST 16 16  ALT 14 13  BILITOT 0.5 0.6  PROT 6.2 6.3   No results for input(s): LIPASE, AMYLASE in the last 8760 hours. No results for input(s): AMMONIA in the last 8760 hours. CBC: Recent Labs    01/26/18 0920 05/10/18 1029  WBC 7.8 8.1  NEUTROABS 3,502 3,086  HGB 14.3 14.1  HCT 42.8 43.0  MCV 89.4 90.9  PLT 305 304   Lipid Panel: Recent Labs    01/26/18 0920 05/10/18 1029 11/01/18 0907  CHOL 150 224* 158  HDL 48* 52 49*  LDLCALC 72 135* 79  TRIG 205* 229* 201*  CHOLHDL 3.1 4.3 3.2   TSH: No results for input(s): TSH in the last 8760 hours. A1C: Lab Results  Component Value Date   HGBA1C 6.1 (H) 11/01/2018     Assessment/Plan 1. Estrogen deficiency - DG Bone Density; Future  2. Body mass index (BMI) of 35.0-35.9 in adult -noted today  3. Class 2 obesity due to excess calories without serious comorbidity with body mass index (BMI) of 37.0 to 37.9 in adult -discussed BMI, dietary modifications and increase in activity as tolerated recommended.   4. Essential hypertension Stable, continues on current regimen   5. Mixed hyperlipidemia -elevated triglycerides, discussed dietary modifications which the pt would like to do before medication adjustments are made. Will continues on lipitor daily with dietary changes.  - Lipid Panel; Future - BASIC METABOLIC PANEL WITH GFR; Future  6. Primary osteoarthritis involving multiple joints Ongoing pain to shoulders and knees limiting ROM and mobility. Would potentially like to participate in PT to increase mobility but not at this time. Recently got cortisone shots that have been very effective.  7. Gastro-esophageal reflux disease without esophagitis Well controlled on nexium.   8. Anxiety and depression Ongoing, some days are worse than others. Recommended to  continue cymbalta and some lifestyle modifications like increasing physical activity and getting outside as tolerates.   9. Elevated alkaline phosphatase level -improved on recent labs.   10. Hyperglycemia -a1c stable. Continues with dietary modification.   Next appt: 4 months with labs prior Zaylin Runco K. Biagio BorgEubanks, AGNP  [facility] [phone]

## 2018-11-04 NOTE — Patient Instructions (Signed)
Let us know if you would like a referral to physical therapy   Heart-Healthy Eating Plan Many factors influence your heart (coronary) health, including eating and exercise habits. Coronary risk increases with abnormal blood fat (lipid) levels. Heart-healthy meal planning includes limiting unhealthy fats, increasing healthy fats, and making other diet and lifestyle changes. What are tips for following this plan? Cooking Cook foods using methods other than frying. Baking, boiling, grilling, and broiling are all good options. Other ways to reduce fat include:  Removing the skin from poultry.  Removing all visible fats from meats.  Steaming vegetables in water or broth. Meal planning   At meals, imagine dividing your plate into fourths: ? Fill one-half of your plate with vegetables and green salads. ? Fill one-fourth of your plate with whole grains. ? Fill one-fourth of your plate with lean protein foods.  Eat 4-5 servings of vegetables per day. One serving equals 1 cup raw or cooked vegetable, or 2 cups raw leafy greens.  Eat 4-5 servings of fruit per day. One serving equals 1 medium whole fruit,  cup dried fruit,  cup fresh, frozen, or canned fruit, or  cup 100% fruit juice.  Eat more foods that contain soluble fiber. Examples include apples, broccoli, carrots, beans, peas, and barley. Aim to get 25-30 g of fiber per day.  Increase your consumption of legumes, nuts, and seeds to 4-5 servings per week. One serving of dried beans or legumes equals  cup cooked, 1 serving of nuts is  cup, and 1 serving of seeds equals 1 tablespoon. Fats  Choose healthy fats more often. Choose monounsaturated and polyunsaturated fats, such as olive and canola oils, flaxseeds, walnuts, almonds, and seeds.  Eat more omega-3 fats. Choose salmon, mackerel, sardines, tuna, flaxseed oil, and ground flaxseeds. Aim to eat fish at least 2 times each week.  Check food labels carefully to identify foods with  trans fats or high amounts of saturated fat.  Limit saturated fats. These are found in animal products, such as meats, butter, and cream. Plant sources of saturated fats include palm oil, palm kernel oil, and coconut oil.  Avoid foods with partially hydrogenated oils in them. These contain trans fats. Examples are stick margarine, some tub margarines, cookies, crackers, and other baked goods.  Avoid fried foods. General information  Eat more home-cooked food and less restaurant, buffet, and fast food.  Limit or avoid alcohol.  Limit foods that are high in starch and sugar.  Lose weight if you are overweight. Losing just 5-10% of your body weight can help your overall health and prevent diseases such as diabetes and heart disease.  Monitor your salt (sodium) intake, especially if you have high blood pressure. Talk with your health care provider about your sodium intake.  Try to incorporate more vegetarian meals weekly. What foods can I eat? Fruits All fresh, canned (in natural juice), or frozen fruits. Vegetables Fresh or frozen vegetables (raw, steamed, roasted, or grilled). Green salads. Grains Most grains. Choose whole wheat and whole grains most of the time. Rice and pasta, including brown rice and pastas made with whole wheat. Meats and other proteins Lean, well-trimmed beef, veal, pork, and lamb. Chicken and [place] without skin. All fish and shellfish. Wild duck, rabbit, pheasant, and venison. Egg whites or low-cholesterol egg substitutes. Dried beans, peas, lentils, and tofu. Seeds and most nuts. Dairy Low-fat or nonfat cheeses, including ricotta and mozzarella. Skim or 1% milk (liquid, powdered, or evaporated). Buttermilk made with low-fat milk. Nonfat or  low-fat yogurt. Fats and oils Non-hydrogenated (trans-free) margarines. Vegetable oils, including soybean, sesame, sunflower, olive, peanut, safflower, corn, canola, and cottonseed. Salad dressings or mayonnaise made with a  vegetable oil. Beverages Water (mineral or sparkling). Coffee and tea. Diet carbonated beverages. Sweets and desserts Sherbet, gelatin, and fruit ice. Small amounts of dark chocolate. Limit all sweets and desserts. Seasonings and condiments All seasonings and condiments. The items listed above may not be a complete list of foods and beverages you can eat. Contact a dietitian for more options. What foods are not recommended? Fruits Canned fruit in heavy syrup. Fruit in cream or butter sauce. Fried fruit. Limit coconut. Vegetables Vegetables cooked in cheese, cream, or butter sauce. Fried vegetables. Grains Breads made with saturated or trans fats, oils, or whole milk. Croissants. Sweet rolls. Donuts. High-fat crackers, such as cheese crackers. Meats and other proteins Fatty meats, such as hot dogs, ribs, sausage, bacon, rib-eye roast or steak. High-fat deli meats, such as salami and bologna. Caviar. Domestic duck and goose. Organ meats, such as liver. Dairy Cream, sour cream, cream cheese, and creamed cottage cheese. Whole milk cheeses. Whole or 2% milk (liquid, evaporated, or condensed). Whole buttermilk. Cream sauce or high-fat cheese sauce. Whole-milk yogurt. Fats and oils Meat fat, or shortening. Cocoa butter, hydrogenated oils, palm oil, coconut oil, palm kernel oil. Solid fats and shortenings, including bacon fat, salt pork, lard, and butter. Nondairy cream substitutes. Salad dressings with cheese or sour cream. Beverages Regular sodas and any drinks with added sugar. Sweets and desserts Frosting. Pudding. Cookies. Cakes. Pies. Milk chocolate or white chocolate. Buttered syrups. Full-fat ice cream or ice cream drinks. The items listed above may not be a complete list of foods and beverages to avoid. Contact a dietitian for more information. Summary  Heart-healthy meal planning includes limiting unhealthy fats, increasing healthy fats, and making other diet and lifestyle changes.   Lose weight if you are overweight. Losing just 5-10% of your body weight can help your overall health and prevent diseases such as diabetes and heart disease.  Focus on eating a balance of foods, including fruits and vegetables, low-fat or nonfat dairy, lean protein, nuts and legumes, whole grains, and heart-healthy oils and fats. This information is not intended to replace advice given to you by your health care provider. Make sure you discuss any questions you have with your health care provider. Document Released: 01/01/2008 Document Revised: 05/01/2017 Document Reviewed: 05/01/2017 Elsevier Patient Education  2020 [employer].

## 2018-11-08 ENCOUNTER — Telehealth: Payer: Self-pay

## 2018-11-08 NOTE — Telephone Encounter (Signed)
Submitted VOB for Monovisc, bilateral knee. 

## 2018-11-13 ENCOUNTER — Other Ambulatory Visit: Payer: Self-pay | Admitting: Internal Medicine

## 2018-11-13 ENCOUNTER — Other Ambulatory Visit: Payer: Self-pay | Admitting: Nurse Practitioner

## 2018-11-13 DIAGNOSIS — F329 Major depressive disorder, single episode, unspecified: Secondary | ICD-10-CM

## 2018-11-13 DIAGNOSIS — E782 Mixed hyperlipidemia: Secondary | ICD-10-CM

## 2018-11-13 DIAGNOSIS — I10 Essential (primary) hypertension: Secondary | ICD-10-CM

## 2018-11-13 DIAGNOSIS — F419 Anxiety disorder, unspecified: Secondary | ICD-10-CM

## 2018-11-15 ENCOUNTER — Telehealth: Payer: Self-pay

## 2018-11-15 NOTE — Telephone Encounter (Signed)
Talked with patient to schedule appointment for gel injection, but patient stated that she would like to wait until December, 2020 to get gel injection.  Will resubmit in Dec. 2020 for Monovisc, bilateral knee.  August-2020 Approved for Monovisc, bilateral knee Buy & Bill Medicare deductible met. Secondary will pick up remaining eligible expenses at 75%. No Co-pay No PA required

## 2018-12-08 DIAGNOSIS — Z23 Encounter for immunization: Secondary | ICD-10-CM | POA: Diagnosis not present

## 2019-01-15 ENCOUNTER — Other Ambulatory Visit: Payer: Self-pay | Admitting: Nurse Practitioner

## 2019-01-15 DIAGNOSIS — E782 Mixed hyperlipidemia: Secondary | ICD-10-CM

## 2019-02-28 ENCOUNTER — Other Ambulatory Visit: Payer: Medicare Other

## 2019-03-07 ENCOUNTER — Ambulatory Visit: Payer: Medicare Other | Admitting: Nurse Practitioner

## 2019-03-14 ENCOUNTER — Other Ambulatory Visit: Payer: Medicare Other

## 2019-03-14 ENCOUNTER — Telehealth: Payer: Self-pay

## 2019-03-14 ENCOUNTER — Other Ambulatory Visit: Payer: Self-pay

## 2019-03-14 DIAGNOSIS — E782 Mixed hyperlipidemia: Secondary | ICD-10-CM

## 2019-03-14 LAB — BASIC METABOLIC PANEL WITH GFR
BUN: 19 mg/dL (ref 7–25)
CO2: 25 mmol/L (ref 20–32)
Calcium: 9.6 mg/dL (ref 8.6–10.4)
Chloride: 106 mmol/L (ref 98–110)
Creat: 0.67 mg/dL (ref 0.60–0.93)
GFR, Est African American: 97 mL/min/{1.73_m2} (ref >=60)
GFR, Est Non African American: 84 mL/min/{1.73_m2} (ref >=60)
Glucose, Bld: 103 mg/dL — ABNORMAL HIGH (ref 65–99)
Potassium: 4.6 mmol/L (ref 3.5–5.3)
Sodium: 141 mmol/L (ref 135–146)

## 2019-03-14 LAB — LIPID PANEL
Cholesterol: 144 mg/dL (ref <200)
HDL: 52 mg/dL (ref >=50)
LDL Cholesterol (Calc): 66 mg/dL (calc)
Non-HDL Cholesterol (Calc): 92 mg/dL (calc) (ref <130)
Total CHOL/HDL Ratio: 2.8 (calc) (ref <5.0)
Triglycerides: 185 mg/dL — ABNORMAL HIGH (ref <150)

## 2019-03-14 NOTE — Telephone Encounter (Signed)
Re-Submitted VOB for Monovisc, bilateral knee.

## 2019-03-18 ENCOUNTER — Ambulatory Visit (INDEPENDENT_AMBULATORY_CARE_PROVIDER_SITE_OTHER): Payer: Medicare Other | Admitting: Nurse Practitioner

## 2019-03-18 ENCOUNTER — Encounter: Payer: Self-pay | Admitting: Nurse Practitioner

## 2019-03-18 ENCOUNTER — Other Ambulatory Visit: Payer: Self-pay

## 2019-03-18 VITALS — BP 130/82 | HR 80 | Temp 97.7°F | Ht 64.0 in | Wt 209.0 lb

## 2019-03-18 DIAGNOSIS — F32A Depression, unspecified: Secondary | ICD-10-CM

## 2019-03-18 DIAGNOSIS — M159 Polyosteoarthritis, unspecified: Secondary | ICD-10-CM

## 2019-03-18 DIAGNOSIS — M8949 Other hypertrophic osteoarthropathy, multiple sites: Secondary | ICD-10-CM

## 2019-03-18 DIAGNOSIS — K580 Irritable bowel syndrome with diarrhea: Secondary | ICD-10-CM

## 2019-03-18 DIAGNOSIS — E6609 Other obesity due to excess calories: Secondary | ICD-10-CM

## 2019-03-18 DIAGNOSIS — K219 Gastro-esophageal reflux disease without esophagitis: Secondary | ICD-10-CM

## 2019-03-18 DIAGNOSIS — Z6837 Body mass index (BMI) 37.0-37.9, adult: Secondary | ICD-10-CM | POA: Diagnosis not present

## 2019-03-18 DIAGNOSIS — I10 Essential (primary) hypertension: Secondary | ICD-10-CM | POA: Diagnosis not present

## 2019-03-18 DIAGNOSIS — M150 Primary generalized (osteo)arthritis: Secondary | ICD-10-CM

## 2019-03-18 DIAGNOSIS — F329 Major depressive disorder, single episode, unspecified: Secondary | ICD-10-CM

## 2019-03-18 DIAGNOSIS — E782 Mixed hyperlipidemia: Secondary | ICD-10-CM | POA: Diagnosis not present

## 2019-03-18 DIAGNOSIS — F419 Anxiety disorder, unspecified: Secondary | ICD-10-CM

## 2019-03-18 DIAGNOSIS — E66812 Obesity, class 2: Secondary | ICD-10-CM

## 2019-03-18 DIAGNOSIS — Z9109 Other allergy status, other than to drugs and biological substances: Secondary | ICD-10-CM

## 2019-03-18 NOTE — Patient Instructions (Signed)
To schedule bone density   To decrease caffeine to 1 cup a day or even less To decrease soda  To start Claritin or zyrtec 10 mg by mouth daily  To start benefiber daily

## 2019-03-18 NOTE — Progress Notes (Signed)
Careteam: Patient Care Team: Sharon SellerEubanks, Nyshawn Gowdy K, NP as PCP - General (Geriatric Medicine)  Advanced Directive information Does Patient Have a Medical Advance Directive?: Yes, Type of Advance Directive: [place];Living will, Does patient want to make changes to medical advance directive?: No - Patient declined  Allergies  Allergen Reactions  . Amoxicillin Diarrhea    Chief Complaint  Patient presents with  . Medical Management of Chronic Issues    4 month follow-up. Patient c/o stomach discomort. Patient would also like ears and throat checked.  Here with daughter French Anaracy   . Advanced Directive    No ACP on file   . Medication Management    Dicsuss medication- Cymbalta   . Medication Refill    Refill medications      HPI: Patient is a 78 y.o. female seen in the office today for routine follow up.   Reports when she shampoos hair her ears crackles  Lots of sinus drainage. No sore throat.   Having a lot of stomach  Problems due to being cooped up and feels like it his her nerves. In the last month having diarrhea. Had not made it to the bathroom 5 times. Having occasional diarrhea. Did not make it to the bathroom twice this week.  Does not feel like it is related to food because she can eat the same thing and is fine.  Today her nerves are not bad, does not really hurt anywhere except knees.  Feels like some days she has the virus and "then she is going to die" then she eats and it goes right through her.  No vomiting. No blood noted in stools or dark tarry stools.  No changes in vitamins or new vitamins.  No recent antibiotics.   Reports she has a lot of stress all day.  Not doing any walking due to her knees.   In may got knee injection- was beneficial but now back to hurting.    Allergies- has a cat- not on a antihistamine. Daughter feels like this is contributing to her symptoms.   Anxiety- on Cymbalta for fibromyalgia and anxiety.  Review of  Systems:  Review of Systems  Constitutional: Negative for chills, fever and weight loss.  HENT: Negative for sore throat and tinnitus.   Respiratory: Negative for cough, sputum production and shortness of breath.   Cardiovascular: Negative for chest pain, palpitations and leg swelling.  Gastrointestinal: Positive for diarrhea. Negative for abdominal pain, constipation and heartburn.  Genitourinary: Negative for dysuria, frequency and urgency.  Musculoskeletal: Negative for back pain, falls, joint pain and myalgias.  Skin: Negative.   Neurological: Negative for dizziness and headaches.  Endo/Heme/Allergies: Positive for environmental allergies.  Psychiatric/Behavioral: Negative for depression and memory loss. The patient is nervous/anxious. The patient does not have insomnia.     Past Medical History:  Diagnosis Date  . Anxiety and depression   . Cataract   . 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 GERD (gastroesophageal reflux disease)   . Hyperlipemia   . Hypertension   . Osteoarthritis   . Thyroid disease    Past Surgical History:  Procedure Laterality Date  . ABDOMINAL HYSTERECTOMY  1978  . BREAST LUMPECTOMY  1980  . SALIVARY STONE REMOVAL  1980's  . TONSILLECTOMY  1950's   Social History:   reports that she has never smoked. She has never used smokeless tobacco. She reports that she does not drink alcohol or use  drugs.  Family History  Problem Relation Age of Onset  . Hypertension Mother   . Heart disease Mother   . Heart attack Father 11  . Heart disease Father   . Heart attack Sister   . Arthritis Sister   . Diabetes Brother     Medications: Patient's Medications  New Prescriptions   No medications on file  Previous Medications   ACETAMINOPHEN (TYLENOL) 500 MG TABLET    Take 500 mg by mouth 2 (two) times daily.   ASPIRIN EC 81 MG TABLET    Take 81 mg by mouth daily.   ATORVASTATIN (LIPITOR) 10 MG TABLET    TAKE 1 TABLET BY MOUTH AT   BEDTIME   CHOLECALCIFEROL (VITAMIN D3) 2000 UNITS TABS    Take 1 tablet by mouth daily.   DULOXETINE (CYMBALTA) 60 MG CAPSULE    TAKE 1 CAPSULE BY MOUTH  DAILY   ESOMEPRAZOLE (NEXIUM) 40 MG CAPSULE    Take 1 capsule (40 mg total) by mouth daily at 12 noon.   GLUCOSAMINE-CHONDROITIN 500-400 MG TABLET    Take 1 tablet by mouth 2 (two) times daily.   KRILL OIL (OMEGA-3) 500 MG CAPS    Take 1 capsule by mouth 2 (two) times daily.   LISINOPRIL-HYDROCHLOROTHIAZIDE (ZESTORETIC) 10-12.5 MG TABLET    TAKE 1 TABLET BY MOUTH  DAILY   METOPROLOL SUCCINATE (TOPROL-XL) 50 MG 24 HR TABLET    Take 1 tablet (50 mg total) by mouth daily. Take with or immediately following a meal.   PROBIOTIC PRODUCT (PROBIOTIC DAILY PO)    Take 1 tablet by mouth daily.   TURMERIC CURCUMIN 500 MG CAPS    Take 1 capsule by mouth 2 (two) times daily.  Modified Medications   No medications on file  Discontinued Medications   No medications on file    Physical Exam:  Vitals:   03/18/19 1300  BP: 130/82  Pulse: 80  Temp: 97.7 F (36.5 C)  TempSrc: Temporal  Weight: 209 lb (94.8 kg)  Height: 5\' 4"  (1.626 m)   Body mass index is 35.87 kg/m. Wt Readings from Last 3 Encounters:  03/18/19 209 lb (94.8 kg)  11/04/18 208 lb (94.3 kg)  06/16/18 202 lb 9.6 oz (91.9 kg)    Physical Exam Constitutional:      General: She is not in acute distress.    Appearance: She is well-developed. She is not diaphoretic.  HENT:     Head: Normocephalic and atraumatic.     Mouth/Throat:     Pharynx: No oropharyngeal exudate.  Eyes:     Conjunctiva/sclera: Conjunctivae normal.     Pupils: Pupils are equal, round, and reactive to light.  Cardiovascular:     Rate and Rhythm: Normal rate and regular rhythm.     Heart sounds: Normal heart sounds.  Pulmonary:     Effort: Pulmonary effort is normal.     Breath sounds: Normal breath sounds.  Abdominal:     General: Bowel sounds are normal.     Palpations: Abdomen is soft.    Musculoskeletal:        General: No tenderness.     Cervical back: Normal range of motion and neck supple.  Skin:    General: Skin is warm and dry.     Findings: No bruising.  Neurological:     Mental Status: She is alert and oriented to person, place, and time.     Labs reviewed: Basic Metabolic Panel: Recent Labs    05/10/18  1029 11/01/18 0907 03/14/19 0931  NA 142 142 141  K 4.9 4.9 4.6  CL 106 106 106  CO2 31 27 25   GLUCOSE 103* 117* 103*  BUN 18 17 19   CREATININE 0.66 0.73 0.67  CALCIUM 9.5 9.7 9.6   Liver Function Tests: Recent Labs    11/01/18 0907  AST 16  ALT 13  BILITOT 0.6  PROT 6.3   No results for input(s): LIPASE, AMYLASE in the last 8760 hours. No results for input(s): AMMONIA in the last 8760 hours. CBC: Recent Labs    05/10/18 1029  WBC 8.1  NEUTROABS 3,086  HGB 14.1  HCT 43.0  MCV 90.9  PLT 304   Lipid Panel: Recent Labs    05/10/18 1029 11/01/18 0907 03/14/19 0931  CHOL 224* 158 144  HDL 52 49* 52  LDLCALC 135* 79 66  TRIG 229* 201* 185*  CHOLHDL 4.3 3.2 2.8   TSH: No results for input(s): TSH in the last 8760 hours. A1C: Lab Results  Component Value Date   HGBA1C 6.1 (H) 11/01/2018     Assessment/Plan 1. Class 2 obesity due to excess calories without serious comorbidity with body mass index (BMI) of 37.0 to 37.9 in adult -noted today, encouraged weight loss through reduced caloric intake and increase in physical activity.   2. Essential hypertension Stable on lisinipril- hctz with metoprolol   3. Mixed hyperlipidemia LDL at goal with elevated triglycerides, enocouraged dietary modifications, continues on lipitor 10 mg daily  4. Primary osteoarthritis involving multiple joints - worse, encouraged follow up with orthopedic as she has gotten injections in the past which have been very beneficial to her.   5. Gastro-esophageal reflux disease without esophagitis Stable, Denies any symptoms of indigestion or acid  reflex. Continues on esomeprazole daily which has worked well for her.   6. Anxiety and depression Worse with COVID and lack of interaction with others. She is also drinking more caffeine which can make anxiety worse so we discussed cutting back on this. Increasing physical activity which is limited by knee pain- encouraged chair exercises and getting outside more on nice days. She will continue on cymbalta. She declines therapy at this time but information provided incase she changes her mind.  7. Irritable bowel syndrome with diarrhea Worsening diarrhea, has changed coffee and drinking more caffeine which could be contributing. Encouraged to decrease caffeine intake as well as sodas -to increase fiber in diet, can add benefiber daily   8. Environmental allergies -ongoing, has cat which daughter feels like is a contributor to her sinus drainage. Encouraged to start Claritin or zyrtec 10 mg daily.   Next appt: 4 months.  14/07/20. 11/03/2018  [facility] [phone]

## 2019-03-24 ENCOUNTER — Telehealth: Payer: Self-pay

## 2019-03-24 NOTE — Telephone Encounter (Signed)
Talked with patient concerning gel injection and she stated that she would like to hold off until next year for gel injection and that she would give [place] a call when she is ready to proceed.

## 2019-05-04 ENCOUNTER — Other Ambulatory Visit: Payer: Self-pay | Admitting: *Deleted

## 2019-05-04 DIAGNOSIS — F329 Major depressive disorder, single episode, unspecified: Secondary | ICD-10-CM

## 2019-05-04 DIAGNOSIS — E782 Mixed hyperlipidemia: Secondary | ICD-10-CM

## 2019-05-04 DIAGNOSIS — I10 Essential (primary) hypertension: Secondary | ICD-10-CM

## 2019-05-04 DIAGNOSIS — F419 Anxiety disorder, unspecified: Secondary | ICD-10-CM

## 2019-05-04 MED ORDER — ATORVASTATIN CALCIUM 10 MG PO TABS: 10.0000 mg | ORAL_TABLET | Freq: Every day | ORAL | 1 refills | Status: DC

## 2019-05-04 MED ORDER — METOPROLOL SUCCINATE ER 50 MG PO TB24: 50.0000 mg | ORAL_TABLET | Freq: Every day | ORAL | 1 refills | Status: DC

## 2019-05-04 MED ORDER — DULOXETINE HCL 60 MG PO CPEP: 60.0000 mg | ORAL_CAPSULE | Freq: Every day | ORAL | 1 refills | Status: DC

## 2019-05-04 MED ORDER — LISINOPRIL-HYDROCHLOROTHIAZIDE 10-12.5 MG PO TABS: 1.0000 | ORAL_TABLET | Freq: Every day | ORAL | 1 refills | Status: DC

## 2019-07-14 LAB — CBC: RBC: 5.18 — AB (ref 3.87–5.11)

## 2019-07-14 LAB — CBC AND DIFFERENTIAL
HCT: 48 — AB (ref 36–46)
Hemoglobin: 15.2 (ref 12.0–16.0)
Platelets: 304 (ref 150–399)
WBC: 10.3

## 2019-07-20 ENCOUNTER — Encounter: Payer: Self-pay | Admitting: Nurse Practitioner

## 2019-07-20 ENCOUNTER — Other Ambulatory Visit: Payer: Self-pay

## 2019-07-20 ENCOUNTER — Ambulatory Visit (INDEPENDENT_AMBULATORY_CARE_PROVIDER_SITE_OTHER): Payer: Medicare HMO | Admitting: Nurse Practitioner

## 2019-07-20 VITALS — BP 136/80 | HR 55 | Temp 97.3°F | Ht 64.0 in | Wt 205.8 lb

## 2019-07-20 DIAGNOSIS — R5383 Other fatigue: Secondary | ICD-10-CM | POA: Diagnosis not present

## 2019-07-20 DIAGNOSIS — M159 Polyosteoarthritis, unspecified: Secondary | ICD-10-CM

## 2019-07-20 DIAGNOSIS — M8949 Other hypertrophic osteoarthropathy, multiple sites: Secondary | ICD-10-CM

## 2019-07-20 DIAGNOSIS — K580 Irritable bowel syndrome with diarrhea: Secondary | ICD-10-CM

## 2019-07-20 DIAGNOSIS — F32A Depression, unspecified: Secondary | ICD-10-CM

## 2019-07-20 DIAGNOSIS — F329 Major depressive disorder, single episode, unspecified: Secondary | ICD-10-CM

## 2019-07-20 DIAGNOSIS — J302 Other seasonal allergic rhinitis: Secondary | ICD-10-CM

## 2019-07-20 DIAGNOSIS — F419 Anxiety disorder, unspecified: Secondary | ICD-10-CM | POA: Diagnosis not present

## 2019-07-20 DIAGNOSIS — E782 Mixed hyperlipidemia: Secondary | ICD-10-CM | POA: Diagnosis not present

## 2019-07-20 DIAGNOSIS — E66812 Obesity, class 2: Secondary | ICD-10-CM

## 2019-07-20 DIAGNOSIS — I10 Essential (primary) hypertension: Secondary | ICD-10-CM

## 2019-07-20 DIAGNOSIS — M150 Primary generalized (osteo)arthritis: Secondary | ICD-10-CM

## 2019-07-20 DIAGNOSIS — Z6835 Body mass index (BMI) 35.0-35.9, adult: Secondary | ICD-10-CM

## 2019-07-20 NOTE — Patient Instructions (Addendum)
To start Claritin 10 mg daily for allergies  To call orthopedic for knee injection

## 2019-07-20 NOTE — Progress Notes (Signed)
Careteam: Patient Care Team: Lauree Chandler, NP as PCP - General (Geriatric Medicine)  PLACE OF SERVICE:  [place] Directive information Does Patient Have a Medical Advance Directive?: Yes, Type of Advance Directive: [place];Living will  Allergies  Allergen Reactions  . Amoxicillin Diarrhea    Chief Complaint  Patient presents with  . Medical Management of Chronic Issues    4 month follow-up. Here with daughter Olivia Mackie.   . Fatigue    Patient with fatigue, doesn't fell good overall, stomach upset, drainage in throat(allergies), hurts all over, and SOB.      HPI: Patient is a 79 y.o. female for routine follow up   Reports she is hurting and feeling fatigue. She went to the grocery store alone yesterday and got way too many groceries and then afterwards felt awful and was very fatigue. Shortness of breath with increase in activity. In a clinical trail and last week walked for 6 mins and did great. No shortness of breath. No chest pain. When they checked her inspiratory pressures she reports she was normal.    Having a lot of drainage due to the pollen.  Father had a MI in this 27s.   Sits around the house most of the day but does her chores. She walks around 45 to an hour in [employer] or the store. Normally will get fatigue but over the last few weeks has had more generalized fatigue. "does not feel good" sometimes wants her husband back and that gets her upset and then wants to get rid of all her pain and that upsets her.   Reports she feels good day.   OA- ongoing- talked about going to the orthopedic but has not been in quite a while. Now having worsening pain.   IBS-D- with Fecal incontinence- will occasionally have diarrhea. Attempted to increase fiber but did not like the way it made her feel.   Worry a lot about the time, about the country about her children.  Has physical pain as well.   Review of Systems:  Review of Systems   Constitutional: Positive for malaise/fatigue. Negative for chills, fever and weight loss.  HENT: Negative for tinnitus.   Respiratory: Positive for shortness of breath (with activity). Negative for cough and sputum production.   Cardiovascular: Negative for chest pain, palpitations and leg swelling.  Gastrointestinal: Positive for diarrhea. Negative for abdominal pain, constipation and heartburn.  Genitourinary: Negative for dysuria, frequency and urgency.  Musculoskeletal: Positive for back pain and joint pain. Negative for falls and myalgias.  Skin: Negative.   Neurological: Negative for dizziness and headaches.  Psychiatric/Behavioral: Positive for depression. Negative for memory loss. The patient is nervous/anxious. The patient does not have insomnia.    Past Medical History:  Diagnosis Date  . Anxiety and depression   . Cataract   . 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 GERD (gastroesophageal reflux disease)   . Hyperlipemia   . Hypertension   . Osteoarthritis   . Thyroid disease    Past Surgical History:  Procedure Laterality Date  . ABDOMINAL HYSTERECTOMY  1978  . BREAST LUMPECTOMY  1980  . SALIVARY STONE REMOVAL  1980's  . TONSILLECTOMY  1950's   Social History:   reports that she has never smoked. She has never used smokeless tobacco. She reports that she does not drink alcohol or use drugs.  Family History  Problem Relation Age of Onset  .  Hypertension Mother   . Heart disease Mother   . Heart attack Father 46  . Heart disease Father   . Heart attack Sister   . Arthritis Sister   . Diabetes Brother     Medications: Patient's Medications  New Prescriptions   No medications on file  Previous Medications   ACETAMINOPHEN (TYLENOL) 500 MG TABLET    Take 500 mg by mouth 2 (two) times daily.   ASPIRIN EC 81 MG TABLET    Take 81 mg by mouth daily.   ATORVASTATIN (LIPITOR) 10 MG TABLET    Take 1 tablet (10 mg total) by mouth at bedtime.    CHOLECALCIFEROL (VITAMIN D3) 2000 UNITS TABS    Take 1 tablet by mouth daily.   DULOXETINE (CYMBALTA) 60 MG CAPSULE    Take 1 capsule (60 mg total) by mouth daily.   ESOMEPRAZOLE (NEXIUM) 40 MG CAPSULE    Take 1 capsule (40 mg total) by mouth daily at 12 noon.   GLUCOSAMINE-CHONDROITIN 500-400 MG TABLET    Take 1 tablet by mouth 2 (two) times daily.   KRILL OIL (OMEGA-3) 500 MG CAPS    Take 1 capsule by mouth 2 (two) times daily.   LISINOPRIL-HYDROCHLOROTHIAZIDE (ZESTORETIC) 10-12.5 MG TABLET    Take 1 tablet by mouth daily.   METOPROLOL SUCCINATE (TOPROL-XL) 50 MG 24 HR TABLET    Take 1 tablet (50 mg total) by mouth daily. Take with or immediately following a meal.   PROBIOTIC PRODUCT (PROBIOTIC DAILY PO)    Take 1 tablet by mouth daily.   TURMERIC CURCUMIN 500 MG CAPS    Take 1 capsule by mouth 2 (two) times daily.  Modified Medications   No medications on file  Discontinued Medications   No medications on file    Physical Exam:  Vitals:   07/20/19 1313  BP: 136/80  Pulse: (!) 55  Temp: (!) 97.3 F (36.3 C)  TempSrc: Temporal  SpO2: 98%  Weight: 205 lb 12.8 oz (93.4 kg)  Height: 5' 4"  (1.626 m)   Body mass index is 35.33 kg/m. Wt Readings from Last 3 Encounters:  07/20/19 205 lb 12.8 oz (93.4 kg)  03/18/19 209 lb (94.8 kg)  11/04/18 208 lb (94.3 kg)    Physical Exam Constitutional:      General: She is not in acute distress.    Appearance: She is well-developed. She is not diaphoretic.  HENT:     Head: Normocephalic and atraumatic.     Mouth/Throat:     Pharynx: No oropharyngeal exudate.  Eyes:     Conjunctiva/sclera: Conjunctivae normal.     Pupils: Pupils are equal, round, and reactive to light.  Cardiovascular:     Rate and Rhythm: Normal rate and regular rhythm.     Heart sounds: Normal heart sounds.  Pulmonary:     Effort: Pulmonary effort is normal.     Breath sounds: Normal breath sounds.  Abdominal:     General: Bowel sounds are normal.     Palpations:  Abdomen is soft.  Musculoskeletal:        General: No tenderness.     Cervical back: Normal range of motion and neck supple.  Skin:    General: Skin is warm and dry.  Neurological:     Mental Status: She is alert and oriented to person, place, and time.  Psychiatric:        Mood and Affect: Affect is tearful.     Labs reviewed: Basic Metabolic Panel: Recent  Labs    11/01/18 0907 03/14/19 0931  NA 142 141  K 4.9 4.6  CL 106 106  CO2 27 25  GLUCOSE 117* 103*  BUN 17 19  CREATININE 0.73 0.67  CALCIUM 9.7 9.6   Liver Function Tests: Recent Labs    11/01/18 0907  AST 16  ALT 13  BILITOT 0.6  PROT 6.3   No results for input(s): LIPASE, AMYLASE in the last 8760 hours. No results for input(s): AMMONIA in the last 8760 hours. CBC: No results for input(s): WBC, NEUTROABS, HGB, HCT, MCV, PLT in the last 8760 hours. Lipid Panel: Recent Labs    11/01/18 0907 03/14/19 0931  CHOL 158 144  HDL 49* 52  LDLCALC 79 66  TRIG 201* 185*  CHOLHDL 3.2 2.8   TSH: No results for input(s): TSH in the last 8760 hours. A1C: Lab Results  Component Value Date   HGBA1C 6.1 (H) 11/01/2018     Assessment/Plan 1. Essential hypertension -blood pressure controlled on lisinopril-hctz and Toprol 50 mg daily. To continue medication with lifestyle modifications. - CMP with eGFR([facility]) -EKG updated due to fatigued and family hx, consistent with previous reading. SR rate 65  2. Primary osteoarthritis involving multiple joints Worse in knee, making it difficult for her to walk. Encouraged follow up with ortho for injection to help with pain management.  3. Fatigue, unspecified type Likely multifactorial due to depression, anxiety, obesity and debility. reports study she is a part of test lung function and has done imaging all within normal finding. Will update labs today.  - TSH - CMP with eGFR([facility])  4. Mixed hyperlipidemia Continues on lipitor, question if this could be adding to  her myalgias, will have her stop and follow up in 3 weeks to see if fatigue/malagies has improved.   5. Seasonal allergies -could be contributing to some of her symptoms. Will add OTC Claritin 10 mg by mouth daily   6. Anxiety and depression Ongoing, Stressed importance of counseling or therapy in combination with medication. Continues on cymbalta 60 mg   7. Irritable bowel syndrome with diarrhea Stable at this time.  8. Class 2 severe obesity due to excess calories with serious comorbidity and body mass index (BMI) of 35.0 to 35.9 in adult [facility]) Encouraged weight loss through diet modifications and increase in physical activity- this is limited due to knee pain. Discussed how weight loss would have positive benefits on overall health.   Next appt: 3 weeks for MOST/myalgias and AWV  Nasif Bos K. Anthony, [facility] [phone]

## 2019-07-21 LAB — COMPLETE METABOLIC PANEL WITH GFR
AG Ratio: 1.8 (calc) (ref 1.0–2.5)
ALT: 14 U/L (ref 6–29)
AST: 19 U/L (ref 10–35)
Albumin: 4.2 g/dL (ref 3.6–5.1)
Alkaline phosphatase (APISO): 155 U/L — ABNORMAL HIGH (ref 37–153)
BUN: 17 mg/dL (ref 7–25)
CO2: 26 mmol/L (ref 20–32)
Calcium: 9.8 mg/dL (ref 8.6–10.4)
Chloride: 103 mmol/L (ref 98–110)
Creat: 0.75 mg/dL (ref 0.60–0.93)
GFR, Est African American: 88 mL/min/{1.73_m2} (ref >=60)
GFR, Est Non African American: 76 mL/min/{1.73_m2} (ref >=60)
Globulin: 2.3 g/dL (calc) (ref 1.9–3.7)
Glucose, Bld: 107 mg/dL (ref 65–139)
Potassium: 4.3 mmol/L (ref 3.5–5.3)
Sodium: 139 mmol/L (ref 135–146)
Total Bilirubin: 0.7 mg/dL (ref 0.2–1.2)
Total Protein: 6.5 g/dL (ref 6.1–8.1)

## 2019-07-21 LAB — TSH: TSH: 0.9 mIU/L (ref 0.40–4.50)

## 2019-08-03 ENCOUNTER — Encounter: Payer: Self-pay | Admitting: Nurse Practitioner

## 2019-08-08 ENCOUNTER — Ambulatory Visit (INDEPENDENT_AMBULATORY_CARE_PROVIDER_SITE_OTHER): Payer: Medicare HMO | Admitting: Nurse Practitioner

## 2019-08-08 ENCOUNTER — Encounter: Payer: Self-pay | Admitting: Nurse Practitioner

## 2019-08-08 ENCOUNTER — Other Ambulatory Visit: Payer: Self-pay

## 2019-08-08 VITALS — BP 128/72 | HR 62 | Temp 97.3°F | Ht 64.0 in | Wt 208.0 lb

## 2019-08-08 VITALS — BP 124/78 | HR 62 | Temp 97.3°F | Ht 64.0 in | Wt 208.2 lb

## 2019-08-08 DIAGNOSIS — E782 Mixed hyperlipidemia: Secondary | ICD-10-CM | POA: Diagnosis not present

## 2019-08-08 DIAGNOSIS — M8949 Other hypertrophic osteoarthropathy, multiple sites: Secondary | ICD-10-CM

## 2019-08-08 DIAGNOSIS — Z0000 Encounter for general adult medical examination without abnormal findings: Secondary | ICD-10-CM | POA: Diagnosis not present

## 2019-08-08 DIAGNOSIS — J302 Other seasonal allergic rhinitis: Secondary | ICD-10-CM | POA: Diagnosis not present

## 2019-08-08 DIAGNOSIS — M159 Polyosteoarthritis, unspecified: Secondary | ICD-10-CM

## 2019-08-08 DIAGNOSIS — R5383 Other fatigue: Secondary | ICD-10-CM

## 2019-08-08 MED ORDER — ROSUVASTATIN CALCIUM 5 MG PO TABS: ORAL_TABLET | ORAL | 1 refills | Status: DC

## 2019-08-08 NOTE — Patient Instructions (Signed)
April Hill , Thank you for taking time to come for your Medicare Wellness Visit. I appreciate your ongoing commitment to your health goals. Please review the following plan we discussed and let me know if I can assist you in the future.   Screening recommendations/referrals: Colonoscopy aged out Mammogram aged out Bone Density you have declined this at this time.  Recommended yearly ophthalmology/optometry visit for glaucoma screening and checkup Recommended yearly dental visit for hygiene and checkup  Vaccinations: Influenza vaccine DUE September 2021  Pneumococcal vaccine up to date Tdap vaccine up to date Shingles vaccine up date    Advanced directives: on file.   Conditions/risks identified: at risk for osteoporosis due to being female and age, cardiovascular risk factors.   Next appointment: c   Preventive Care 25 Years and Older, Female Preventive care refers to lifestyle choices and visits with your health care provider that can promote health and wellness. What does preventive care include?  A yearly physical exam. This is also called an annual well check.  Dental exams once or twice a year.  Routine eye exams. Ask your health care provider how often you should have your eyes checked.  Personal lifestyle choices, including:  Daily care of your teeth and gums.  Regular physical activity.  Eating a healthy diet.  Avoiding tobacco and drug use.  Limiting alcohol use.  Practicing safe sex.  Taking low-dose aspirin every day.  Taking vitamin and mineral supplements as recommended by your health care provider. What happens during an annual well check? The services and screenings done by your health care provider during your annual well check will depend on your age, overall health, lifestyle risk factors, and family history of disease. Counseling  Your health care provider may ask you questions about your:  Alcohol use.  Tobacco use.  Drug use.  Emotional  well-being.  Home and relationship well-being.  Sexual activity.  Eating habits.  History of falls.  Memory and ability to understand (cognition).  Work and work Astronomer.  Reproductive health. Screening  You may have the following tests or measurements:  Height, weight, and BMI.  Blood pressure.  Lipid and cholesterol levels. These may be checked every 5 years, or more frequently if you are over 47 years old.  Skin check.  Lung cancer screening. You may have this screening every year starting at age 79 if you have a 30-pack-year history of smoking and currently smoke or have quit within the past 15 years.  Fecal occult blood test (FOBT) of the stool. You may have this test every year starting at age 6.  Flexible sigmoidoscopy or colonoscopy. You may have a sigmoidoscopy every 5 years or a colonoscopy every 10 years starting at age 62.  Hepatitis C blood test.  Hepatitis B blood test.  Sexually transmitted disease (STD) testing.  Diabetes screening. This is done by checking your blood sugar (glucose) after you have not eaten for a while (fasting). You may have this done every 1-3 years.  Bone density scan. This is done to screen for osteoporosis. You may have this done starting at age 18.  Mammogram. This may be done every 1-2 years. Talk to your health care provider about how often you should have regular mammograms. Talk with your health care provider about your test results, treatment options, and if necessary, the need for more tests. Vaccines  Your health care provider may recommend certain vaccines, such as:  Influenza vaccine. This is recommended every year.  Tetanus, diphtheria,  and acellular pertussis (Tdap, Td) vaccine. You may need a Td booster every 10 years.  Zoster vaccine. You may need this after age 80.  Pneumococcal 13-valent conjugate (PCV13) vaccine. One dose is recommended after age 51.  Pneumococcal polysaccharide (PPSV23) vaccine. One  dose is recommended after age 72. Talk to your health care provider about which screenings and vaccines you need and how often you need them. This information is not intended to replace advice given to you by your health care provider. Make sure you discuss any questions you have with your health care provider. Document Released: 04/20/2015 Document Revised: 12/12/2015 Document Reviewed: 01/23/2015 Elsevier Interactive Patient Education  2017 [place] Prevention in the Home Falls can cause injuries. They can happen to people of all ages. There are many things you can do to make your home safe and to help prevent falls. What can I do on the outside of my home?  Regularly fix the edges of walkways and driveways and fix any cracks.  Remove anything that might make you trip as you walk through a door, such as a raised step or threshold.  Trim any bushes or trees on the path to your home.  Use bright outdoor lighting.  Clear any walking paths of anything that might make someone trip, such as rocks or tools.  Regularly check to see if handrails are loose or broken. Make sure that both sides of any steps have handrails.  Any raised decks and porches should have guardrails on the edges.  Have any leaves, snow, or ice cleared regularly.  Use sand or salt on walking paths during winter.  Clean up any spills in your garage right away. This includes oil or grease spills. What can I do in the bathroom?  Use night lights.  Install grab bars by the toilet and in the tub and shower. Do not use towel bars as grab bars.  Use non-skid mats or decals in the tub or shower.  If you need to sit down in the shower, use a plastic, non-slip stool.  Keep the floor dry. Clean up any water that spills on the floor as soon as it happens.  Remove soap buildup in the tub or shower regularly.  Attach bath mats securely with double-sided non-slip rug tape.  Do not have throw rugs and other  things on the floor that can make you trip. What can I do in the bedroom?  Use night lights.  Make sure that you have a light by your bed that is easy to reach.  Do not use any sheets or blankets that are too big for your bed. They should not hang down onto the floor.  Have a firm chair that has side arms. You can use this for support while you get dressed.  Do not have throw rugs and other things on the floor that can make you trip. What can I do in the kitchen?  Clean up any spills right away.  Avoid walking on wet floors.  Keep items that you use a lot in easy-to-reach places.  If you need to reach something above you, use a strong step stool that has a grab bar.  Keep electrical cords out of the way.  Do not use floor polish or wax that makes floors slippery. If you must use wax, use non-skid floor wax.  Do not have throw rugs and other things on the floor that can make you trip. What can I do with my  stairs?  Do not leave any items on the stairs.  Make sure that there are handrails on both sides of the stairs and use them. Fix handrails that are broken or loose. Make sure that handrails are as long as the stairways.  Check any carpeting to make sure that it is firmly attached to the stairs. Fix any carpet that is loose or worn.  Avoid having throw rugs at the top or bottom of the stairs. If you do have throw rugs, attach them to the floor with carpet tape.  Make sure that you have a light switch at the top of the stairs and the bottom of the stairs. If you do not have them, ask someone to add them for you. What else can I do to help prevent falls?  Wear shoes that:  Do not have high heels.  Have rubber bottoms.  Are comfortable and fit you well.  Are closed at the toe. Do not wear sandals.  If you use a stepladder:  Make sure that it is fully opened. Do not climb a closed stepladder.  Make sure that both sides of the stepladder are locked into place.  Ask  someone to hold it for you, if possible.  Clearly mark and make sure that you can see:  Any grab bars or handrails.  First and last steps.  Where the edge of each step is.  Use tools that help you move around (mobility aids) if they are needed. These include:  Canes.  Walkers.  Scooters.  Crutches.  Turn on the lights when you go into a dark area. Replace any light bulbs as soon as they burn out.  Set up your furniture so you have a clear path. Avoid moving your furniture around.  If any of your floors are uneven, fix them.  If there are any pets around you, be aware of where they are.  Review your medicines with your doctor. Some medicines can make you feel dizzy. This can increase your chance of falling. Ask your doctor what other things that you can do to help prevent falls. This information is not intended to replace advice given to you by your health care provider. Make sure you discuss any questions you have with your health care provider. Document Released: 01/18/2009 Document Revised: 08/30/2015 Document Reviewed: 04/28/2014 Elsevier Interactive Patient Education  2017 [employer].

## 2019-08-08 NOTE — Patient Instructions (Signed)
To start crestor 5 mg by mouth three times weekly    Heart-Healthy Eating Plan Many factors influence your heart (coronary) health, including eating and exercise habits. Coronary risk increases with abnormal blood fat (lipid) levels. Heart-healthy meal planning includes limiting unhealthy fats, increasing healthy fats, and making other diet and lifestyle changes. What are tips for following this plan? Cooking Cook foods using methods other than frying. Baking, boiling, grilling, and broiling are all good options. Other ways to reduce fat include:  Removing the skin from poultry.  Removing all visible fats from meats.  Steaming vegetables in water or broth. Meal planning   At meals, imagine dividing your plate into fourths: ? Fill one-half of your plate with vegetables and green salads. ? Fill one-fourth of your plate with whole grains. ? Fill one-fourth of your plate with lean protein foods.  Eat 4-5 servings of vegetables per day. One serving equals 1 cup raw or cooked vegetable, or 2 cups raw leafy greens.  Eat 4-5 servings of fruit per day. One serving equals 1 medium whole fruit,  cup dried fruit,  cup fresh, frozen, or canned fruit, or  cup 100% fruit juice.  Eat more foods that contain soluble fiber. Examples include apples, broccoli, carrots, beans, peas, and barley. Aim to get 25-30 g of fiber per day.  Increase your consumption of legumes, nuts, and seeds to 4-5 servings per week. One serving of dried beans or legumes equals  cup cooked, 1 serving of nuts is  cup, and 1 serving of seeds equals 1 tablespoon. Fats  Choose healthy fats more often. Choose monounsaturated and polyunsaturated fats, such as olive and canola oils, flaxseeds, walnuts, almonds, and seeds.  Eat more omega-3 fats. Choose salmon, mackerel, sardines, tuna, flaxseed oil, and ground flaxseeds. Aim to eat fish at least 2 times each week.  Check food labels carefully to identify foods with trans fats  or high amounts of saturated fat.  Limit saturated fats. These are found in animal products, such as meats, butter, and cream. Plant sources of saturated fats include palm oil, palm kernel oil, and coconut oil.  Avoid foods with partially hydrogenated oils in them. These contain trans fats. Examples are stick margarine, some tub margarines, cookies, crackers, and other baked goods.  Avoid fried foods. General information  Eat more home-cooked food and less restaurant, buffet, and fast food.  Limit or avoid alcohol.  Limit foods that are high in starch and sugar.  Lose weight if you are overweight. Losing just 5-10% of your body weight can help your overall health and prevent diseases such as diabetes and heart disease.  Monitor your salt (sodium) intake, especially if you have high blood pressure. Talk with your health care provider about your sodium intake.  Try to incorporate more vegetarian meals weekly. What foods can I eat? Fruits All fresh, canned (in natural juice), or frozen fruits. Vegetables Fresh or frozen vegetables (raw, steamed, roasted, or grilled). Green salads. Grains Most grains. Choose whole wheat and whole grains most of the time. Rice and pasta, including brown rice and pastas made with whole wheat. Meats and other proteins Lean, well-trimmed beef, veal, pork, and lamb. Chicken and [place] without skin. All fish and shellfish. Wild duck, rabbit, pheasant, and venison. Egg whites or low-cholesterol egg substitutes. Dried beans, peas, lentils, and tofu. Seeds and most nuts. Dairy Low-fat or nonfat cheeses, including ricotta and mozzarella. Skim or 1% milk (liquid, powdered, or evaporated). Buttermilk made with low-fat milk. Nonfat or low-fat  yogurt. Fats and oils Non-hydrogenated (trans-free) margarines. Vegetable oils, including soybean, sesame, sunflower, olive, peanut, safflower, corn, canola, and cottonseed. Salad dressings or mayonnaise made with a vegetable  oil. Beverages Water (mineral or sparkling). Coffee and tea. Diet carbonated beverages. Sweets and desserts Sherbet, gelatin, and fruit ice. Small amounts of dark chocolate. Limit all sweets and desserts. Seasonings and condiments All seasonings and condiments. The items listed above may not be a complete list of foods and beverages you can eat. Contact a dietitian for more options. What foods are not recommended? Fruits Canned fruit in heavy syrup. Fruit in cream or butter sauce. Fried fruit. Limit coconut. Vegetables Vegetables cooked in cheese, cream, or butter sauce. Fried vegetables. Grains Breads made with saturated or trans fats, oils, or whole milk. Croissants. Sweet rolls. Donuts. High-fat crackers, such as cheese crackers. Meats and other proteins Fatty meats, such as hot dogs, ribs, sausage, bacon, rib-eye roast or steak. High-fat deli meats, such as salami and bologna. Caviar. Domestic duck and goose. Organ meats, such as liver. Dairy Cream, sour cream, cream cheese, and creamed cottage cheese. Whole milk cheeses. Whole or 2% milk (liquid, evaporated, or condensed). Whole buttermilk. Cream sauce or high-fat cheese sauce. Whole-milk yogurt. Fats and oils Meat fat, or shortening. Cocoa butter, hydrogenated oils, palm oil, coconut oil, palm kernel oil. Solid fats and shortenings, including bacon fat, salt pork, lard, and butter. Nondairy cream substitutes. Salad dressings with cheese or sour cream. Beverages Regular sodas and any drinks with added sugar. Sweets and desserts Frosting. Pudding. Cookies. Cakes. Pies. Milk chocolate or white chocolate. Buttered syrups. Full-fat ice cream or ice cream drinks. The items listed above may not be a complete list of foods and beverages to avoid. Contact a dietitian for more information. Summary  Heart-healthy meal planning includes limiting unhealthy fats, increasing healthy fats, and making other diet and lifestyle changes.  Lose  weight if you are overweight. Losing just 5-10% of your body weight can help your overall health and prevent diseases such as diabetes and heart disease.  Focus on eating a balance of foods, including fruits and vegetables, low-fat or nonfat dairy, lean protein, nuts and legumes, whole grains, and heart-healthy oils and fats. This information is not intended to replace advice given to you by your health care provider. Make sure you discuss any questions you have with your health care provider. Document Revised: 05/01/2017 Document Reviewed: 05/01/2017 Elsevier Patient Education  2020 [employer].

## 2019-08-08 NOTE — Progress Notes (Signed)
Subjective:   April Hill is a 79 y.o. female who presents for Medicare Annual (Subsequent) preventive examination.  Review of Systems:   Cardiac Risk Factors include: obesity (BMI >30kg/m2);advanced age (>43men, >71 women);hypertension;family history of premature cardiovascular disease;dyslipidemia;sedentary lifestyle     Objective:     Vitals: BP 124/78 (BP Location: Left Arm, Patient Position: Sitting, Cuff Size: Normal)   Pulse 62   Temp (!) 97.3 F (36.3 C) (Temporal)   Ht 5\' 4"  (1.626 m)   Wt 208 lb 3.2 oz (94.4 kg)   SpO2 96%   BMI 35.74 kg/m   Body mass index is 35.74 kg/m.  Advanced Directives 08/08/2019 07/20/2019 03/18/2019 08/05/2018 01/26/2018 05/22/2017 04/16/2017  Does Patient Have a Medical Advance Directive? Yes Yes Yes Yes Yes Yes Yes  Type of 06/14/2017 of [place];Living will Healthcare Power of [place];Living will Healthcare Power of [place];Living will Healthcare Power of [place];Living will Healthcare Power of [place];Living will Healthcare Power of [place];Living will Healthcare Power of [place];Living will  Does patient want to make changes to medical advance directive? No - Patient declined - No - Patient declined No - Patient declined - No - Patient declined -  Copy of Healthcare Power of Attorney in Chart? No - copy requested No - copy requested No - copy requested No - copy requested No - copy requested No - copy requested No - copy requested    Tobacco Social History   Tobacco Use  Smoking Status Never Smoker  Smokeless Tobacco Never Used     Counseling given: Not Answered   Clinical Intake:  Pre-visit preparation completed: Yes  Pain : 0-10 Pain Score: 1  Pain Type: Chronic pain Pain Location: (hands, knees and feet) Pain Descriptors / Indicators: Aching(due to arthritis) Pain Onset: More than a month ago Pain Frequency: Intermittent Pain Relieving Factors: tylenol Effect of Pain on Daily Activities: not able  to go as long  Pain Relieving Factors: tylenol  BMI - recorded: 35.74 Nutritional Status: BMI > 30  Obese Nutritional Risks: Nausea/ vomitting/ diarrhea Diabetes: No  How often do you need to have someone help you when you read instructions, pamphlets, or other written materials from your doctor or pharmacy?: 3 - Sometimes What is the last grade level you completed in school?: 12th grade  Interpreter Needed?: No     Past Medical History:  Diagnosis Date  . Anxiety and depression   . Cataract   . 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 GERD (gastroesophageal reflux disease)   . Hyperlipemia   . Hypertension   . Osteoarthritis   . Thyroid disease    Past Surgical History:  Procedure Laterality Date  . ABDOMINAL HYSTERECTOMY  1978  . BREAST LUMPECTOMY  1980  . SALIVARY STONE REMOVAL  1980's  . TONSILLECTOMY  1950's   Family History  Problem Relation Age of Onset  . Hypertension Mother   . Heart disease Mother   . Heart attack Father 36  . Heart disease Father   . Heart attack Sister   . Arthritis Sister   . Diabetes Brother    Social History   Socioeconomic History  . Marital status: Married    Spouse name: Not on file  . Number of children: Not on file  . Years of education: Not on file  . Highest education level: Not on file  Occupational History  . Not on file  Tobacco Use  . Smoking status: Never  Smoker  . Smokeless tobacco: Never Used  Substance and Sexual Activity  . Alcohol use: No  . Drug use: No  . Sexual activity: Not Currently  Other Topics Concern  . Not on file  Social History Narrative   Social History      Diet? Off and on      Do you drink/eat things with caffeine? yes      Marital status?  married                                  What year were you married? 1960      Do you live in a house, apartment, assisted living, condo, trailer, etc.? House with daughter      Is it one or more stories? 2-we live in  basement apt      How many persons live in your home? 4      Do you have any pets in your home? (please list) cat      Highest level of education completed? High school      Current or past profession: Network engineer- cashier-hostess      Do you exercise?         no                             Type & how often? --      Advanced Directives      Do you have a living will? yes      Do you have a DNR form?       yes                            If not, do you want to discuss one?      Do you have signed POA/HPOA for forms? yes      Functional Status      Do you have difficulty bathing or dressing yourself? no      Do you have difficulty preparing food or eating? no      Do you have difficulty managing your medications? no      Do you have difficulty managing your finances? no      Do you have difficulty affording your medications? no   Social Determinants of Health   Financial Resource Strain:   . Difficulty of Paying Living Expenses:   Food Insecurity:   . Worried About Charity fundraiser in the Last Year:   . Arboriculturist in the Last Year:   Transportation Needs:   . Film/video editor (Medical):   Marland Kitchen Lack of Transportation (Non-Medical):   Physical Activity:   . Days of Exercise per Week:   . Minutes of Exercise per Session:   Stress:   . Feeling of Stress :   Social Connections:   . Frequency of Communication with Friends and Family:   . Frequency of Social Gatherings with Friends and Family:   . Attends Religious Services:   . Active Member of Clubs or Organizations:   . Attends Archivist Meetings:   Marland Kitchen Marital Status:     Outpatient Encounter Medications as of 08/08/2019  Medication Sig  . acetaminophen (TYLENOL) 500 MG tablet Take 500 mg by mouth 2 (two) times daily.  Marland Kitchen aspirin EC 81 MG tablet Take 81 mg by  mouth daily.  . Cholecalciferol (VITAMIN D3) 2000 units TABS Take 1 tablet by mouth daily.  . DULoxetine (CYMBALTA) 60 MG capsule Take 1  capsule (60 mg total) by mouth daily.  Marland Kitchen esomeprazole (NEXIUM) 40 MG capsule Take 1 capsule (40 mg total) by mouth daily at 12 noon.  Marland Kitchen glucosamine-chondroitin 500-400 MG tablet Take 1 tablet by mouth 2 (two) times daily.  [name] Oil (OMEGA-3) 500 MG CAPS Take 1 capsule by mouth 2 (two) times daily.  Marland Kitchen lisinopril-hydrochlorothiazide (ZESTORETIC) 10-12.5 MG tablet Take 1 tablet by mouth daily.  Marland Kitchen loratadine (CLARITIN) 10 MG tablet Take 10 mg by mouth daily.  . metoprolol succinate (TOPROL-XL) 50 MG 24 hr tablet Take 1 tablet (50 mg total) by mouth daily. Take with or immediately following a meal.  . Probiotic Product (PROBIOTIC DAILY PO) Take 1 tablet by mouth daily.  . Turmeric Curcumin 500 MG CAPS Take 1 capsule by mouth 2 (two) times daily.   No facility-administered encounter medications on file as of 08/08/2019.    Activities of Daily Living In your present state of health, do you have any difficulty performing the following activities: 08/08/2019  Hearing? Y  Vision? N  Difficulty concentrating or making decisions? Y  Walking or climbing stairs? Y  Comment due to knees  Dressing or bathing? N  Doing errands, shopping? N  Preparing Food and eating ? N  Using the Toilet? Y  Comment has raised toilet seat  In the past six months, have you accidently leaked urine? Y  Do you have problems with loss of bowel control? Y  Comment with stomach upset  Managing your Medications? N  Managing your Finances? N  Housekeeping or managing your Housekeeping? N  Some recent data might be hidden    Patient Care Team: [name], NP as PCP - General (Geriatric Medicine)    Assessment:   This is a routine wellness examination for Chrys.  Exercise Activities and Dietary recommendations Current Exercise Habits: The patient does not participate in regular exercise at present  Goals    . Patient Stated     Stay healthy        Fall Risk Fall Risk  08/08/2019 08/08/2019 07/20/2019  03/18/2019 11/04/2018  Falls in the past year? 1 0 0 1 1  Number falls in past yr: 0 0 0 0 0  Injury with Fall? 0 0 0 0 0   Is the patient's home free of loose throw rugs in walkways, pet beds, electrical cords, etc?   no      Grab bars in the bathroom? yes      Handrails on the stairs?   yes      Adequate lighting?   yes  Timed Get Up and Go performed: na  Depression Screen PHQ 2/9 Scores 08/08/2019 08/08/2019 03/18/2019 08/05/2018  PHQ - 2 Score 0 0 0 0  PHQ- 9 Score - - - -     Cognitive Function MMSE - Mini Mental State Exam 08/08/2019 05/22/2017  Orientation to time 5 5  Orientation to Place 5 5  Registration 3 3  Attention/ Calculation 5 5  Recall 2 3  Language- name 2 objects 2 2  Language- repeat 1 1  Language- follow 3 step command 3 3  Language- read & follow direction 1 1  Write a sentence 1 1  Copy design 1 0  Total score 29 29     6CIT Screen 08/05/2018  What Year? 0 points  What month? 0 points  What time? 0 points  Count back from 20 0 points  Months in reverse 0 points  Repeat phrase 0 points  Total Score 0    Immunization History  Administered Date(s) Administered  . Influenza, High Dose Seasonal PF 01/20/2017, 12/08/2018  . Influenza, Seasonal, Injecte, Preservative Fre 04/07/2010  . Influenza-Unspecified 02/06/2016, 01/01/2018  . Pneumococcal Conjugate-13 05/08/2014  . Pneumococcal Polysaccharide-23 04/07/2009  . Tdap 04/07/2012  . Zoster 05/08/2014  . Zoster Recombinat (Shingrix) 12/08/2018, 02/07/2019    Qualifies for Shingles Vaccine?up to date  Screening Tests Health Maintenance  Topic Date Due  . COVID-19 Vaccine (1) Never done  . DEXA SCAN  Never done  . INFLUENZA VACCINE  11/06/2019  . TETANUS/TDAP  04/07/2022  . PNA vac Low Risk Adult  Completed    Cancer Screenings: Lung: Low Dose CT Chest recommended if Age 31-80 years, 30 pack-year currently smoking OR have quit w/in 15years. Patient does not qualify. Breast:  Up to date on  Mammogram? Yes  Aged out Up to date of Bone Density/Dexa? No recommended Colorectal: aged out.  Additional Screenings: na Hepatitis C Screening:      Plan:      I have personally reviewed and noted the following in the patient's chart:   . Medical and social history . Use of alcohol, tobacco or illicit drugs  . Current medications and supplements . Functional ability and status . Nutritional status . Physical activity . Advanced directives . List of other physicians . Hospitalizations, surgeries, and ER visits in previous 12 months . Vitals . Screenings to include cognitive, depression, and falls . Referrals and appointments  In addition, I have reviewed and discussed with patient certain preventive protocols, quality metrics, and best practice recommendations. A written personalized care plan for preventive services as well as general preventive health recommendations were provided to patient.     [name], NP  08/08/2019

## 2019-08-08 NOTE — Progress Notes (Signed)
Careteam: Patient Care Team: Lauree Chandler, NP as PCP - General (Geriatric Medicine)  PLACE OF SERVICE:  [facility]  Advanced Directive information    Allergies  Allergen Reactions  . Amoxicillin Diarrhea    Chief Complaint  Patient presents with  . Advanced Directive    Discuss MOST form. Here with daughter Olivia Mackie   . Acute Visit    Patient c/o myalgias   . Medication Management    Off Lipitor, patient states " I feel better"   . Allergies    Ongoing drainage and itchy eyes, taking Claritin.      HPI: Patient is a 79 y.o. female for follow up.  She was in office last month and complaints of myaglias and fatigue. Overall feeling better with being off lipitor.  Not good at watching her diet.   Seasonal allergies- a lot of pollen and trees at her house. Has a cat.  Taking Claritin 10 mg daily which has helped but wants something more.  Always having drainage and tingle in throat.  No wheezing.   OA- ongoing knee pain, but wants to wait until closer to time to go to beach before she gets this done. Supplements have helped the pain in her joints.   Mood has improved with the weather getting better.  Review of Systems:  Review of Systems  Constitutional: Negative for chills, fever and weight loss.  HENT: Negative for tinnitus.   Respiratory: Negative for cough, sputum production and shortness of breath.   Cardiovascular: Negative for chest pain, palpitations and leg swelling.  Gastrointestinal: Negative for abdominal pain, constipation, diarrhea and heartburn.  Genitourinary: Negative for dysuria, frequency and urgency.  Musculoskeletal: Positive for joint pain. Negative for back pain, falls and myalgias (improved off statin).  Skin: Negative.   Neurological: Negative for dizziness and headaches.  Psychiatric/Behavioral: Negative for depression and memory loss. The patient does not have insomnia.        Occasional sadness, wanting things to be how they use to  be before her husband died.     Past Medical History:  Diagnosis Date  . Anxiety and depression   . Cataract   . 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 GERD (gastroesophageal reflux disease)   . Hyperlipemia   . Hypertension   . Osteoarthritis   . Thyroid disease    Past Surgical History:  Procedure Laterality Date  . ABDOMINAL HYSTERECTOMY  1978  . BREAST LUMPECTOMY  1980  . SALIVARY STONE REMOVAL  1980's  . TONSILLECTOMY  1950's   Social History:   reports that she has never smoked. She has never used smokeless tobacco. She reports that she does not drink alcohol or use drugs.  Family History  Problem Relation Age of Onset  . Hypertension Mother   . Heart disease Mother   . Heart attack Father 42  . Heart disease Father   . Heart attack Sister   . Arthritis Sister   . Diabetes Brother     Medications: Patient's Medications  New Prescriptions   No medications on file  Previous Medications   ACETAMINOPHEN (TYLENOL) 500 MG TABLET    Take 500 mg by mouth 2 (two) times daily.   ASPIRIN EC 81 MG TABLET    Take 81 mg by mouth daily.   CHOLECALCIFEROL (VITAMIN D3) 2000 UNITS TABS    Take 1 tablet by mouth daily.   DULOXETINE (CYMBALTA) 60 MG CAPSULE  Take 1 capsule (60 mg total) by mouth daily.   ESOMEPRAZOLE (NEXIUM) 40 MG CAPSULE    Take 1 capsule (40 mg total) by mouth daily at 12 noon.   GLUCOSAMINE-CHONDROITIN 500-400 MG TABLET    Take 1 tablet by mouth 2 (two) times daily.   KRILL OIL (OMEGA-3) 500 MG CAPS    Take 1 capsule by mouth 2 (two) times daily.   LISINOPRIL-HYDROCHLOROTHIAZIDE (ZESTORETIC) 10-12.5 MG TABLET    Take 1 tablet by mouth daily.   LORATADINE (CLARITIN) 10 MG TABLET    Take 10 mg by mouth daily.   METOPROLOL SUCCINATE (TOPROL-XL) 50 MG 24 HR TABLET    Take 1 tablet (50 mg total) by mouth daily. Take with or immediately following a meal.   PROBIOTIC PRODUCT (PROBIOTIC DAILY PO)    Take 1 tablet by mouth daily.   TURMERIC  CURCUMIN 500 MG CAPS    Take 1 capsule by mouth 2 (two) times daily.  Modified Medications   No medications on file  Discontinued Medications   ATORVASTATIN (LIPITOR) 10 MG TABLET    Take 1 tablet (10 mg total) by mouth at bedtime.    Physical Exam:  Vitals:   08/08/19 1330  BP: 128/72  Pulse: 62  Temp: (!) 97.3 F (36.3 C)  TempSrc: Temporal  SpO2: 96%  Weight: 208 lb (94.3 kg)  Height: 5\' 4"  (1.626 m)   Body mass index is 35.7 kg/m. Wt Readings from Last 3 Encounters:  08/08/19 208 lb (94.3 kg)  07/20/19 205 lb 12.8 oz (93.4 kg)  03/18/19 209 lb (94.8 kg)    Physical Exam Constitutional:      General: She is not in acute distress.    Appearance: She is well-developed. She is not diaphoretic.  HENT:     Head: Normocephalic and atraumatic.     Right Ear: External ear normal. There is no impacted cerumen.     Left Ear: External ear normal. There is no impacted cerumen.  Eyes:     Conjunctiva/sclera: Conjunctivae normal.     Pupils: Pupils are equal, round, and reactive to light.  Cardiovascular:     Rate and Rhythm: Normal rate and regular rhythm.     Heart sounds: Normal heart sounds.  Pulmonary:     Effort: Pulmonary effort is normal.     Breath sounds: Normal breath sounds.  Musculoskeletal:        General: No tenderness.     Cervical back: Normal range of motion and neck supple.  Skin:    General: Skin is warm and dry.  Neurological:     Mental Status: She is alert and oriented to person, place, and time. Mental status is at baseline.  Psychiatric:        Mood and Affect: Mood normal.        Behavior: Behavior normal.     Labs reviewed: Basic Metabolic Panel: Recent Labs    11/01/18 0907 03/14/19 0931 07/20/19 1353  NA 142 141 139  K 4.9 4.6 4.3  CL 106 106 103  CO2 27 25 26   GLUCOSE 117* 103* 107  BUN 17 19 17   CREATININE 0.73 0.67 0.75  CALCIUM 9.7 9.6 9.8  TSH  --   --  0.90   Liver Function Tests: Recent Labs    11/01/18 0907  07/20/19 1353  AST 16 19  ALT 13 14  BILITOT 0.6 0.7  PROT 6.3 6.5   No results for input(s): LIPASE, AMYLASE in the last  8760 hours. No results for input(s): AMMONIA in the last 8760 hours. CBC: Recent Labs    07/14/19 0000  WBC 10.3  HGB 15.2  HCT 48*  PLT 304   Lipid Panel: Recent Labs    11/01/18 0907 03/14/19 0931  CHOL 158 144  HDL 49* 52  LDLCALC 79 66  TRIG 201* 185*  CHOLHDL 3.2 2.8   TSH: Recent Labs    07/20/19 1353  TSH 0.90   A1C: Lab Results  Component Value Date   HGBA1C 6.1 (H) 11/01/2018     Assessment/Plan 1. Mixed hyperlipidemia -myalgias much better off lipitor. Will start low dose crestor to see if she has less muscle/joint pains  - rosuvastatin (CRESTOR) 5 MG tablet; 1 tablet by mouth three times weekly  Dispense: 30 tablet; Refill: 1 -follow up cmp and lipid in 6 weeks.  2. Fatigue, unspecified type Has improved at this time. Doing better with more sunshine and warmer weather.  3. Primary osteoarthritis involving multiple joints -ongoing but stable. Uses tylenol which is effective and planning on getting knee injections prior to beach trip.  4. Seasonal allergies -improved on claritin but ongoing, can add flonase daily to each nare at this time as needed for allergies.  Next appt: 6 weeks for follow up lipids, labs prior.  Janene Harvey. Biagio Borg  [facility] [phone]

## 2019-09-06 ENCOUNTER — Ambulatory Visit (INDEPENDENT_AMBULATORY_CARE_PROVIDER_SITE_OTHER): Payer: Medicare HMO | Admitting: Family Medicine

## 2019-09-06 ENCOUNTER — Encounter: Payer: Self-pay | Admitting: Family Medicine

## 2019-09-06 ENCOUNTER — Other Ambulatory Visit: Payer: Self-pay

## 2019-09-06 DIAGNOSIS — M1712 Unilateral primary osteoarthritis, left knee: Secondary | ICD-10-CM

## 2019-09-06 DIAGNOSIS — M170 Bilateral primary osteoarthritis of knee: Secondary | ICD-10-CM

## 2019-09-06 DIAGNOSIS — M1711 Unilateral primary osteoarthritis, right knee: Secondary | ICD-10-CM | POA: Diagnosis not present

## 2019-09-06 NOTE — Progress Notes (Signed)
   Office Visit Note   Patient: April Hill           Date of Birth: 01/29/1941           MRN: [id number] Visit Date: 09/06/2019 Requested by: [name], NP [address] [place]. [place],  [place] [address] PCP: [name], NP  Subjective: Chief Complaint  Patient presents with  . Right Knee - Pain    Chronic pain bilateral knees. Cortisone injections last July lasted about 3 months. Going to the beach in 2 weeks.   . Left Knee - Pain    HPI: Here with bilateral knee pain.  Going to [place] soon for her birthday.  Injections help about 3 months.              ROS:   All other systems were reviewed and are negative.  Objective: Vital Signs: There were no vitals taken for this visit.  Physical Exam:  General:  Alert and oriented, in no acute distress. Pulm:  Breathing unlabored. Psy:  Normal mood, congruent affect. Skin:  No rash  Knees:  Trace bilateral effusions.  Tender medial and lateral joint lines.  Imaging: No results found.  Assessment & Plan: 1.  Bilateral knee DJD - Each knee injected today.  Gel injections in future if no long-term relief.     Procedures: Bilateral knee steroid injection:  After sterile prep, injected 3 cc 1% lidocaine without epi and 40 mg methylprednisolone from superolateral approach right knee and superomedial approach left knee.  Flash of synovial fluid obtained prior to each injection.   PMFS History: Patient Active Problem List   Diagnosis Date Noted  . Elevated alkaline phosphatase level 09/22/2017  . Hypertension   . Hyperlipemia   . Anxiety and depression   . Osteoarthritis   . Irritable bowel syndrome with diarrhea 11/05/2016  . History of colon polyps 11/05/2016  . Abnormal nuclear stress test 08/05/2013  . Vitamin D deficiency 03/11/2011  . Obesity 03/11/2011  . Gastro-esophageal reflux disease without esophagitis 03/11/2011  . Abnormal glucose 03/11/2011   Past Medical History:  Diagnosis Date  .  Anxiety and depression   . Cataract   . 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 GERD (gastroesophageal reflux disease)   . Hyperlipemia   . Hypertension   . Osteoarthritis   . Thyroid disease     Family History  Problem Relation Age of Onset  . Hypertension Mother   . Heart disease Mother   . Heart attack Father 59  . Heart disease Father   . Heart attack Sister   . Arthritis Sister   . Diabetes Brother     Past Surgical History:  Procedure Laterality Date  . ABDOMINAL HYSTERECTOMY  1978  . BREAST LUMPECTOMY  1980  . SALIVARY STONE REMOVAL  1980's  . TONSILLECTOMY  1950's   Social History   Occupational History  . Not on file  Tobacco Use  . Smoking status: Never Smoker  . Smokeless tobacco: Never Used  Substance and Sexual Activity  . Alcohol use: No  . Drug use: No  . Sexual activity: Not Currently

## 2019-09-26 ENCOUNTER — Other Ambulatory Visit: Payer: Medicare HMO

## 2019-09-26 ENCOUNTER — Other Ambulatory Visit: Payer: Self-pay

## 2019-09-26 DIAGNOSIS — E782 Mixed hyperlipidemia: Secondary | ICD-10-CM | POA: Diagnosis not present

## 2019-09-27 LAB — LIPID PANEL
Cholesterol: 157 mg/dL (ref <200)
HDL: 62 mg/dL (ref >=50)
LDL Cholesterol (Calc): 73 mg/dL (calc)
Non-HDL Cholesterol (Calc): 95 mg/dL (calc) (ref <130)
Total CHOL/HDL Ratio: 2.5 (calc) (ref <5.0)
Triglycerides: 140 mg/dL (ref <150)

## 2019-09-27 LAB — COMPLETE METABOLIC PANEL WITH GFR
AG Ratio: 1.7 (calc) (ref 1.0–2.5)
ALT: 13 U/L (ref 6–29)
AST: 14 U/L (ref 10–35)
Albumin: 4 g/dL (ref 3.6–5.1)
Alkaline phosphatase (APISO): 160 U/L — ABNORMAL HIGH (ref 37–153)
BUN: 19 mg/dL (ref 7–25)
CO2: 25 mmol/L (ref 20–32)
Calcium: 9.6 mg/dL (ref 8.6–10.4)
Chloride: 104 mmol/L (ref 98–110)
Creat: 0.72 mg/dL (ref 0.60–0.88)
GFR, Est African American: 92 mL/min/{1.73_m2} (ref >=60)
GFR, Est Non African American: 79 mL/min/{1.73_m2} (ref >=60)
Globulin: 2.3 g/dL (calc) (ref 1.9–3.7)
Glucose, Bld: 104 mg/dL — ABNORMAL HIGH (ref 65–99)
Potassium: 4.3 mmol/L (ref 3.5–5.3)
Sodium: 140 mmol/L (ref 135–146)
Total Bilirubin: 0.6 mg/dL (ref 0.2–1.2)
Total Protein: 6.3 g/dL (ref 6.1–8.1)

## 2019-09-28 ENCOUNTER — Telehealth (INDEPENDENT_AMBULATORY_CARE_PROVIDER_SITE_OTHER): Payer: Medicare HMO | Admitting: Nurse Practitioner

## 2019-09-28 ENCOUNTER — Other Ambulatory Visit: Payer: Self-pay

## 2019-09-28 ENCOUNTER — Encounter: Payer: Self-pay | Admitting: Nurse Practitioner

## 2019-09-28 ENCOUNTER — Telehealth: Payer: Self-pay

## 2019-09-28 DIAGNOSIS — E782 Mixed hyperlipidemia: Secondary | ICD-10-CM | POA: Diagnosis not present

## 2019-09-28 DIAGNOSIS — F329 Major depressive disorder, single episode, unspecified: Secondary | ICD-10-CM

## 2019-09-28 DIAGNOSIS — I10 Essential (primary) hypertension: Secondary | ICD-10-CM | POA: Diagnosis not present

## 2019-09-28 DIAGNOSIS — F419 Anxiety disorder, unspecified: Secondary | ICD-10-CM

## 2019-09-28 DIAGNOSIS — F32A Depression, unspecified: Secondary | ICD-10-CM

## 2019-09-28 MED ORDER — ROSUVASTATIN CALCIUM 5 MG PO TABS: ORAL_TABLET | ORAL | 1 refills | Status: DC

## 2019-09-28 MED ORDER — DULOXETINE HCL 60 MG PO CPEP: 60.0000 mg | ORAL_CAPSULE | Freq: Every day | ORAL | 1 refills | Status: DC

## 2019-09-28 MED ORDER — LISINOPRIL-HYDROCHLOROTHIAZIDE 10-12.5 MG PO TABS: 1.0000 | ORAL_TABLET | Freq: Every day | ORAL | 1 refills | Status: DC

## 2019-09-28 MED ORDER — METOPROLOL SUCCINATE ER 50 MG PO TB24: 50.0000 mg | ORAL_TABLET | Freq: Every day | ORAL | 1 refills | Status: DC

## 2019-09-28 NOTE — Telephone Encounter (Signed)
[name], quesnel are scheduled for a virtual visit with your provider today.    Just as we do with appointments in the office, we must obtain your consent to participate.  Your consent will be active for this visit and any virtual visit you may have with one of our providers in the next 365 days.    If you have a MyChart account, I can also send a copy of this consent to you electronically.  All virtual visits are billed to your insurance company just like a traditional visit in the office.  As this is a virtual visit, video technology does not allow for your provider to perform a traditional examination.  This may limit your provider's ability to fully assess your condition.  If your provider identifies any concerns that need to be evaluated in person or the need to arrange testing such as labs, EKG, etc, we will make arrangements to do so.    Although advances in technology are sophisticated, we cannot ensure that it will always work on either your end or our end.  If the connection with a video visit is poor, we may have to switch to a telephone visit.  With either a video or telephone visit, we are not always able to ensure that we have a secure connection.   I need to obtain your verbal consent now.   Are you willing to proceed with your visit today?   [name] has provided verbal consent on 09/28/2019 for a virtual visit (video or telephone). yes  [name], [facility] 09/28/2019  8:26 AM

## 2019-09-28 NOTE — Progress Notes (Signed)
This service is provided via telemedicine  No vital signs collected/recorded due to the encounter was a telemedicine visit.   Location of patient (ex: home, work): Home   Patient consents to a telephone visit: yes  Location of the provider (ex: office, home): [facility] Name of any referring provider:Charlane Westry NP Names of all persons participating in the telemedicine service and their role in the encounter:April Hill, Patient, April Hill CM A  Time spent on call:      Careteam: Patient Care Team: April Seller, NP as PCP - General (Geriatric Medicine)  PLACE OF SERVICE:  [facility]  Advanced Directive information Does Patient Have a Medical Advance Directive?: Yes, Type of Advance Directive: Healthcare Power of [place];Out of facility DNR (pink MOST or yellow form), Does patient want to make changes to medical advance directive?: No - Patient declined  Allergies  Allergen Reactions  . Amoxicillin Diarrhea    Chief Complaint  Patient presents with  . Medical Management of Chronic Issues    Cholesterol follow up      HPI: Patient is a 79 y.o. female for cholesterol follow up.  Lipitor was changed to crestor due to myalgias. Myalgias are better since off lipitor. Occasional cramps but nothing out of the norm.  LDL stable at 73.    Went to [employer] for a week, lots of walking and was ready to be home.   Review of Systems:  Review of Systems  Constitutional: Negative for chills, fever and malaise/fatigue.  Musculoskeletal: Positive for joint pain. Negative for myalgias.    Past Medical History:  Diagnosis Date  . Anxiety and depression   . Cataract   . 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 GERD (gastroesophageal reflux disease)   . Hyperlipemia   . Hypertension   . Osteoarthritis   . Thyroid disease    Past Surgical History:  Procedure Laterality Date  . ABDOMINAL HYSTERECTOMY  1978  . BREAST LUMPECTOMY  1980  .  SALIVARY STONE REMOVAL  1980's  . TONSILLECTOMY  1950's   Social History:   reports that she has never smoked. She has never used smokeless tobacco. She reports that she does not drink alcohol and does not use drugs.  Family History  Problem Relation Age of Onset  . Hypertension Mother   . Heart disease Mother   . Heart attack Father 58  . Heart disease Father   . Heart attack Sister   . Arthritis Sister   . Diabetes Brother     Medications: Patient's Medications  New Prescriptions   No medications on file  Previous Medications   ACETAMINOPHEN (TYLENOL) 500 MG TABLET    Take 500 mg by mouth 2 (two) times daily.   ASPIRIN EC 81 MG TABLET    Take 81 mg by mouth daily.   CHOLECALCIFEROL (VITAMIN D3) 2000 UNITS TABS    Take 1 tablet by mouth daily.   DULOXETINE (CYMBALTA) 60 MG CAPSULE    Take 1 capsule (60 mg total) by mouth daily.   ESOMEPRAZOLE (NEXIUM) 40 MG CAPSULE    Take 1 capsule (40 mg total) by mouth daily at 12 noon.   GLUCOSAMINE-CHONDROITIN 500-400 MG TABLET    Take 1 tablet by mouth 2 (two) times daily.   KRILL OIL (OMEGA-3) 500 MG CAPS    Take 1 capsule by mouth 2 (two) times daily.   LISINOPRIL-HYDROCHLOROTHIAZIDE (ZESTORETIC) 10-12.5 MG TABLET    Take 1 tablet  by mouth daily.   LORATADINE (CLARITIN) 10 MG TABLET    Take 10 mg by mouth daily.   METOPROLOL SUCCINATE (TOPROL-XL) 50 MG 24 HR TABLET    Take 1 tablet (50 mg total) by mouth daily. Take with or immediately following a meal.   PROBIOTIC PRODUCT (PROBIOTIC DAILY PO)    Take 1 tablet by mouth daily.   ROSUVASTATIN (CRESTOR) 5 MG TABLET    1 tablet by mouth three times weekly   TURMERIC CURCUMIN 500 MG CAPS    Take 1 capsule by mouth 2 (two) times daily.  Modified Medications   No medications on file  Discontinued Medications   No medications on file    Physical Exam:  There were no vitals filed for this visit. There is no height or weight on file to calculate BMI. Wt Readings from Last 3 Encounters:   08/08/19 208 lb 3.2 oz (94.4 kg)  08/08/19 208 lb (94.3 kg)  07/20/19 205 lb 12.8 oz (93.4 kg)      Labs reviewed: Basic Metabolic Panel: Recent Labs    03/14/19 0931 07/20/19 1353 09/26/19 0920  NA 141 139 140  K 4.6 4.3 4.3  CL 106 103 104  CO2 25 26 25   GLUCOSE 103* 107 104*  BUN 19 17 19   CREATININE 0.67 0.75 0.72  CALCIUM 9.6 9.8 9.6  TSH  --  0.90  --    Liver Function Tests: Recent Labs    11/01/18 0907 07/20/19 1353 09/26/19 0920  AST 16 19 14   ALT 13 14 13   BILITOT 0.6 0.7 0.6  PROT 6.3 6.5 6.3   No results for input(s): LIPASE, AMYLASE in the last 8760 hours. No results for input(s): AMMONIA in the last 8760 hours. CBC: Recent Labs    07/14/19 0000  WBC 10.3  HGB 15.2  HCT 48*  PLT 304   Lipid Panel: Recent Labs    11/01/18 0907 03/14/19 0931 09/26/19 0920  CHOL 158 144 157  HDL 49* 52 62  LDLCALC 79 66 73  TRIG 201* 185* 140  CHOLHDL 3.2 2.8 2.5   TSH: Recent Labs    07/20/19 1353  TSH 0.90   A1C: Lab Results  Component Value Date   HGBA1C 6.1 (H) 11/01/2018     Assessment/Plan 1. Mixed hyperlipidemia -doing well on crestor three times weekly, continue dietary modification and crestor.  - rosuvastatin (CRESTOR) 5 MG tablet; 1 tablet by mouth three times weekly  Dispense: 40 tablet; Refill: 1  2. Essential hypertension - lisinopril-hydrochlorothiazide (ZESTORETIC) 10-12.5 MG tablet; Take 1 tablet by mouth daily.  Dispense: 90 tablet; Refill: 1 - metoprolol succinate (TOPROL-XL) 50 MG 24 hr tablet; Take 1 tablet (50 mg total) by mouth daily. Take with or immediately following a meal.  Dispense: 90 tablet; Refill: 1  3. Anxiety and depression - DULoxetine (CYMBALTA) 60 MG capsule; Take 1 capsule (60 mg total) by mouth daily.  Dispense: 90 capsule; Refill: 1  Next appt: 4 months with lab prior  April Hill K. 14/07/20  [facility] [phone]   Virtual Visit via Video Note  I connected with  11/03/2018 on 09/28/19 at  9:30 AM EDT by a video enabled telemedicine application and verified that I am speaking with the correct person using two identifiers.  Location: Patient: home Provider: office   I discussed the limitations of evaluation and management by telemedicine and the availability of in person appointments. The patient expressed understanding and agreed  to proceed.    I discussed the assessment and treatment plan with the patient. The patient was provided an opportunity to ask questions and all were answered. The patient agreed with the plan and demonstrated an understanding of the instructions.   The patient was advised to call back or seek an in-person evaluation if the symptoms worsen or if the condition fails to improve as anticipated.  I provided 15 minutes of non-face-to-face time during this encounter.  Carlos American. Dewaine Oats, AGNP Avs printed and mailed.

## 2019-12-26 ENCOUNTER — Other Ambulatory Visit: Payer: Self-pay

## 2019-12-26 ENCOUNTER — Ambulatory Visit (INDEPENDENT_AMBULATORY_CARE_PROVIDER_SITE_OTHER): Payer: Medicare HMO | Admitting: Adult Health

## 2019-12-26 ENCOUNTER — Encounter: Payer: Self-pay | Admitting: Adult Health

## 2019-12-26 VITALS — BP 134/76 | HR 96 | Temp 96.9°F | Ht 64.0 in | Wt 208.2 lb

## 2019-12-26 DIAGNOSIS — K580 Irritable bowel syndrome with diarrhea: Secondary | ICD-10-CM | POA: Diagnosis not present

## 2019-12-26 DIAGNOSIS — Z1152 Encounter for screening for COVID-19: Secondary | ICD-10-CM

## 2019-12-26 DIAGNOSIS — U071 COVID-19: Secondary | ICD-10-CM

## 2019-12-26 MED ORDER — ZINC SULFATE 220 (50 ZN) MG PO CAPS: 220.0000 mg | ORAL_CAPSULE | Freq: Every day | ORAL | 0 refills | Status: AC

## 2019-12-26 MED ORDER — DOXYCYCLINE HYCLATE 100 MG PO TABS: 100.0000 mg | ORAL_TABLET | Freq: Two times a day (BID) | ORAL | 0 refills | Status: AC

## 2019-12-26 MED ORDER — LOPERAMIDE HCL 2 MG PO TABS: 2.0000 mg | ORAL_TABLET | Freq: Three times a day (TID) | ORAL | 0 refills | Status: AC | PRN

## 2019-12-26 MED ORDER — DEXAMETHASONE 4 MG PO TABS: 4.0000 mg | ORAL_TABLET | Freq: Every day | ORAL | 0 refills | Status: AC

## 2019-12-26 NOTE — Progress Notes (Signed)
[facility] [place] [facility]  Provider: Kenard Gower - DNP  Code Status: DNR  Goals of Care:  Advanced Directives 12/26/2019  Does Patient Have a Medical Advance Directive? Yes  Type of Advance Directive Out of facility DNR (pink MOST or yellow form)  Does patient want to make changes to medical advance directive? No - Patient declined  Copy of Healthcare Power of Attorney in Chart? -  Pre-existing out of facility DNR order (yellow form or pink MOST form) Yellow form placed in chart (order not valid for inpatient use);Pink MOST form placed in chart (order not valid for inpatient use)     Chief Complaint  Patient presents with  . Acute Visit    Patient complains of diarrhea, SOB with exertion, congestion. Son-in-law went to the ER today and tested positive for COVID.   Marland Kitchen Immunizations    Has not been vaccinated for COVID. Not sure if she will get it when she recovers.     HPI: Patient is a 79 y.o. female seen today for an acute visit for complaints of diarrhea, SOB with exertion. She started having clear nasal drainage and moderate respiratory mucus 5 days ago. She said that yesterday, she started having mucoid diarrhea, "stomach feels horrible." She denies having cough, chills nor fever but feels short of breath upon exertion. O2 sat today was 94%. She has not had COVID-19 vaccination yet. She stated that her son-in-law tested positive for COVID-19 and has been having O2 sat of 40%. She has PMH of IBS, GERD and hypertension.    Past Medical History:  Diagnosis Date  . Anxiety and depression   . Cataract   . 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 GERD (gastroesophageal reflux disease)   . Hyperlipemia   . Hypertension   . Osteoarthritis   . Thyroid disease     Past Surgical History:  Procedure Laterality Date  . ABDOMINAL HYSTERECTOMY  1978  . BREAST LUMPECTOMY  1980  . SALIVARY STONE REMOVAL  1980's  . TONSILLECTOMY  1950's    Allergies  Allergen  Reactions  . Amoxicillin Diarrhea    Outpatient Encounter Medications as of 12/26/2019  Medication Sig  . acetaminophen (TYLENOL) 500 MG tablet Take 500 mg by mouth 2 (two) times daily.  Marland Kitchen aspirin EC 81 MG tablet Take 81 mg by mouth daily.  . Cholecalciferol (VITAMIN D3) 2000 units TABS Take 1 tablet by mouth daily.  . DULoxetine (CYMBALTA) 60 MG capsule Take 1 capsule (60 mg total) by mouth daily.  Marland Kitchen esomeprazole (NEXIUM) 40 MG capsule Take 1 capsule (40 mg total) by mouth daily at 12 noon.  Marland Kitchen glucosamine-chondroitin 500-400 MG tablet Take 1 tablet by mouth 2 (two) times daily.  [name] Oil (OMEGA-3) 500 MG CAPS Take 1 capsule by mouth 2 (two) times daily.  Marland Kitchen lisinopril-hydrochlorothiazide (ZESTORETIC) 10-12.5 MG tablet Take 1 tablet by mouth daily.  . metoprolol succinate (TOPROL-XL) 50 MG 24 hr tablet Take 1 tablet (50 mg total) by mouth daily. Take with or immediately following a meal.  . Probiotic Product (PROBIOTIC DAILY PO) Take 1 tablet by mouth daily.  . rosuvastatin (CRESTOR) 5 MG tablet 1 tablet by mouth three times weekly  . Turmeric Curcumin 500 MG CAPS Take 1 capsule by mouth 2 (two) times daily.  . [DISCONTINUED] loratadine (CLARITIN) 10 MG tablet Take 10 mg by mouth daily.   No facility-administered encounter medications on file as of 12/26/2019.    Review of  Systems:  Review of Systems  Constitutional: Positive for appetite change. Negative for chills.  HENT: Negative for congestion and sneezing.        Has nasal drainage and respiratory mucus 5 days ago  Eyes: Negative.   Respiratory: Positive for shortness of breath. Negative for cough and wheezing.        SOB with exertion  Cardiovascular: Negative for chest pain and leg swelling.  Gastrointestinal: Positive for abdominal pain and diarrhea. Negative for nausea and vomiting.       Mucoid diarrhea  Endocrine: Negative.   Genitourinary: Negative.   Skin: Negative.  Negative for rash.  Neurological: Positive for  dizziness. Negative for headaches.  Psychiatric/Behavioral: Negative.  Negative for agitation and confusion.    Health Maintenance  Topic Date Due  . COVID-19 Vaccine (1) Never done  . DEXA SCAN  Never done  . INFLUENZA VACCINE  11/06/2019  . TETANUS/TDAP  04/07/2022  . PNA vac Low Risk Adult  Completed    Physical Exam: Vitals:   12/26/19 1317  BP: 134/76  Pulse: 96  Temp: (!) 96.9 F (36.1 C)  TempSrc: Temporal  SpO2: 94%  Weight: 208 lb 3.2 oz (94.4 kg)  Height: 5\' 4"  (1.626 m)   Body mass index is 35.74 kg/m. Physical Exam Constitutional:      Appearance: She is obese.  HENT:     Head: Normocephalic and atraumatic.  Cardiovascular:     Rate and Rhythm: Normal rate and regular rhythm.     Pulses: Normal pulses.     Heart sounds: Normal heart sounds.  Pulmonary:     Effort: Pulmonary effort is normal.     Breath sounds: Normal breath sounds.  Abdominal:     General: Bowel sounds are normal.     Palpations: Abdomen is soft.  Musculoskeletal:        General: No swelling or tenderness. Normal range of motion.     Cervical back: Normal range of motion.  Skin:    General: Skin is warm and dry.  Neurological:     General: No focal deficit present.     Mental Status: She is alert and oriented to person, place, and time.  Psychiatric:        Mood and Affect: Mood normal.        Behavior: Behavior normal.        Thought Content: Thought content normal.        Judgment: Judgment normal.     Labs reviewed: Basic Metabolic Panel: Recent Labs    03/14/19 0931 07/20/19 1353 09/26/19 0920  NA 141 139 140  K 4.6 4.3 4.3  CL 106 103 104  CO2 25 26 25   GLUCOSE 103* 107 104*  BUN 19 17 19   CREATININE 0.67 0.75 0.72  CALCIUM 9.6 9.8 9.6  TSH  --  0.90  --    Liver Function Tests: Recent Labs    07/20/19 1353 09/26/19 0920  AST 19 14  ALT 14 13  BILITOT 0.7 0.6  PROT 6.5 6.3   CBC: Recent Labs    07/14/19 0000  WBC 10.3  HGB 15.2  HCT 48*  PLT  304   Lipid Panel: Recent Labs    03/14/19 0931 09/26/19 0920  CHOL 144 157  HDL 52 62  LDLCALC 66 73  TRIG 185* 140  CHOLHDL 2.8 2.5   Lab Results  Component Value Date   HGBA1C 6.1 (H) 11/01/2018     Assessment/Plan  1. Encounter  for screening for COVID-19 - SARS-COV-2 RNA,(COVID-19) QUAL NAAT  2. COVID-19 -  Will start treatment for ambulatory patients - doxycycline (VIBRA-TABS) 100 MG tablet; Take 1 tablet (100 mg total) by mouth 2 (two) times daily for 10 days.  Dispense: 20 tablet; Refill: 0 - zinc sulfate 220 (50 Zn) MG capsule; Take 1 capsule (220 mg total) by mouth daily.  Dispense: 30 capsule; Refill: 0 - dexamethasone (DECADRON) 4 MG tablet; Take 1 tablet (4 mg total) by mouth daily for 10 days.  Dispense: 10 tablet; Refill: 0 -  Continue ASA EC 81 mg daily  3. Irritable bowel syndrome with diarrhea - loperamide (IMODIUM A-D) 2 MG tablet; Take 1 tablet (2 mg total) by mouth 3 (three) times daily as needed for diarrhea or loose stools (for diarrhea).  Dispense: 30 tablet; Refill: 0   Labs/tests ordered:  SARS-COV-2 RNA,(COVID-19) QUAL NAAT   Next appt:  01/25/2020 and as needed  Georges Victorio Medina-Vargas - DNP, MSN, FNP-BC [facility] [phone] (Monday-Friday 8:00 a.m. - 5:00 p.m.) [phone] (after hours)

## 2019-12-26 NOTE — Patient Instructions (Signed)
COVID-19 COVID-19 is a respiratory infection that is caused by a virus called severe acute respiratory syndrome coronavirus 2 (SARS-CoV-2). The disease is also known as coronavirus disease or novel coronavirus. In some people, the virus may not cause any symptoms. In others, it may cause a serious infection. The infection can get worse quickly and can lead to complications, such as:  Pneumonia, or infection of the lungs.  Acute respiratory distress syndrome or ARDS. This is a condition in which fluid build-up in the lungs prevents the lungs from filling with air and passing oxygen into the blood.  Acute respiratory failure. This is a condition in which there is not enough oxygen passing from the lungs to the body or when carbon dioxide is not passing from the lungs out of the body.  Sepsis or septic shock. This is a serious bodily reaction to an infection.  Blood clotting problems.  Secondary infections due to bacteria or fungus.  Organ failure. This is when your body's organs stop working. The virus that causes COVID-19 is contagious. This means that it can spread from person to person through droplets from coughs and sneezes (respiratory secretions). What are the causes? This illness is caused by a virus. You may catch the virus by:  Breathing in droplets from an infected person. Droplets can be spread by a person breathing, speaking, singing, coughing, or sneezing.  Touching something, like a table or a doorknob, that was exposed to the virus (contaminated) and then touching your mouth, nose, or eyes. What increases the risk? Risk for infection You are more likely to be infected with this virus if you:  Are within 6 feet (2 meters) of a person with COVID-19.  Provide care for or live with a person who is infected with COVID-19.  Spend time in crowded indoor spaces or live in shared housing. Risk for serious illness You are more likely to become seriously ill from the virus if you:   Are 50 years of age or older. The higher your age, the more you are at risk for serious illness.  Live in a nursing home or long-term care facility.  Have cancer.  Have a long-term (chronic) disease such as: ? Chronic lung disease, including chronic obstructive pulmonary disease or asthma. ? A long-term disease that lowers your body's ability to fight infection (immunocompromised). ? Heart disease, including heart failure, a condition in which the arteries that lead to the heart become narrow or blocked (coronary artery disease), a disease which makes the heart muscle thick, weak, or stiff (cardiomyopathy). ? Diabetes. ? Chronic kidney disease. ? Sickle cell disease, a condition in which red blood cells have an abnormal "sickle" shape. ? Liver disease.  Are obese. What are the signs or symptoms? Symptoms of this condition can range from mild to severe. Symptoms may appear any time from 2 to 14 days after being exposed to the virus. They include:  A fever or chills.  A cough.  Difficulty breathing.  Headaches, body aches, or muscle aches.  Runny or stuffy (congested) nose.  A sore throat.  New loss of taste or smell. Some people may also have stomach problems, such as nausea, vomiting, or diarrhea. Other people may not have any symptoms of COVID-19. How is this diagnosed? This condition may be diagnosed based on:  Your signs and symptoms, especially if: ? You live in an area with a COVID-19 outbreak. ? You recently traveled to or from an area where the virus is common. ? You   provide care for or live with a person who was diagnosed with COVID-19. ? You were exposed to a person who was diagnosed with COVID-19.  A physical exam.  Lab tests, which may include: ? Taking a sample of fluid from the back of your nose and throat (nasopharyngeal fluid), your nose, or your throat using a swab. ? A sample of mucus from your lungs (sputum). ? Blood tests.  Imaging tests, which  may include, X-rays, CT scan, or ultrasound. How is this treated? At present, there is no medicine to treat COVID-19. Medicines that treat other diseases are being used on a trial basis to see if they are effective against COVID-19. Your health care provider will talk with you about ways to treat your symptoms. For most people, the infection is mild and can be managed at home with rest, fluids, and over-the-counter medicines. Treatment for a serious infection usually takes places in a hospital intensive care unit (ICU). It may include one or more of the following treatments. These treatments are given until your symptoms improve.  Receiving fluids and medicines through an IV.  Supplemental oxygen. Extra oxygen is given through a tube in the nose, a face mask, or a hood.  Positioning you to lie on your stomach (prone position). This makes it easier for oxygen to get into the lungs.  Continuous positive airway pressure (CPAP) or bi-level positive airway pressure (BPAP) machine. This treatment uses mild air pressure to keep the airways open. A tube that is connected to a motor delivers oxygen to the body.  Ventilator. This treatment moves air into and out of the lungs by using a tube that is placed in your windpipe.  Tracheostomy. This is a procedure to create a hole in the neck so that a breathing tube can be inserted.  Extracorporeal membrane oxygenation (ECMO). This procedure gives the lungs a chance to recover by taking over the functions of the heart and lungs. It supplies oxygen to the body and removes carbon dioxide. Follow these instructions at home: Lifestyle  If you are sick, stay home except to get medical care. Your health care provider will tell you how long to stay home. Call your health care provider before you go for medical care.  Rest at home as told by your health care provider.  Do not use any products that contain nicotine or tobacco, such as cigarettes, e-cigarettes, and  chewing tobacco. If you need help quitting, ask your health care provider.  Return to your normal activities as told by your health care provider. Ask your health care provider what activities are safe for you. General instructions  Take over-the-counter and prescription medicines only as told by your health care provider.  Drink enough fluid to keep your urine pale yellow.  Keep all follow-up visits as told by your health care provider. This is important. How is this prevented?  There is no vaccine to help prevent COVID-19 infection. However, there are steps you can take to protect yourself and others from this virus. To protect yourself:   Do not travel to areas where COVID-19 is a risk. The areas where COVID-19 is reported change often. To identify high-risk areas and travel restrictions, check the CDC travel website: wwwnc.cdc.gov/travel/notices  If you live in, or must travel to, an area where COVID-19 is a risk, take precautions to avoid infection. ? Stay away from people who are sick. ? Wash your hands often with soap and water for 20 seconds. If soap and water   are not available, use an alcohol-based hand sanitizer. ? Avoid touching your mouth, face, eyes, or nose. ? Avoid going out in public, follow guidance from your state and local health authorities. ? If you must go out in public, wear a cloth face covering or face mask. Make sure your mask covers your nose and mouth. ? Avoid crowded indoor spaces. Stay at least 6 feet (2 meters) away from others. ? Disinfect objects and surfaces that are frequently touched every day. This may include:  Counters and tables.  Doorknobs and light switches.  Sinks and faucets.  Electronics, such as phones, remote controls, keyboards, computers, and tablets. To protect others: If you have symptoms of COVID-19, take steps to prevent the virus from spreading to others.  If you think you have a COVID-19 infection, contact your health care  provider right away. Tell your health care team that you think you may have a COVID-19 infection.  Stay home. Leave your house only to seek medical care. Do not use public transport.  Do not travel while you are sick.  Wash your hands often with soap and water for 20 seconds. If soap and water are not available, use alcohol-based hand sanitizer.  Stay away from other members of your household. Let healthy household members care for children and pets, if possible. If you have to care for children or pets, wash your hands often and wear a mask. If possible, stay in your own room, separate from others. Use a different bathroom.  Make sure that all people in your household wash their hands well and often.  Cough or sneeze into a tissue or your sleeve or elbow. Do not cough or sneeze into your hand or into the air.  Wear a cloth face covering or face mask. Make sure your mask covers your nose and mouth. Where to find more information  Centers for Disease Control and Prevention: www.cdc.gov/coronavirus/2019-ncov/index.html  World Health Organization: www.who.int/health-topics/coronavirus Contact a health care provider if:  You live in or have traveled to an area where COVID-19 is a risk and you have symptoms of the infection.  You have had contact with someone who has COVID-19 and you have symptoms of the infection. Get help right away if:  You have trouble breathing.  You have pain or pressure in your chest.  You have confusion.  You have bluish lips and fingernails.  You have difficulty waking from sleep.  You have symptoms that get worse. These symptoms may represent a serious problem that is an emergency. Do not wait to see if the symptoms will go away. Get medical help right away. Call your local emergency services (911 in the [place] Do not drive yourself to the hospital. Let the emergency medical personnel know if you think you have COVID-19. Summary  COVID-19 is a  respiratory infection that is caused by a virus. It is also known as coronavirus disease or novel coronavirus. It can cause serious infections, such as pneumonia, acute respiratory distress syndrome, acute respiratory failure, or sepsis.  The virus that causes COVID-19 is contagious. This means that it can spread from person to person through droplets from breathing, speaking, singing, coughing, or sneezing.  You are more likely to develop a serious illness if you are 50 years of age or older, have a weak immune system, live in a nursing home, or have chronic disease.  There is no medicine to treat COVID-19. Your health care provider will talk with you about ways to treat your symptoms.    Take steps to protect yourself and others from infection. Wash your hands often and disinfect objects and surfaces that are frequently touched every day. Stay away from people who are sick and wear a mask if you are sick. This information is not intended to replace advice given to you by your health care provider. Make sure you discuss any questions you have with your health care provider. Document Revised: 01/21/2019 Document Reviewed: 04/29/2018 Elsevier Patient Education  2020 [employer].  

## 2019-12-27 LAB — SARS-COV-2 RNA,(COVID-19) QUALITATIVE NAAT: SARS CoV2 RNA: DETECTED — CR

## 2019-12-27 NOTE — Progress Notes (Signed)
Notify patient and quarantine for 2 weeks. Notify her contacts for the past week. Continue taking medications and notify [facility] if symptoms does not resolve.

## 2019-12-29 ENCOUNTER — Other Ambulatory Visit: Payer: Self-pay

## 2019-12-29 ENCOUNTER — Telehealth: Payer: Self-pay

## 2019-12-29 ENCOUNTER — Telehealth (INDEPENDENT_AMBULATORY_CARE_PROVIDER_SITE_OTHER): Payer: Medicare HMO | Admitting: Nurse Practitioner

## 2019-12-29 DIAGNOSIS — U071 COVID-19: Secondary | ICD-10-CM | POA: Diagnosis not present

## 2019-12-29 NOTE — Telephone Encounter (Signed)
Spoke with patient, scheduled an appointment for 2:15 pm today (video visit)

## 2019-12-29 NOTE — Telephone Encounter (Signed)
Lets make her an appt for COVID follow up, virtual visit please

## 2019-12-29 NOTE — Telephone Encounter (Signed)
Incoming call received form patient stating she was seen on Monday and diagnosed with Covid.  Patient has a terrible headache, sinus all dried up, pills were given for diarrhea, the diarrhea has stopped. Patient with no BM since Monday and that is abnormal for her, patient usually has a BM daily.  Patient states " My stomach is acting up, I don't know how to describe it, I just feel really hungry and it is bothering me, yet I am not able to eat much."   Patient would like to know if she should continue antibiotic and prednisone.    Patient indicates it is ok to leave a detailed message on voicemail if she is not available when call returned.

## 2019-12-29 NOTE — Progress Notes (Signed)
This service is provided via telemedicine  No vital signs collected/recorded due to the encounter was a telemedicine visit.   Location of patient (ex: home, work):  Home  Patient consents to a telephone visit:  Yes, see encounter dated 09/28/2019  Location of the provider (ex: office, home):  [facility]  Name of any referring provider:  N/A  Names of all persons participating in the telemedicine service and their role in the encounter:  [name], Nurse Practitioner, Elveria Royals, CMA, and patient.        Careteam: Patient Care Team: [name], NP as PCP - General (Geriatric Medicine)  Advanced Directive information    Allergies  Allergen Reactions  . Amoxicillin Diarrhea    Chief Complaint  Patient presents with  . Follow-up    COVID follow up. Patient states she is not feeling well. Patient is having a headache and stomach pain. Patient wants to be sure she is not having an allergic reaction to medications for COVID. No SOB.     HPI: Patient is a 79 y.o. female for video visit.  She is COVID positive.  She was seen in office and was placed on doxycycline for 10 day for prevention and decadron  Has a loose stool this morning, this is the first BM since Monday.  She has had a lot of stomach issues since COVID Some shortness of breath earlier this week but now this has improved.  Having a lot of nausea and decrease appetite. She has also had loss of taste and smell.  No sharp pains, just "does not feel good" Hungry but sick to her stomach when she eats.  Drinking water every hour.   Review of Systems:  Review of Systems  Constitutional: Positive for malaise/fatigue. Negative for chills and fever.  Respiratory: Negative for cough and shortness of breath.   Cardiovascular: Negative for chest pain and leg swelling.  Gastrointestinal: Positive for diarrhea and nausea. Negative for abdominal pain, blood in stool and  constipation.       Upset stomach    Past Medical History:  Diagnosis Date  . Anxiety and depression   . Cataract   . 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 GERD (gastroesophageal reflux disease)   . Hyperlipemia   . Hypertension   . Osteoarthritis   . Thyroid disease    Past Surgical History:  Procedure Laterality Date  . ABDOMINAL HYSTERECTOMY  1978  . BREAST LUMPECTOMY  1980  . SALIVARY STONE REMOVAL  1980's  . TONSILLECTOMY  1950's   Social History:   reports that she has never smoked. She has never used smokeless tobacco. She reports that she does not drink alcohol and does not use drugs.  Family History  Problem Relation Age of Onset  . Hypertension Mother   . Heart disease Mother   . Heart attack Father 47  . Heart disease Father   . Heart attack Sister   . Arthritis Sister   . Diabetes Brother     Medications: Patient's Medications  New Prescriptions   No medications on file  Previous Medications   ACETAMINOPHEN (TYLENOL) 500 MG TABLET    Take 500 mg by mouth 2 (two) times daily.   ASPIRIN EC 81 MG TABLET    Take 81 mg by mouth daily.   CHOLECALCIFEROL (VITAMIN D3) 2000 UNITS TABS    Take 1 tablet by mouth daily.   DEXAMETHASONE (DECADRON) 4  MG TABLET    Take 1 tablet (4 mg total) by mouth daily for 10 days.   DOXYCYCLINE (VIBRA-TABS) 100 MG TABLET    Take 1 tablet (100 mg total) by mouth 2 (two) times daily for 10 days.   DULOXETINE (CYMBALTA) 60 MG CAPSULE    Take 1 capsule (60 mg total) by mouth daily.   ESOMEPRAZOLE (NEXIUM) 40 MG CAPSULE    Take 1 capsule (40 mg total) by mouth daily at 12 noon.   GLUCOSAMINE-CHONDROITIN 500-400 MG TABLET    Take 1 tablet by mouth 2 (two) times daily.   KRILL OIL (OMEGA-3) 500 MG CAPS    Take 1 capsule by mouth 2 (two) times daily.   LISINOPRIL-HYDROCHLOROTHIAZIDE (ZESTORETIC) 10-12.5 MG TABLET    Take 1 tablet by mouth daily.   LOPERAMIDE (IMODIUM A-D) 2 MG TABLET    Take 1 tablet (2 mg total) by  mouth 3 (three) times daily as needed for diarrhea or loose stools (for diarrhea).   METOPROLOL SUCCINATE (TOPROL-XL) 50 MG 24 HR TABLET    Take 1 tablet (50 mg total) by mouth daily. Take with or immediately following a meal.   PROBIOTIC PRODUCT (PROBIOTIC DAILY PO)    Take 1 tablet by mouth daily.   ROSUVASTATIN (CRESTOR) 5 MG TABLET    1 tablet by mouth three times weekly   TURMERIC CURCUMIN 500 MG CAPS    Take 1 capsule by mouth 2 (two) times daily.   ZINC SULFATE 220 (50 ZN) MG CAPSULE    Take 1 capsule (220 mg total) by mouth daily.  Modified Medications   No medications on file  Discontinued Medications   No medications on file    Physical Exam:  There were no vitals filed for this visit. There is no height or weight on file to calculate BMI. Wt Readings from Last 3 Encounters:  12/26/19 208 lb 3.2 oz (94.4 kg)  08/08/19 208 lb 3.2 oz (94.4 kg)  08/08/19 208 lb (94.3 kg)     Labs reviewed: Basic Metabolic Panel: Recent Labs    03/14/19 0931 07/20/19 1353 09/26/19 0920  NA 141 139 140  K 4.6 4.3 4.3  CL 106 103 104  CO2 25 26 25   GLUCOSE 103* 107 104*  BUN 19 17 19   CREATININE 0.67 0.75 0.72  CALCIUM 9.6 9.8 9.6  TSH  --  0.90  --    Liver Function Tests: Recent Labs    07/20/19 1353 09/26/19 0920  AST 19 14  ALT 14 13  BILITOT 0.7 0.6  PROT 6.5 6.3   No results for input(s): LIPASE, AMYLASE in the last 8760 hours. No results for input(s): AMMONIA in the last 8760 hours. CBC: Recent Labs    07/14/19 0000  WBC 10.3  HGB 15.2  HCT 48*  PLT 304   Lipid Panel: Recent Labs    03/14/19 0931 09/26/19 0920  CHOL 144 157  HDL 52 62  LDLCALC 66 73  TRIG 185* 140  CHOLHDL 2.8 2.5   TSH: Recent Labs    07/20/19 1353  TSH 0.90   A1C: Lab Results  Component Value Date   HGBA1C 6.1 (H) 11/01/2018     Assessment/Plan 1. COVID-19 Positive COVID-19, with ongoing symptoms but doing well without life threatening symptoms. Continue on  doxycycline and dexamethasone that has been prescribed until complete. To add probiotic twice daily -make sure you are staying well hydrated -recommended to take Vit C 1000 mg twice daily, Vit D  5000 units, zinc 50 mg daily for 14 days -to do deep breathing exercises every hour -mucinex twice daily as needed for chest congestion -do not sit in bed all day, sit up in chair and walk around as tolerated -leg exercises to prevent blood clots in leg -continue on ASA EC 81 mg daily  -education provided on when to follow up and when to seek immediate medication attention through the ED.   Next appt: 01/02/2020 Janene Harvey. Biagio Borg  [facility] [phone]    Virtual Visit via my chart  I connected with patient on 12/29/19 at  2:00 PM EDT by video and verified that I am speaking with the correct person using two identifiers.  Location: Patient: home Provider: [facility]   I discussed the limitations, risks, security and privacy concerns of performing an evaluation and management service by telephone and the availability of in person appointments. I also discussed with the patient that there may be a patient responsible charge related to this service. The patient expressed understanding and agreed to proceed.   I discussed the assessment and treatment plan with the patient. The patient was provided an opportunity to ask questions and all were answered. The patient agreed with the plan and demonstrated an understanding of the instructions.   The patient was advised to call back or seek an in-person evaluation if the symptoms worsen or if the condition fails to improve as anticipated.  I provided 20 minutes of non-face-to-face time during this encounter.  Janene Harvey. Biagio Borg Avs printed and mailed

## 2020-01-02 ENCOUNTER — Telehealth: Payer: Self-pay | Admitting: Nurse Practitioner

## 2020-01-02 NOTE — Telephone Encounter (Signed)
Patient was seen last week for Telephone Visit.  Stated that she is not feeling any better. Still having stomach issues but not as bad as it was was.  Stated that she is hungry and thirsty all the time. Wanted to know if there should be something added to the medications that she is taking. Stated that she has a few days of them left of the Doxyxycline and Decadron.   Please Advise.        She is COVID positive.  She was seen in office and was placed on doxycycline for 10 day for prevention and decadron

## 2020-01-02 NOTE — Telephone Encounter (Signed)
Hey Mckaela called and said she had two white tablets and three oink ones left and she takes two pink a day and one white a day and she didnt know what else she needs to do because she is not feeling any better and she is very thirsty all the time she said it may be her sugar but she does drink a lot of water.  Thank you, Ladona Ridgel

## 2020-01-02 NOTE — Telephone Encounter (Signed)
Would continue medication as prescribed Probiotic twice daily Continue to stay hydrated with fluids and advance diet as tolerates, it could take 2 weeks or longer due to COVID to start feeling better

## 2020-01-02 NOTE — Telephone Encounter (Signed)
Patient notified and agreed.  

## 2020-01-06 ENCOUNTER — Encounter: Payer: Medicare HMO | Admitting: Family

## 2020-01-06 ENCOUNTER — Other Ambulatory Visit: Payer: Self-pay

## 2020-01-06 ENCOUNTER — Telehealth: Payer: Self-pay

## 2020-01-06 NOTE — Telephone Encounter (Signed)
3 unsuccessful attempts made to call patient. At 9:56am, 10:07am, and 11:08am. Patient left voicemail on all calls and notified to reschedule visit.Marland Kitchen

## 2020-01-09 ENCOUNTER — Encounter: Payer: Self-pay | Admitting: Nurse Practitioner

## 2020-01-09 ENCOUNTER — Other Ambulatory Visit: Payer: Self-pay

## 2020-01-09 ENCOUNTER — Telehealth (INDEPENDENT_AMBULATORY_CARE_PROVIDER_SITE_OTHER): Payer: Medicare HMO | Admitting: Nurse Practitioner

## 2020-01-09 DIAGNOSIS — K580 Irritable bowel syndrome with diarrhea: Secondary | ICD-10-CM

## 2020-01-09 DIAGNOSIS — K219 Gastro-esophageal reflux disease without esophagitis: Secondary | ICD-10-CM

## 2020-01-09 DIAGNOSIS — U071 COVID-19: Secondary | ICD-10-CM | POA: Diagnosis not present

## 2020-01-09 DIAGNOSIS — R631 Polydipsia: Secondary | ICD-10-CM

## 2020-01-09 NOTE — Progress Notes (Signed)
   This service is provided via telemedicine  No vital signs collected/recorded due to the encounter was a telemedicine visit.   Location of patient (ex: home, work):  Home  Patient consents to a telephone visit:  Yes, see telephone encounter dated 09/28/2019 with annual consent   Location of the provider (ex: office, home):[facility], Office   Name of any referring provider: n/a  Names of all persons participating in the telemedicine service and their role in the encounter: S.Chrae B/CMA, [name], NP, and Patient   Time spent on call:  7 min with medical assistant

## 2020-01-09 NOTE — Progress Notes (Signed)
Careteam: Patient Care Team: Lauree Chandler, NP as PCP - General (Geriatric Medicine)  Advanced Directive information    Allergies  Allergen Reactions  . Amoxicillin Diarrhea    Chief Complaint  Patient presents with  . Acute Visit    Covid follow-up. Patient c/o stomach ache and severe thirst.  Telephone visit      HPI: Patient is a 79 y.o. female for COVID follow up.  Reports she is fine except her stomach is hurting.  Has to eat every 2 hours to prevent her stomach from hurting.  Reports she is having diarrhea. Took imodium and has not had a BM since then. Consistently thirsty.  Will have diarrhea only once every 2-3 days.  When she eats it helps.  Has stopped nexium- was told to stop when she got diagnosised with COVID.   127/62 60   Review of Systems:  Review of Systems  Constitutional: Negative for chills and fever.  HENT: Negative for tinnitus.   Respiratory: Negative for cough, sputum production and shortness of breath.   Cardiovascular: Negative for chest pain, palpitations and leg swelling.  Gastrointestinal: Positive for diarrhea and heartburn. Negative for abdominal pain, constipation, nausea and vomiting.  Genitourinary: Negative for dysuria, frequency and urgency.  Musculoskeletal: Negative for back pain, joint pain and myalgias.  Skin: Negative.   Neurological: Negative for dizziness and headaches.    Past Medical History:  Diagnosis Date  . Anxiety and depression   . Cataract   . 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 GERD (gastroesophageal reflux disease)   . Hyperlipemia   . Hypertension   . Osteoarthritis   . Thyroid disease    Past Surgical History:  Procedure Laterality Date  . ABDOMINAL HYSTERECTOMY  1978  . BREAST LUMPECTOMY  1980  . SALIVARY STONE REMOVAL  1980's  . TONSILLECTOMY  1950's   Social History:   reports that she has never smoked. She has never used smokeless tobacco. She reports that she  does not drink alcohol and does not use drugs.  Family History  Problem Relation Age of Onset  . Hypertension Mother   . Heart disease Mother   . Heart attack Father 3  . Heart disease Father   . Heart attack Sister   . Arthritis Sister   . Diabetes Brother     Medications: Patient's Medications  New Prescriptions   No medications on file  Previous Medications   ACETAMINOPHEN (TYLENOL) 500 MG TABLET    Take 500 mg by mouth 2 (two) times daily.   ASPIRIN EC 81 MG TABLET    Take 81 mg by mouth daily.   CHOLECALCIFEROL (VITAMIN D3) 2000 UNITS TABS    Take 1 tablet by mouth daily.   DULOXETINE (CYMBALTA) 60 MG CAPSULE    Take 1 capsule (60 mg total) by mouth daily.   ESOMEPRAZOLE (NEXIUM) 40 MG CAPSULE    Take 1 capsule (40 mg total) by mouth daily at 12 noon.   GLUCOSAMINE-CHONDROITIN 500-400 MG TABLET    Take 1 tablet by mouth 2 (two) times daily.   KRILL OIL (OMEGA-3) 500 MG CAPS    Take 1 capsule by mouth 2 (two) times daily.   LISINOPRIL-HYDROCHLOROTHIAZIDE (ZESTORETIC) 10-12.5 MG TABLET    Take 1 tablet by mouth daily.   LOPERAMIDE (IMODIUM A-D) 2 MG TABLET    Take 1 tablet (2 mg total) by mouth 3 (three) times daily as needed for diarrhea or  loose stools (for diarrhea).   METOPROLOL SUCCINATE (TOPROL-XL) 50 MG 24 HR TABLET    Take 1 tablet (50 mg total) by mouth daily. Take with or immediately following a meal.   PROBIOTIC PRODUCT (PROBIOTIC DAILY PO)    Take 1 tablet by mouth in the morning and at bedtime.    ROSUVASTATIN (CRESTOR) 5 MG TABLET    1 tablet by mouth three times weekly   TURMERIC CURCUMIN 500 MG CAPS    Take 1 capsule by mouth 2 (two) times daily.   ZINC SULFATE 220 (50 ZN) MG CAPSULE    Take 1 capsule (220 mg total) by mouth daily.  Modified Medications   No medications on file  Discontinued Medications   No medications on file    Physical Exam:  There were no vitals filed for this visit. There is no height or weight on file to calculate BMI. Wt Readings  from Last 3 Encounters:  12/26/19 208 lb 3.2 oz (94.4 kg)  08/08/19 208 lb 3.2 oz (94.4 kg)  08/08/19 208 lb (94.3 kg)      Labs reviewed: Basic Metabolic Panel: Recent Labs    03/14/19 0931 07/20/19 1353 09/26/19 0920  NA 141 139 140  K 4.6 4.3 4.3  CL 106 103 104  CO2 25 26 25   GLUCOSE 103* 107 104*  BUN 19 17 19   CREATININE 0.67 0.75 0.72  CALCIUM 9.6 9.8 9.6  TSH  --  0.90  --    Liver Function Tests: Recent Labs    07/20/19 1353 09/26/19 0920  AST 19 14  ALT 14 13  BILITOT 0.7 0.6  PROT 6.5 6.3   No results for input(s): LIPASE, AMYLASE in the last 8760 hours. No results for input(s): AMMONIA in the last 8760 hours. CBC: Recent Labs    07/14/19 0000  WBC 10.3  HGB 15.2  HCT 48*  PLT 304   Lipid Panel: Recent Labs    03/14/19 0931 09/26/19 0920  CHOL 144 157  HDL 52 62  LDLCALC 66 73  TRIG 185* 140  CHOLHDL 2.8 2.5   TSH: Recent Labs    07/20/19 1353  TSH 0.90   A1C: Lab Results  Component Value Date   HGBA1C 6.1 (H) 11/01/2018     Assessment/Plan 1. COVID-19 Overall feeling much better except for GI issues, she has stopped nexium so will restart at this time to see if this improves symptoms.   2. Irritable bowel syndrome with diarrhea Ongoing, encouraged to start probiotic twice daily at this time.  3. Gastro-esophageal reflux disease without esophagitis To restart nexium to see if this does not improve GI symptoms.  4. Excessive thirst -ongoing, no reported tinting to skin, drinking plenty of water -not on any additional OTC, anticholinergics -will follow up lab  - CBC with Differential/Platelet; Future - BMP with eGFR([facility]); Future  Next appt: 01/25/2020, sooner if needed  Janett Billow K. Harle Battiest  [facility] [phone]    Virtual Visit via Mychart  I connected with patient on 01/09/20 at  8:30 AM EDT by video and verified that I am speaking with the correct person using two  identifiers.  Location: Patient: home Provider: [place]   I discussed the limitations, risks, security and privacy concerns of performing an evaluation and management service by telephone and the availability of in person appointments. I also discussed with the patient that there may be a patient responsible charge related to this service. The patient expressed  understanding and agreed to proceed.   I discussed the assessment and treatment plan with the patient. The patient was provided an opportunity to ask questions and all were answered. The patient agreed with the plan and demonstrated an understanding of the instructions.   The patient was advised to call back or seek an in-person evaluation if the symptoms worsen or if the condition fails to improve as anticipated.  I provided 16 minutes of non-face-to-face time during this encounter.  Carlos American. Harle Battiest Avs printed and mailed

## 2020-01-11 ENCOUNTER — Other Ambulatory Visit: Payer: Medicare HMO

## 2020-01-11 ENCOUNTER — Other Ambulatory Visit: Payer: Self-pay

## 2020-01-11 ENCOUNTER — Telehealth: Payer: Self-pay

## 2020-01-11 DIAGNOSIS — R631 Polydipsia: Secondary | ICD-10-CM | POA: Diagnosis not present

## 2020-01-11 DIAGNOSIS — R739 Hyperglycemia, unspecified: Secondary | ICD-10-CM | POA: Diagnosis not present

## 2020-01-11 DIAGNOSIS — E785 Hyperlipidemia, unspecified: Secondary | ICD-10-CM | POA: Diagnosis not present

## 2020-01-11 NOTE — Telephone Encounter (Signed)
Patient came in for lab work this morning, but wanted to know if she could get something for her nerves. Her son is on a ventilator because of COVID and just needs something to calm her, even if it's just 5 pills. Please advise.

## 2020-01-11 NOTE — Telephone Encounter (Signed)
Called patient and told her that she would need an appointment to discuss the need for something for her nerves, she stated that she did not want to come in and that she is fine. She also stated that she doesn't need it and she doesn't want it. She said she just wants to focus on getting her son-in-law out of the hospital and that once he is out of the hospital she will be fine. Patient said that she appreciates everything.

## 2020-01-11 NOTE — Telephone Encounter (Signed)
She will need a visit to discuss this.  

## 2020-01-11 NOTE — Telephone Encounter (Signed)
Noted  

## 2020-01-16 ENCOUNTER — Telehealth: Payer: Self-pay

## 2020-01-16 ENCOUNTER — Telehealth: Payer: Self-pay | Admitting: Nurse Practitioner

## 2020-01-16 LAB — CBC WITH DIFFERENTIAL/PLATELET
Absolute Monocytes: 621 cells/uL (ref 200–950)
Basophils Absolute: 54 cells/uL (ref 0–200)
Basophils Relative: 0.6 %
Eosinophils Absolute: 189 cells/uL (ref 15–500)
Eosinophils Relative: 2.1 %
HCT: 44.4 % (ref 35.0–45.0)
Hemoglobin: 14.2 g/dL (ref 11.7–15.5)
Lymphs Abs: 3744 cells/uL (ref 850–3900)
MCH: 29.2 pg (ref 27.0–33.0)
MCHC: 32 g/dL (ref 32.0–36.0)
MCV: 91.4 fL (ref 80.0–100.0)
MPV: 10.5 fL (ref 7.5–12.5)
Monocytes Relative: 6.9 %
Neutro Abs: 4392 cells/uL (ref 1500–7800)
Neutrophils Relative %: 48.8 %
Platelets: 303 10*3/uL (ref 140–400)
RBC: 4.86 10*6/uL (ref 3.80–5.10)
RDW: 12.5 % (ref 11.0–15.0)
Total Lymphocyte: 41.6 %
WBC: 9 10*3/uL (ref 3.8–10.8)

## 2020-01-16 LAB — TEST AUTHORIZATION

## 2020-01-16 LAB — TEST AUTHORIZATION 2

## 2020-01-16 LAB — BASIC METABOLIC PANEL WITH GFR
BUN: 16 mg/dL (ref 7–25)
CO2: 24 mmol/L (ref 20–32)
Calcium: 9.1 mg/dL (ref 8.6–10.4)
Chloride: 106 mmol/L (ref 98–110)
Creat: 0.76 mg/dL (ref 0.60–0.88)
GFR, Est African American: 86 mL/min/{1.73_m2} (ref >=60)
GFR, Est Non African American: 74 mL/min/{1.73_m2} (ref >=60)
Glucose, Bld: 105 mg/dL — ABNORMAL HIGH (ref 65–99)
Potassium: 4.1 mmol/L (ref 3.5–5.3)
Sodium: 140 mmol/L (ref 135–146)

## 2020-01-16 LAB — LIPID PANEL
Cholesterol: 122 mg/dL (ref <200)
HDL: 35 mg/dL — ABNORMAL LOW (ref >=50)
LDL Cholesterol (Calc): 58 mg/dL (calc)
Non-HDL Cholesterol (Calc): 87 mg/dL (calc) (ref <130)
Total CHOL/HDL Ratio: 3.5 (calc) (ref <5.0)
Triglycerides: 229 mg/dL — ABNORMAL HIGH (ref <150)

## 2020-01-16 LAB — HEMOGLOBIN A1C
Hgb A1c MFr Bld: 6.4 % of total Hgb — ABNORMAL HIGH (ref <5.7)
Mean Plasma Glucose: 137 (calc)
eAG (mmol/L): 7.6 (calc)

## 2020-01-16 NOTE — Telephone Encounter (Signed)
Sounds good I will call her back and let her know to keep the follow up appt.

## 2020-01-16 NOTE — Telephone Encounter (Signed)
I called and spoke with patient about the lab appointment and the addition of the lab and that she was to keep her up coming appointment per Shanda Bumps

## 2020-01-16 NOTE — Telephone Encounter (Signed)
April Hill called and wanted to know if she still needs to come in for labwork on 10/20, she just had labs done this past week on 10/6 so she just wanted to know if she needs to do more labs or did you get everything last week? Also does she need to keep her follow up appt for 10/25? She was just seen because she tested positive for Covid.

## 2020-01-16 NOTE — Telephone Encounter (Signed)
She is due for Lipids, was unaware this appt was upcoming, maybe Steward Drone can add to labs that were already done. Kennon Rounds you will need to request this ASAP to see. I would like her to keep the follow up appt.

## 2020-01-21 NOTE — Progress Notes (Signed)
  This encounter was created in error - please disregard.Appointment cancelled CMA attempted x 3 to contact patient.

## 2020-01-25 ENCOUNTER — Other Ambulatory Visit: Payer: Medicare HMO

## 2020-01-30 ENCOUNTER — Other Ambulatory Visit: Payer: Self-pay

## 2020-01-30 ENCOUNTER — Encounter: Payer: Self-pay | Admitting: Nurse Practitioner

## 2020-01-30 ENCOUNTER — Ambulatory Visit (INDEPENDENT_AMBULATORY_CARE_PROVIDER_SITE_OTHER): Payer: Medicare HMO | Admitting: Nurse Practitioner

## 2020-01-30 VITALS — BP 140/86 | HR 84 | Temp 97.1°F | Ht 64.0 in | Wt 200.0 lb

## 2020-01-30 DIAGNOSIS — U099 Post covid-19 condition, unspecified: Secondary | ICD-10-CM | POA: Diagnosis not present

## 2020-01-30 DIAGNOSIS — H9193 Unspecified hearing loss, bilateral: Secondary | ICD-10-CM

## 2020-01-30 DIAGNOSIS — R5383 Other fatigue: Secondary | ICD-10-CM

## 2020-01-30 DIAGNOSIS — F419 Anxiety disorder, unspecified: Secondary | ICD-10-CM | POA: Diagnosis not present

## 2020-01-30 DIAGNOSIS — K219 Gastro-esophageal reflux disease without esophagitis: Secondary | ICD-10-CM

## 2020-01-30 DIAGNOSIS — K580 Irritable bowel syndrome with diarrhea: Secondary | ICD-10-CM | POA: Diagnosis not present

## 2020-01-30 DIAGNOSIS — E782 Mixed hyperlipidemia: Secondary | ICD-10-CM

## 2020-01-30 DIAGNOSIS — F32A Depression, unspecified: Secondary | ICD-10-CM

## 2020-01-30 DIAGNOSIS — Z23 Encounter for immunization: Secondary | ICD-10-CM

## 2020-01-30 DIAGNOSIS — I10 Essential (primary) hypertension: Secondary | ICD-10-CM

## 2020-01-30 MED ORDER — ROSUVASTATIN CALCIUM 5 MG PO TABS: ORAL_TABLET | ORAL | 1 refills | Status: DC

## 2020-01-30 NOTE — Patient Instructions (Addendum)
To stop zinc  To continue probiotic  To continue crestor

## 2020-01-30 NOTE — Progress Notes (Signed)
Careteam: Patient Care Team: Lauree Chandler, NP as PCP - General (Geriatric Medicine)  PLACE OF SERVICE:  [facility] Directive information Does Patient Have a Medical Advance Directive?: No;Yes, Type of Advance Directive: Out of facility DNR (pink MOST or yellow form), Pre-existing out of facility DNR order (yellow form or pink MOST form): Yellow form placed in chart (order not valid for inpatient use);Pink MOST form placed in chart (order not valid for inpatient use), Does patient want to make changes to medical advance directive?: No - Patient declined  Allergies  Allergen Reactions  . Amoxicillin Diarrhea    Chief Complaint  Patient presents with  . Medical Management of Chronic Issues    4 month follow-up, here with daughter Olivia Mackie. Discuss need for DEXA and covid 19 vaccines.  Discuss if patient needs to continue Crestor. Pateint would like to discuss SOB, fatigue, decreased hearing, and stomach issues. Symptoms ongoing since Covid diagnosis. Flu vaccine today if ok with provider.      HPI: Patient is a 79 y.o. female for routine follow up.   Pt with hx of "spastic stomach" since COVID she has had ongoing issues since COVID. Some days are better than others but having issues with feelings over "over eating" hard to describe. She had to eat every 2 hours to helps calm her stomach. She had a lot of mac and cheese and mashed potatoes because it was hard for her to decide what to eat.  Will have a good day and then the next day will be bad.  Reports she is now having normal BMs.  Hyperlipidemia- contributes elevated triglycerides due to eating a lot of mac and cheese during COVID. On crestor 5 mg daily  Reports she is short of breath and fatigued easily after COVID. oxygen levels are always good. Overall slowly improving. No as often. She is not very active. Lack of motivation to get up and get going.  Her son-in-law in the hospital still with COVID. She has a lot  of anxiety.  She was off cymbalta for a while when she had COVID.  She is not having panic now but worrying more over her son-in-law.  She was also off nexium but reports she has started in back as well. It has helped.   Reports she can not hear well at all. It was bad prior to covid but now worse. Can not hear over the phone.    Review of Systems:  Review of Systems  Constitutional: Negative for chills, fever and weight loss.  HENT: Positive for hearing loss.   Respiratory: Positive for shortness of breath (with activity. ). Negative for cough and sputum production.   Cardiovascular: Negative for chest pain, palpitations and leg swelling.  Gastrointestinal: Positive for abdominal pain. Negative for constipation, diarrhea and heartburn.  Genitourinary: Negative for dysuria, frequency and urgency.  Musculoskeletal: Negative for back pain, falls, joint pain and myalgias.  Skin: Negative.   Neurological: Negative for dizziness and headaches.  Psychiatric/Behavioral: Positive for depression. Negative for memory loss. The patient is nervous/anxious. The patient does not have insomnia.     Past Medical History:  Diagnosis Date  . Anxiety and depression   . Cataract   . 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 GERD (gastroesophageal reflux disease)   . Hyperlipemia   . Hypertension   . Osteoarthritis   . Thyroid disease    Past Surgical History:  Procedure Laterality Date  .  ABDOMINAL HYSTERECTOMY  1978  . BREAST LUMPECTOMY  1980  . SALIVARY STONE REMOVAL  1980's  . TONSILLECTOMY  1950's   Social History:   reports that she has never smoked. She has never used smokeless tobacco. She reports that she does not drink alcohol and does not use drugs.  Family History  Problem Relation Age of Onset  . Hypertension Mother   . Heart disease Mother   . Heart attack Father 37  . Heart disease Father   . Heart attack Sister   . Arthritis Sister   . Diabetes Brother      Medications: Patient's Medications  New Prescriptions   No medications on file  Previous Medications   ACETAMINOPHEN (TYLENOL) 500 MG TABLET    Take 500 mg by mouth 2 (two) times daily.   ASPIRIN EC 81 MG TABLET    Take 81 mg by mouth daily.   CHOLECALCIFEROL (VITAMIN D3) 2000 UNITS TABS    Take 1 tablet by mouth daily.   DULOXETINE (CYMBALTA) 60 MG CAPSULE    Take 1 capsule (60 mg total) by mouth daily.   ESOMEPRAZOLE (NEXIUM) 40 MG CAPSULE    Take 1 capsule (40 mg total) by mouth daily at 12 noon.   GLUCOSAMINE-CHONDROITIN 500-400 MG TABLET    Take 1 tablet by mouth 2 (two) times daily.    KRILL OIL (OMEGA-3) 500 MG CAPS    Take 1 capsule by mouth 2 (two) times daily.    LISINOPRIL-HYDROCHLOROTHIAZIDE (ZESTORETIC) 10-12.5 MG TABLET    Take 1 tablet by mouth daily.   LOPERAMIDE (IMODIUM A-D) 2 MG TABLET    Take 1 tablet (2 mg total) by mouth 3 (three) times daily as needed for diarrhea or loose stools (for diarrhea).   METOPROLOL SUCCINATE (TOPROL-XL) 50 MG 24 HR TABLET    Take 1 tablet (50 mg total) by mouth daily. Take with or immediately following a meal.   PROBIOTIC PRODUCT (PROBIOTIC DAILY PO)    Take 1 tablet by mouth in the morning and at bedtime.    ROSUVASTATIN (CRESTOR) 5 MG TABLET    1 tablet by mouth three times weekly   TURMERIC CURCUMIN 500 MG CAPS    Take 1 capsule by mouth 2 (two) times daily.    ZINC SULFATE (ZINC-220 PO)    Take 1 tablet by mouth daily.  Modified Medications   No medications on file  Discontinued Medications   No medications on file    Physical Exam:  Vitals:   01/30/20 1103  BP: 140/86  Pulse: 84  Temp: (!) 97.1 F (36.2 C)  TempSrc: Temporal  SpO2: 96%  Weight: 200 lb (90.7 kg)  Height: _0  (1.626 m)   Body mass index is 34.33 kg/m. Wt Readings from Last 3 Encounters:  01/30/20 200 lb (90.7 kg)  12/26/19 208 lb 3.2 oz (94.4 kg)  08/08/19 208 lb 3.2 oz (94.4 kg)    Physical Exam Constitutional:      General: She is not in  acute distress.    Appearance: She is well-developed. She is not diaphoretic.  HENT:     Head: Normocephalic and atraumatic.     Mouth/Throat:     Pharynx: No oropharyngeal exudate.  Eyes:     Conjunctiva/sclera: Conjunctivae normal.     Pupils: Pupils are equal, round, and reactive to light.  Cardiovascular:     Rate and Rhythm: Normal rate and regular rhythm.     Heart sounds: Normal heart sounds.  Pulmonary:  Effort: Pulmonary effort is normal.     Breath sounds: Normal breath sounds.  Abdominal:     General: Bowel sounds are normal.     Palpations: Abdomen is soft.  Musculoskeletal:        General: No tenderness.     Cervical back: Normal range of motion and neck supple.  Skin:    General: Skin is warm and dry.  Neurological:     Mental Status: She is alert and oriented to person, place, and time.  Psychiatric:        Mood and Affect: Mood normal.     Labs reviewed: Basic Metabolic Panel: Recent Labs    07/20/19 1353 09/26/19 0920 01/11/20 0811  NA 139 140 140  K 4.3 4.3 4.1  CL 103 104 106  CO2 _0 GLUCOSE 107 104* 105*  BUN _1 CREATININE 0.75 0.72 0.76  CALCIUM 9.8 9.6 9.1  TSH 0.90  --   --    Liver Function Tests: Recent Labs    07/20/19 1353 09/26/19 0920  AST 19 14  ALT 14 13  BILITOT 0.7 0.6  PROT 6.5 6.3   No results for input(s): LIPASE, AMYLASE in the last 8760 hours. No results for input(s): AMMONIA in the last 8760 hours. CBC: Recent Labs    07/14/19 0000 01/11/20 0811  WBC 10.3 9.0  NEUTROABS  --  4,392  HGB 15.2 14.2  HCT 48* 44.4  MCV  --  91.4  PLT 304 303   Lipid Panel: Recent Labs    03/14/19 0931 09/26/19 0920 01/11/20 0811  CHOL 144 157 122  HDL 52 62 35*  LDLCALC 66 73 58  TRIG 185* 140 229*  CHOLHDL 2.8 2.5 3.5   TSH: Recent Labs    07/20/19 1353  TSH 0.90   A1C: Lab Results  Component Value Date   HGBA1C 6.4 (H) 01/11/2020     Assessment/Plan 1. Irritable bowel syndrome with  diarrhea -worse since COVID, she did abruptly stop several medications that she has no restarted back. Continues on probiotic twice daily. Will stop zinc to see if this is contributing.  - Ambulatory referral to Gastroenterology  2. Gastro-esophageal reflux disease without esophagitis Improved since restarting nexium, had stopped during [place].   3. Essential hypertension -stable on current regimen, continues on metoprolol and encouraged dietary modifications.  4. Anxiety and depression -she stopped cymbalta during COVID but has now restarted. Discussed lifestyle modifications to also help with increase in anxiety such as deep breathing and keeping a journal.   5. Fatigue, unspecified type Ongoing since COVID, slowing improving.   6. Bilateral hearing loss, unspecified hearing loss type - Ambulatory referral to Audiology  7. Post-COVID syndrome -with ongoing shortness of breath and fatigue but this is improving  - Ambulatory referral to Gastroenterology  8. Mixed hyperlipidemia -LDL improved, continue to work on dietary modifications due to elevated triglycerides.  - rosuvastatin (CRESTOR) 5 MG tablet; 1 tablet by mouth three times weekly  Dispense: 40 tablet; Refill: 1  9. Need for influenza vaccination - Flu Vaccine QUAD High Dose(Fluad)   Next appt: 4 months, sooner if needed  Maryella Abood K. [place], [facility] [phone]

## 2020-02-23 ENCOUNTER — Other Ambulatory Visit: Payer: Self-pay | Admitting: Nurse Practitioner

## 2020-02-23 DIAGNOSIS — I10 Essential (primary) hypertension: Secondary | ICD-10-CM

## 2020-03-08 ENCOUNTER — Other Ambulatory Visit: Payer: Self-pay | Admitting: Gastroenterology

## 2020-03-08 DIAGNOSIS — K529 Noninfective gastroenteritis and colitis, unspecified: Secondary | ICD-10-CM | POA: Diagnosis not present

## 2020-03-08 DIAGNOSIS — K580 Irritable bowel syndrome with diarrhea: Secondary | ICD-10-CM | POA: Diagnosis not present

## 2020-03-08 DIAGNOSIS — R1319 Other dysphagia: Secondary | ICD-10-CM | POA: Diagnosis not present

## 2020-03-09 ENCOUNTER — Ambulatory Visit
Admission: RE | Admit: 2020-03-09 | Discharge: 2020-03-09 | Disposition: A | Discharge status: Home / Self Care | Payer: Medicare HMO | Source: Ambulatory Visit | Attending: Gastroenterology | Admitting: Gastroenterology

## 2020-03-09 DIAGNOSIS — R1319 Other dysphagia: Secondary | ICD-10-CM

## 2020-03-09 DIAGNOSIS — K2289 Other specified disease of esophagus: Secondary | ICD-10-CM | POA: Diagnosis not present

## 2020-03-09 DIAGNOSIS — K529 Noninfective gastroenteritis and colitis, unspecified: Secondary | ICD-10-CM | POA: Diagnosis not present

## 2020-05-07 DIAGNOSIS — H52203 Unspecified astigmatism, bilateral: Secondary | ICD-10-CM | POA: Diagnosis not present

## 2020-05-07 DIAGNOSIS — Z961 Presence of intraocular lens: Secondary | ICD-10-CM | POA: Diagnosis not present

## 2020-05-07 DIAGNOSIS — H353121 Nonexudative age-related macular degeneration, left eye, early dry stage: Secondary | ICD-10-CM | POA: Diagnosis not present

## 2020-05-07 DIAGNOSIS — H524 Presbyopia: Secondary | ICD-10-CM | POA: Diagnosis not present

## 2020-05-30 ENCOUNTER — Ambulatory Visit: Payer: Medicare HMO | Admitting: Nurse Practitioner

## 2020-06-01 ENCOUNTER — Ambulatory Visit (INDEPENDENT_AMBULATORY_CARE_PROVIDER_SITE_OTHER): Payer: Medicare HMO | Admitting: Nurse Practitioner

## 2020-06-01 ENCOUNTER — Other Ambulatory Visit: Payer: Self-pay

## 2020-06-01 ENCOUNTER — Encounter: Payer: Self-pay | Admitting: Nurse Practitioner

## 2020-06-01 VITALS — BP 130/80 | HR 61 | Temp 97.7°F | Ht 64.0 in | Wt 201.4 lb

## 2020-06-01 DIAGNOSIS — Z6834 Body mass index (BMI) 34.0-34.9, adult: Secondary | ICD-10-CM | POA: Diagnosis not present

## 2020-06-01 DIAGNOSIS — K580 Irritable bowel syndrome with diarrhea: Secondary | ICD-10-CM | POA: Diagnosis not present

## 2020-06-01 DIAGNOSIS — F419 Anxiety disorder, unspecified: Secondary | ICD-10-CM

## 2020-06-01 DIAGNOSIS — R739 Hyperglycemia, unspecified: Secondary | ICD-10-CM | POA: Diagnosis not present

## 2020-06-01 DIAGNOSIS — F32A Depression, unspecified: Secondary | ICD-10-CM

## 2020-06-01 DIAGNOSIS — M159 Polyosteoarthritis, unspecified: Secondary | ICD-10-CM

## 2020-06-01 DIAGNOSIS — E6609 Other obesity due to excess calories: Secondary | ICD-10-CM

## 2020-06-01 DIAGNOSIS — I10 Essential (primary) hypertension: Secondary | ICD-10-CM

## 2020-06-01 DIAGNOSIS — M8949 Other hypertrophic osteoarthropathy, multiple sites: Secondary | ICD-10-CM | POA: Diagnosis not present

## 2020-06-01 DIAGNOSIS — E782 Mixed hyperlipidemia: Secondary | ICD-10-CM

## 2020-06-01 MED ORDER — METOPROLOL SUCCINATE ER 50 MG PO TB24: 50.0000 mg | ORAL_TABLET | Freq: Every day | ORAL | 1 refills | Status: DC

## 2020-06-01 MED ORDER — ROSUVASTATIN CALCIUM 5 MG PO TABS: ORAL_TABLET | ORAL | 1 refills | Status: DC

## 2020-06-01 MED ORDER — DULOXETINE HCL 60 MG PO CPEP: 60.0000 mg | ORAL_CAPSULE | Freq: Every day | ORAL | 1 refills | Status: DC

## 2020-06-01 NOTE — Progress Notes (Signed)
Careteam: Patient Care Team: April Seller, NP as PCP - General (Geriatric Medicine)  PLACE OF SERVICE:  [facility]  Advanced Directive information Does Patient Have a Medical Advance Directive?: Yes, Type of Advance Directive: Out of facility DNR (pink MOST or yellow form), Pre-existing out of facility DNR order (yellow form or pink MOST form): Yellow form placed in chart (order not valid for inpatient use);Pink MOST form placed in chart (order not valid for inpatient use), Does patient want to make changes to medical advance directive?: No - Patient declined  Allergies  Allergen Reactions  . Amoxicillin Diarrhea    Chief Complaint  Patient presents with  . Medical Management of Chronic Issues    4 month follow up. Patient has been to see gastroenterologist. Patient is still having diarrhea, but not as bad as it has been. Patient stated that she fell when she tripped over her slippers.   Discuss the need for COVID vaccine and dexa scan,     HPI: Patient is a 80 y.o. female here for follow-up.   Diarrhea- ongoing. Followed by gastroenterologist who just prescribed bentyl as needed. States she might have a few diarrhea episodes a week. Maintains normal BM's otherwise. Reports that there are no specific foods that trigger it. Eats a regular diet, drinks soda products often.  Depression/fatigue- she reports she is tired of what's going on in the world, says it is taking a toll on her. Reports that she has a great family and a lot of interaction with her kids daily. Daughter takes her shopping a few times a week and she enjoys that. Reports when the weather gets better she will go outside more.   OALarey Seat on her left hand back in January while wearing her slippers. Reports minor pain but has started feeling better. Has stiffness in knees when first getting up and moving.   Hyperlipidemia- elevated triglycerides last visit, reports she does try to pick healthier food options.    Review of Systems:  Review of Systems  Constitutional: Negative for chills, fever and weight loss.  Respiratory: Negative for cough, sputum production and shortness of breath.   Cardiovascular: Negative for chest pain, palpitations and leg swelling.  Gastrointestinal: Positive for diarrhea. Negative for abdominal pain, nausea and vomiting.  Genitourinary: Negative for dysuria, frequency and urgency.  Musculoskeletal: Positive for falls, joint pain and myalgias.  Skin: Negative.   Neurological: Negative for dizziness, weakness and headaches.  Psychiatric/Behavioral: Negative for memory loss.    Past Medical History:  Diagnosis Date  . Anxiety and depression   . Cataract   . 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 Fall   . GERD (gastroesophageal reflux disease)   . Hyperlipemia   . Hypertension   . Osteoarthritis   . Thyroid disease    Past Surgical History:  Procedure Laterality Date  . ABDOMINAL HYSTERECTOMY  1978  . BREAST LUMPECTOMY  1980  . SALIVARY STONE REMOVAL  1980's  . TONSILLECTOMY  1950's   Social History:   reports that she has never smoked. She has never used smokeless tobacco. She reports that she does not drink alcohol and does not use drugs.  Family History  Problem Relation Age of Onset  . Hypertension Mother   . Heart disease Mother   . Heart attack Father 51  . Heart disease Father   . Heart attack Sister   . Arthritis Sister   . Diabetes Brother  Medications: Patient's Medications  New Prescriptions   No medications on file  Previous Medications   ACETAMINOPHEN (TYLENOL) 500 MG TABLET    Take 500 mg by mouth 2 (two) times daily.   ASPIRIN EC 81 MG TABLET    Take 81 mg by mouth daily.   CHOLECALCIFEROL (VITAMIN D3) 2000 UNITS TABS    Take 1 tablet by mouth daily.   DICYCLOMINE (BENTYL) 20 MG TABLET    20 mg daily.   DULOXETINE (CYMBALTA) 60 MG CAPSULE    Take 1 capsule (60 mg total) by mouth daily.   ESOMEPRAZOLE  (NEXIUM) 40 MG CAPSULE    Take 1 capsule (40 mg total) by mouth daily at 12 noon.   GLUCOSAMINE-CHONDROITIN 500-400 MG TABLET    Take 1 tablet by mouth 2 (two) times daily.    KRILL OIL (OMEGA-3) 500 MG CAPS    Take 1 capsule by mouth 2 (two) times daily.    LISINOPRIL-HYDROCHLOROTHIAZIDE (ZESTORETIC) 10-12.5 MG TABLET    TAKE 1 TABLET EVERY DAY   LOPERAMIDE (IMODIUM A-D) 2 MG TABLET    Take 1 tablet (2 mg total) by mouth 3 (three) times daily as needed for diarrhea or loose stools (for diarrhea).   METOPROLOL SUCCINATE (TOPROL-XL) 50 MG 24 HR TABLET    Take 1 tablet (50 mg total) by mouth daily. Take with or immediately following a meal.   PROBIOTIC PRODUCT (PROBIOTIC DAILY PO)    Take 1 tablet by mouth in the morning and at bedtime.    ROSUVASTATIN (CRESTOR) 5 MG TABLET    1 tablet by mouth three times weekly   TURMERIC CURCUMIN 500 MG CAPS    Take 1 capsule by mouth 2 (two) times daily.   Modified Medications   No medications on file  Discontinued Medications   No medications on file    Physical Exam:  Vitals:   06/01/20 1136  BP: 130/80  Pulse: 61  Temp: 97.7 F (36.5 C)  TempSrc: Temporal  SpO2: 97%  Weight: 201 lb 6.4 oz (91.4 kg)  Height: 5\' 4"  (1.626 m)   Body mass index is 34.57 kg/m. Wt Readings from Last 3 Encounters:  06/01/20 201 lb 6.4 oz (91.4 kg)  01/30/20 200 lb (90.7 kg)  12/26/19 208 lb 3.2 oz (94.4 kg)    Physical Exam Constitutional:      General: She is not in acute distress.    Appearance: Normal appearance. She is not diaphoretic.  HENT:     Head: Normocephalic and atraumatic.     Mouth/Throat:     Pharynx: No oropharyngeal exudate.  Eyes:     Conjunctiva/sclera: Conjunctivae normal.     Pupils: Pupils are equal, round, and reactive to light.  Cardiovascular:     Rate and Rhythm: Normal rate and regular rhythm.     Heart sounds: Normal heart sounds.  Pulmonary:     Effort: Pulmonary effort is normal.     Breath sounds: Normal breath sounds.   Abdominal:     General: Bowel sounds are normal.     Palpations: Abdomen is soft.  Musculoskeletal:        General: No swelling or tenderness. Normal range of motion.  Skin:    General: Skin is warm and dry.  Neurological:     Mental Status: She is alert and oriented to person, place, and time.     Labs reviewed: Basic Metabolic Panel: Recent Labs    07/20/19 1353 09/26/19 0920 01/11/20 0811  NA 139  140 140  K 4.3 4.3 4.1  CL 103 104 106  CO2 26 25 24   GLUCOSE 107 104* 105*  BUN 17 19 16   CREATININE 0.75 0.72 0.76  CALCIUM 9.8 9.6 9.1  TSH 0.90  --   --    Liver Function Tests: Recent Labs    07/20/19 1353 09/26/19 0920  AST 19 14  ALT 14 13  BILITOT 0.7 0.6  PROT 6.5 6.3   No results for input(s): LIPASE, AMYLASE in the last 8760 hours. No results for input(s): AMMONIA in the last 8760 hours. CBC: Recent Labs    07/14/19 0000 01/11/20 0811  WBC 10.3 9.0  NEUTROABS  --  4,392  HGB 15.2 14.2  HCT 48* 44.4  MCV  --  91.4  PLT 304 303   Lipid Panel: Recent Labs    09/26/19 0920 01/11/20 0811  CHOL 157 122  HDL 62 35*  LDLCALC 73 58  TRIG 140 229*  CHOLHDL 2.5 3.5   TSH: Recent Labs    07/20/19 1353  TSH 0.90   A1C: Lab Results  Component Value Date   HGBA1C 6.4 (H) 01/11/2020     Assessment/Plan 1. Mixed hyperlipidemia Triglycerides are still elevated. Encouraged to work on diet modifications, going to give up coke for lent. Continue on crestor, Will recheck labs today. Appears to have a poor diet based on food recall during OV. Education provided.  - rosuvastatin (CRESTOR) 5 MG tablet; 1 tablet by mouth three times weekly  Dispense: 40 tablet; Refill: 1 - Lipid Panel - COMPLETE METABOLIC PANEL WITH GFR  2. Anxiety and depression Continue on cymbalta, overall mood has been stable. Encouraged to get up and get moving when she can, as well as decreasing her sugar intake. Gave pt information for an app called three good things. This  gratitude journal should help focus on more positive things throughout the day and improve her mood. - DULoxetine (CYMBALTA) 60 MG capsule; Take 1 capsule (60 mg total) by mouth daily.  Dispense: 90 capsule; Refill: 1  3. Essential hypertension BP today is130/80, continue on current regimen. Encouraged a diet low in sodium - metoprolol succinate (TOPROL-XL) 50 MG 24 hr tablet; Take 1 tablet (50 mg total) by mouth daily. Take with or immediately following a meal.  Dispense: 90 tablet; Refill: 1  4. Irritable bowel syndrome with diarrhea Followed by gastroenterology, ongoing. Educated on taking bentyl for symptom management. Continues taking probiotic and imodium as needed.  5. Primary osteoarthritis involving multiple joints Encouraged strength training to build up her muscles and improve her symptoms. Weight loss encouraged.   6. Hyperglycemia Encouraged diet modifications - Hemoglobin A1c  7. Class 1 obesity due to excess calories with serious comorbidity and body mass index (BMI) of 34.0 to 34.9 in adult Encouraged weight loss through proper diet and exercise.    Next appt: 4 months.  07/22/19. 03/12/2020  [facility] [phone]   I personally was present during the history, physical exam and medical decision-making activities of this service and have verified that the service and findings are accurately documented in the student's note

## 2020-06-02 LAB — COMPLETE METABOLIC PANEL WITH GFR
AG Ratio: 1.6 (calc) (ref 1.0–2.5)
ALT: 14 U/L (ref 6–29)
AST: 18 U/L (ref 10–35)
Albumin: 4.2 g/dL (ref 3.6–5.1)
Alkaline phosphatase (APISO): 157 U/L — ABNORMAL HIGH (ref 37–153)
BUN: 18 mg/dL (ref 7–25)
CO2: 29 mmol/L (ref 20–32)
Calcium: 10 mg/dL (ref 8.6–10.4)
Chloride: 104 mmol/L (ref 98–110)
Creat: 0.67 mg/dL (ref 0.60–0.88)
GFR, Est African American: 96 mL/min/{1.73_m2} (ref >=60)
GFR, Est Non African American: 83 mL/min/{1.73_m2} (ref >=60)
Globulin: 2.6 g/dL (calc) (ref 1.9–3.7)
Glucose, Bld: 102 mg/dL — ABNORMAL HIGH (ref 65–99)
Potassium: 5.1 mmol/L (ref 3.5–5.3)
Sodium: 142 mmol/L (ref 135–146)
Total Bilirubin: 0.6 mg/dL (ref 0.2–1.2)
Total Protein: 6.8 g/dL (ref 6.1–8.1)

## 2020-06-02 LAB — LIPID PANEL
Cholesterol: 188 mg/dL (ref <200)
HDL: 57 mg/dL (ref >=50)
LDL Cholesterol (Calc): 97 mg/dL (calc)
Non-HDL Cholesterol (Calc): 131 mg/dL (calc) — ABNORMAL HIGH (ref <130)
Total CHOL/HDL Ratio: 3.3 (calc) (ref <5.0)
Triglycerides: 216 mg/dL — ABNORMAL HIGH (ref <150)

## 2020-06-02 LAB — HEMOGLOBIN A1C
Hgb A1c MFr Bld: 6.2 % of total Hgb — ABNORMAL HIGH (ref <5.7)
Mean Plasma Glucose: 131 mg/dL
eAG (mmol/L): 7.3 mmol/L

## 2020-07-16 ENCOUNTER — Other Ambulatory Visit: Payer: Self-pay | Admitting: Nurse Practitioner

## 2020-07-16 DIAGNOSIS — R911 Solitary pulmonary nodule: Secondary | ICD-10-CM

## 2020-07-16 NOTE — Progress Notes (Signed)
Spoke with patient and she is aware of placement for CT order. Patient aware to expect call.

## 2020-07-16 NOTE — Progress Notes (Signed)
Pt was a part of study at [facility], had CT of lung that noted LLL nodule and follow up recommended in 1 year (1 year follow up CT for enlarging LLL nodule and numerous tiny bilateral pulmonary nodules)  CT order placed, please notify pt they will get a call to schedule.

## 2020-07-31 ENCOUNTER — Inpatient Hospital Stay: Admission: RE | Admit: 2020-07-31 | Payer: Medicare HMO | Source: Ambulatory Visit

## 2020-08-01 ENCOUNTER — Telehealth: Payer: Self-pay | Admitting: Nurse Practitioner

## 2020-08-01 NOTE — Telephone Encounter (Signed)
Reviewed chart and most recent CT scan shows stable nodules. No need to follow up CT at this time.

## 2020-08-08 ENCOUNTER — Other Ambulatory Visit: Payer: Self-pay | Admitting: Nurse Practitioner

## 2020-08-08 DIAGNOSIS — I10 Essential (primary) hypertension: Secondary | ICD-10-CM

## 2020-08-09 ENCOUNTER — Encounter: Payer: Self-pay | Admitting: Nurse Practitioner

## 2020-08-09 ENCOUNTER — Ambulatory Visit (INDEPENDENT_AMBULATORY_CARE_PROVIDER_SITE_OTHER): Payer: Medicare HMO | Admitting: Nurse Practitioner

## 2020-08-09 ENCOUNTER — Other Ambulatory Visit: Payer: Self-pay

## 2020-08-09 DIAGNOSIS — I10 Essential (primary) hypertension: Secondary | ICD-10-CM | POA: Diagnosis not present

## 2020-08-09 DIAGNOSIS — Z0000 Encounter for general adult medical examination without abnormal findings: Secondary | ICD-10-CM

## 2020-08-09 MED ORDER — DICYCLOMINE HCL 20 MG PO TABS: 20.0000 mg | ORAL_TABLET | Freq: Two times a day (BID) | ORAL | 1 refills | Status: DC

## 2020-08-09 MED ORDER — LISINOPRIL-HYDROCHLOROTHIAZIDE 10-12.5 MG PO TABS: 1.0000 | ORAL_TABLET | Freq: Every day | ORAL | 1 refills | Status: DC

## 2020-08-09 NOTE — Progress Notes (Signed)
Subjective:   April Hill is a 80 y.o. female who presents for Medicare Annual (Subsequent) preventive examination.  Review of Systems     Cardiac Risk Factors include: obesity (BMI >30kg/m2);sedentary lifestyle;hypertension;dyslipidemia;advanced age (>33men, >14 women)     Objective:    There were no vitals filed for this visit. There is no height or weight on file to calculate BMI.  Advanced Directives 08/09/2020 06/01/2020 01/30/2020 12/26/2019 09/28/2019 08/08/2019 07/20/2019  Does Patient Have a Medical Advance Directive? Yes Yes No;Yes Yes Yes Yes Yes  Type of Advance Directive Out of facility DNR (pink MOST or yellow form) Out of facility DNR (pink MOST or yellow form) Out of facility DNR (pink MOST or yellow form) Out of facility DNR (pink MOST or yellow form) Healthcare Power of [place];Out of facility DNR (pink MOST or yellow form) Healthcare Power of [place];Living will Healthcare Power of [place];Living will  Does patient want to make changes to medical advance directive? No - Patient declined No - Patient declined No - Patient declined No - Patient declined No - Patient declined No - Patient declined -  Copy of Healthcare Power of Attorney in Chart? - - - - Yes - validated most recent copy scanned in chart (See row information) No - copy requested No - copy requested  Pre-existing out of facility DNR order (yellow form or pink MOST form) Yellow form placed in chart (order not valid for inpatient use);Pink MOST form placed in chart (order not valid for inpatient use) Yellow form placed in chart (order not valid for inpatient use);Pink MOST form placed in chart (order not valid for inpatient use) Yellow form placed in chart (order not valid for inpatient use);Pink MOST form placed in chart (order not valid for inpatient use) Yellow form placed in chart (order not valid for inpatient use);Pink MOST form placed in chart (order not valid for inpatient use) - - -    Current Medications  (verified) Outpatient Encounter Medications as of 08/09/2020  Medication Sig  . acetaminophen (TYLENOL) 500 MG tablet Take 500 mg by mouth 2 (two) times daily.  Marland Kitchen aspirin EC 81 MG tablet Take 81 mg by mouth daily.  . Cholecalciferol (VITAMIN D3) 2000 units TABS Take 1 tablet by mouth daily.  Marland Kitchen dicyclomine (BENTYL) 20 MG tablet Take 20 mg by mouth 2 (two) times daily.  . DULoxetine (CYMBALTA) 60 MG capsule Take 1 capsule (60 mg total) by mouth daily.  Marland Kitchen esomeprazole (NEXIUM) 40 MG capsule Take 1 capsule (40 mg total) by mouth daily at 12 noon.  Marland Kitchen glucosamine-chondroitin 500-400 MG tablet Take 1 tablet by mouth 2 (two) times daily.   [name] Oil (OMEGA-3) 500 MG CAPS Take 1 capsule by mouth 2 (two) times daily.   Marland Kitchen lisinopril-hydrochlorothiazide (ZESTORETIC) 10-12.5 MG tablet TAKE 1 TABLET EVERY DAY  . loperamide (IMODIUM A-D) 2 MG tablet Take 1 tablet (2 mg total) by mouth 3 (three) times daily as needed for diarrhea or loose stools (for diarrhea).  . metoprolol succinate (TOPROL-XL) 50 MG 24 hr tablet Take 1 tablet (50 mg total) by mouth daily. Take with or immediately following a meal.  . Probiotic Product (PROBIOTIC DAILY PO) Take 1 tablet by mouth in the morning and at bedtime.   . rosuvastatin (CRESTOR) 5 MG tablet 1 tablet by mouth three times weekly  . Turmeric Curcumin 500 MG CAPS Take 1 capsule by mouth 2 (two) times daily.    No facility-administered encounter medications on file as of 08/09/2020.  Allergies (verified) Amoxicillin   History: Past Medical History:  Diagnosis Date  . Anxiety and depression   . Cataract   . 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 Fall   . GERD (gastroesophageal reflux disease)   . Hyperlipemia   . Hypertension   . Osteoarthritis   . Thyroid disease    Past Surgical History:  Procedure Laterality Date  . ABDOMINAL HYSTERECTOMY  1978  . BREAST LUMPECTOMY  1980  . SALIVARY STONE REMOVAL  1980's  . TONSILLECTOMY  1950's    Family History  Problem Relation Age of Onset  . Hypertension Mother   . Heart disease Mother   . Heart attack Father 58  . Heart disease Father   . Heart attack Sister   . Arthritis Sister   . Diabetes Brother    Social History   Socioeconomic History  . Marital status: Married    Spouse name: Not on file  . Number of children: Not on file  . Years of education: Not on file  . Highest education level: Not on file  Occupational History  . Not on file  Tobacco Use  . Smoking status: Never Smoker  . Smokeless tobacco: Never Used  Vaping Use  . Vaping Use: Never used  Substance and Sexual Activity  . Alcohol use: No  . Drug use: No  . Sexual activity: Not Currently  Other Topics Concern  . Not on file  Social History Narrative   Social History      Diet? Off and on      Do you drink/eat things with caffeine? yes      Marital status?  married                                  What year were you married? 1960      Do you live in a house, apartment, assisted living, condo, trailer, etc.? House with daughter      Is it one or more stories? 2-we live in basement apt      How many persons live in your home? 4      Do you have any pets in your home? (please list) cat      Highest level of education completed? High school      Current or past profession: Diplomatic Services operational officer- cashier-hostess      Do you exercise?         no                             Type & how often? --      Advanced Directives      Do you have a living will? yes      Do you have a DNR form?       yes                            If not, do you want to discuss one?      Do you have signed POA/HPOA for forms? yes      Functional Status      Do you have difficulty bathing or dressing yourself? no      Do you have difficulty preparing food or eating? no      Do you have difficulty managing your  medications? no      Do you have difficulty managing your finances? no      Do you have difficulty  affording your medications? no   Social Determinants of Health   Financial Resource Strain: Not on file  Food Insecurity: Not on file  Transportation Needs: Not on file  Physical Activity: Not on file  Stress: Not on file  Social Connections: Not on file    Tobacco Counseling Counseling given: Not Answered   Clinical Intake:  Pre-visit preparation completed: Yes  Pain : No/denies pain     BMI - recorded: 30 Nutritional Status: BMI > 30  Obese Nutritional Risks: Nausea/ vomitting/ diarrhea Diabetes: No  How often do you need to have someone help you when you read instructions, pamphlets, or other written materials from your doctor or pharmacy?: 3 - Sometimes  Diabetic?no         Activities of Daily Living In your present state of health, do you have any difficulty performing the following activities: 08/09/2020  Hearing? Y  Vision? N  Difficulty concentrating or making decisions? N  Walking or climbing stairs? Y  Comment due to knee pain  Dressing or bathing? N  Doing errands, shopping? N  Preparing Food and eating ? N  Using the Toilet? N  In the past six months, have you accidently leaked urine? Y  Do you have problems with loss of bowel control? Y  Managing your Medications? N  Managing your Finances? N  Housekeeping or managing your Housekeeping? N  Some recent data might be hidden    Patient Care Team: [name], NP as PCP - General (Geriatric Medicine) Jethro Bolus, MD as Consulting Physician (Ophthalmology)  Indicate any recent Medical Services you may have received from other than [facility] providers in the past year (date may be approximate).     Assessment:   This is a routine wellness examination for April Hill.  Hearing/Vision screen  Hearing Screening             Right ear:           Left ear:           Comments: Hearing loss, had ears tested. Hearing aids was suggested, patient has not  done anything about it.   Vision Screening Comments: Last eye exam less than 12 months ago, patient has new glasses.   Dietary issues and exercise activities discussed: Current Exercise Habits: The patient does not participate in regular exercise at present, Exercise limited by: orthopedic condition(s)  Goals Addressed   None    Depression Screen PHQ 2/9 Scores 08/09/2020 09/28/2019 08/08/2019 08/08/2019 03/18/2019 08/05/2018 05/10/2018  PHQ - 2 Score 0 0 0 0 0 0 0  PHQ- 9 Score - - - - - - -    Fall Risk Fall Risk  08/09/2020 06/01/2020 01/30/2020 01/09/2020 09/28/2019  Falls in the past year? 0 1 0 0 0  Number falls in past yr: 0 1 0 0 0  Injury with Fall? 0 0 0 0 0    FALL RISK PREVENTION PERTAINING TO THE HOME:  Any stairs in or around the home? Yes  If so, are there any without handrails? No  Home free of loose throw rugs in walkways, pet beds, electrical cords, etc? Yes  Adequate lighting in your home to reduce risk of falls? Yes   ASSISTIVE DEVICES UTILIZED TO PREVENT FALLS:  Life alert? No  Use of a cane, walker or w/c? No  Grab bars  in the bathroom? Yes  Shower chair or bench in shower? No  Elevated toilet seat or a handicapped toilet? Yes   TIMED UP AND GO:  Was the test performed? No .   Cognitive Function: MMSE - Mini Mental State Exam 08/08/2019 05/22/2017  Orientation to time 5 5  Orientation to Place 5 5  Registration 3 3  Attention/ Calculation 5 5  Recall 2 3  Language- name 2 objects 2 2  Language- repeat 1 1  Language- follow 3 step command 3 3  Language- read & follow direction 1 1  Write a sentence 1 1  Copy design 1 0  Total score 29 29     6CIT Screen 08/09/2020 08/05/2018  What Year? 0 points 0 points  What month? 0 points 0 points  What time? 0 points 0 points  Count back from 20 0 points 0 points  Months in reverse 0 points 0 points  Repeat phrase 0 points 0 points  Total Score 0 0    Immunizations Immunization History  Administered Date(s)  Administered  . Fluad Quad(high Dose 65+) 01/30/2020  . Influenza, High Dose Seasonal PF 01/20/2017, 12/08/2018  . Influenza, Seasonal, Injecte, Preservative Fre 04/07/2010  . Influenza-Unspecified 02/06/2016, 01/01/2018  . Pneumococcal Conjugate-13 05/08/2014  . Pneumococcal Polysaccharide-23 04/07/2009  . Tdap 04/07/2012  . Zoster 05/08/2014  . Zoster Recombinat (Shingrix) 12/08/2018, 02/07/2019    TDAP status: Up to date  Flu Vaccine status: Up to date  Pneumococcal vaccine status: Up to date  Covid-19 vaccine status: Declined, Education has been provided regarding the importance of this vaccine but patient still declined. Advised may receive this vaccine at local pharmacy or Health Dept.or vaccine clinic. Aware to provide a copy of the vaccination record if obtained from local pharmacy or Health Dept. Verbalized acceptance and understanding.  Qualifies for Shingles Vaccine? Yes   Zostavax completed Yes   Shingrix Completed?: Yes  Screening Tests Health Maintenance  Topic Date Due  . DEXA SCAN  06/01/2021 (Originally 09/16/2004)  . INFLUENZA VACCINE  11/05/2020  . TETANUS/TDAP  04/07/2022  . PNA vac Low Risk Adult  Completed  . HPV VACCINES  Aged Out  . COVID-19 Vaccine  Discontinued    Health Maintenance  There are no preventive care reminders to display for this patient.  Colorectal cancer screening: No longer required.   Mammogram status: No longer required due to age.  Pt declines bone density  Lung Cancer Screening: (Low Dose CT Chest recommended if Age 73-80 years, 30 pack-year currently smoking OR have quit w/in 15years.) does not qualify.   Lung Cancer Screening Referral: na  Additional Screening:  Hepatitis C Screening: does not qualify; Completed na  Vision Screening: Recommended annual ophthalmology exams for early detection of glaucoma and other disorders of the eye. Is the patient up to date with their annual eye exam?  Yes  Who is the provider or  what is the name of the office in which the patient attends annual eye exams? Dr Sampson SiShipro If pt is not established with a provider, would they like to be referred to a provider to establish care? No .   Dental Screening: Recommended annual dental exams for proper oral hygiene  Community Resource Referral / Chronic Care Management: CRR required this visit?  No   CCM required this visit?  No      Plan:     I have personally reviewed and noted the following in the patient's chart:   . Medical and  social history . Use of alcohol, tobacco or illicit drugs  . Current medications and supplements including opioid prescriptions.  . Functional ability and status . Nutritional status . Physical activity . Advanced directives . List of other physicians . Hospitalizations, surgeries, and ER visits in previous 12 months . Vitals . Screenings to include cognitive, depression, and falls . Referrals and appointments  In addition, I have reviewed and discussed with patient certain preventive protocols, quality metrics, and best practice recommendations. A written personalized care plan for preventive services as well as general preventive health recommendations were provided to patient.     [name], NP   08/09/2020    Virtual Visit via Telephone Note  I connected with@ on 08/09/20 at 10:00 AM EDT by telephone and verified that I am speaking with the correct person using two identifiers.  Location: Patient: home Provider: [facility]   I discussed the limitations, risks, security and privacy concerns of performing an evaluation and management service by telephone and the availability of in person appointments. I also discussed with the patient that there may be a patient responsible charge related to this service. The patient expressed understanding and agreed to proceed.   I discussed the assessment and treatment plan with the patient. The patient was provided an opportunity to ask  questions and all were answered. The patient agreed with the plan and demonstrated an understanding of the instructions.   The patient was advised to call back or seek an in-person evaluation if the symptoms worsen or if the condition fails to improve as anticipated.  I provided 18 minutes of non-face-to-face time during this encounter.  Janene Harvey. [employer] printed and mailed

## 2020-08-09 NOTE — Progress Notes (Signed)
   This service is provided via telemedicine  No vital signs collected/recorded due to the encounter was a telemedicine visit.   Location of patient (ex: home, work):  Home  Patient consents to a telephone visit: Yes, see telephone visit dated 09/28/2019  Location of the provider (ex: office, home):  [facility], Office   Name of any referring provider:  N/A  Names of all persons participating in the telemedicine service and their role in the encounter:  S.Chrae B/CMA, [name], NP, French Ana (daughter), and Patient   Time spent on call:  8 min with medical assistant

## 2020-08-09 NOTE — Patient Instructions (Signed)
April [place] , Thank you for taking time to come for your Medicare Wellness Visit. I appreciate your ongoing commitment to your health goals. Please review the following plan we discussed and let me know if I can assist you in the future.   Screening recommendations/referrals: Colonoscopy aged out Mammogram aged out Bone Density declined.  Recommended yearly ophthalmology/optometry visit for glaucoma screening and checkup Recommended yearly dental visit for hygiene and checkup  Vaccinations: Influenza vaccine up to date Pneumococcal vaccine up to date Tdap vaccine up to date Shingles vaccine  Up to date   COVID- recommended  Advanced directives: on file.   Conditions/risks identified: advanced age, decrease mobility, pain. Fall risk.  Next appointment: 1 year for AWV   Preventive Care 81 Years and Older, Female Preventive care refers to lifestyle choices and visits with your health care provider that can promote health and wellness. What does preventive care include?  A yearly physical exam. This is also called an annual well check.  Dental exams once or twice a year.  Routine eye exams. Ask your health care provider how often you should have your eyes checked.  Personal lifestyle choices, including:  Daily care of your teeth and gums.  Regular physical activity.  Eating a healthy diet.  Avoiding tobacco and drug use.  Limiting alcohol use.  Practicing safe sex.  Taking low-dose aspirin every day.  Taking vitamin and mineral supplements as recommended by your health care provider. What happens during an annual well check? The services and screenings done by your health care provider during your annual well check will depend on your age, overall health, lifestyle risk factors, and family history of disease. Counseling  Your health care provider may ask you questions about your:  Alcohol use.  Tobacco use.  Drug use.  Emotional well-being.  Home and  relationship well-being.  Sexual activity.  Eating habits.  History of falls.  Memory and ability to understand (cognition).  Work and work Astronomer.  Reproductive health. Screening  You may have the following tests or measurements:  Height, weight, and BMI.  Blood pressure.  Lipid and cholesterol levels. These may be checked every 5 years, or more frequently if you are over 14 years old.  Skin check.  Lung cancer screening. You may have this screening every year starting at age 37 if you have a 30-pack-year history of smoking and currently smoke or have quit within the past 15 years.  Fecal occult blood test (FOBT) of the stool. You may have this test every year starting at age 1.  Flexible sigmoidoscopy or colonoscopy. You may have a sigmoidoscopy every 5 years or a colonoscopy every 10 years starting at age 44.  Hepatitis C blood test.  Hepatitis B blood test.  Sexually transmitted disease (STD) testing.  Diabetes screening. This is done by checking your blood sugar (glucose) after you have not eaten for a while (fasting). You may have this done every 1-3 years.  Bone density scan. This is done to screen for osteoporosis. You may have this done starting at age 52.  Mammogram. This may be done every 1-2 years. Talk to your health care provider about how often you should have regular mammograms. Talk with your health care provider about your test results, treatment options, and if necessary, the need for more tests. Vaccines  Your health care provider may recommend certain vaccines, such as:  Influenza vaccine. This is recommended every year.  Tetanus, diphtheria, and acellular pertussis (Tdap, Td) vaccine. You  may need a Td booster every 10 years.  Zoster vaccine. You may need this after age 50.  Pneumococcal 13-valent conjugate (PCV13) vaccine. One dose is recommended after age 56.  Pneumococcal polysaccharide (PPSV23) vaccine. One dose is recommended after  age 33. Talk to your health care provider about which screenings and vaccines you need and how often you need them. This information is not intended to replace advice given to you by your health care provider. Make sure you discuss any questions you have with your health care provider. Document Released: 04/20/2015 Document Revised: 12/12/2015 Document Reviewed: 01/23/2015 Elsevier Interactive Patient Education  2017 [place] Prevention in the Home Falls can cause injuries. They can happen to people of all ages. There are many things you can do to make your home safe and to help prevent falls. What can I do on the outside of my home?  Regularly fix the edges of walkways and driveways and fix any cracks.  Remove anything that might make you trip as you walk through a door, such as a raised step or threshold.  Trim any bushes or trees on the path to your home.  Use bright outdoor lighting.  Clear any walking paths of anything that might make someone trip, such as rocks or tools.  Regularly check to see if handrails are loose or broken. Make sure that both sides of any steps have handrails.  Any raised decks and porches should have guardrails on the edges.  Have any leaves, snow, or ice cleared regularly.  Use sand or salt on walking paths during winter.  Clean up any spills in your garage right away. This includes oil or grease spills. What can I do in the bathroom?  Use night lights.  Install grab bars by the toilet and in the tub and shower. Do not use towel bars as grab bars.  Use non-skid mats or decals in the tub or shower.  If you need to sit down in the shower, use a plastic, non-slip stool.  Keep the floor dry. Clean up any water that spills on the floor as soon as it happens.  Remove soap buildup in the tub or shower regularly.  Attach bath mats securely with double-sided non-slip rug tape.  Do not have throw rugs and other things on the floor that can  make you trip. What can I do in the bedroom?  Use night lights.  Make sure that you have a light by your bed that is easy to reach.  Do not use any sheets or blankets that are too big for your bed. They should not hang down onto the floor.  Have a firm chair that has side arms. You can use this for support while you get dressed.  Do not have throw rugs and other things on the floor that can make you trip. What can I do in the kitchen?  Clean up any spills right away.  Avoid walking on wet floors.  Keep items that you use a lot in easy-to-reach places.  If you need to reach something above you, use a strong step stool that has a grab bar.  Keep electrical cords out of the way.  Do not use floor polish or wax that makes floors slippery. If you must use wax, use non-skid floor wax.  Do not have throw rugs and other things on the floor that can make you trip. What can I do with my stairs?  Do not leave any items  on the stairs.  Make sure that there are handrails on both sides of the stairs and use them. Fix handrails that are broken or loose. Make sure that handrails are as long as the stairways.  Check any carpeting to make sure that it is firmly attached to the stairs. Fix any carpet that is loose or worn.  Avoid having throw rugs at the top or bottom of the stairs. If you do have throw rugs, attach them to the floor with carpet tape.  Make sure that you have a light switch at the top of the stairs and the bottom of the stairs. If you do not have them, ask someone to add them for you. What else can I do to help prevent falls?  Wear shoes that:  Do not have high heels.  Have rubber bottoms.  Are comfortable and fit you well.  Are closed at the toe. Do not wear sandals.  If you use a stepladder:  Make sure that it is fully opened. Do not climb a closed stepladder.  Make sure that both sides of the stepladder are locked into place.  Ask someone to hold it for you,  if possible.  Clearly mark and make sure that you can see:  Any grab bars or handrails.  First and last steps.  Where the edge of each step is.  Use tools that help you move around (mobility aids) if they are needed. These include:  Canes.  Walkers.  Scooters.  Crutches.  Turn on the lights when you go into a dark area. Replace any light bulbs as soon as they burn out.  Set up your furniture so you have a clear path. Avoid moving your furniture around.  If any of your floors are uneven, fix them.  If there are any pets around you, be aware of where they are.  Review your medicines with your doctor. Some medicines can make you feel dizzy. This can increase your chance of falling. Ask your doctor what other things that you can do to help prevent falls. This information is not intended to replace advice given to you by your health care provider. Make sure you discuss any questions you have with your health care provider. Document Released: 01/18/2009 Document Revised: 08/30/2015 Document Reviewed: 04/28/2014 Elsevier Interactive Patient Education  2017 [employer].

## 2020-09-11 ENCOUNTER — Encounter: Payer: Self-pay | Admitting: Family Medicine

## 2020-09-11 ENCOUNTER — Other Ambulatory Visit: Payer: Self-pay

## 2020-09-11 ENCOUNTER — Ambulatory Visit: Payer: Medicare HMO | Admitting: Family Medicine

## 2020-09-11 DIAGNOSIS — M1712 Unilateral primary osteoarthritis, left knee: Secondary | ICD-10-CM | POA: Diagnosis not present

## 2020-09-11 DIAGNOSIS — M1711 Unilateral primary osteoarthritis, right knee: Secondary | ICD-10-CM | POA: Diagnosis not present

## 2020-09-11 DIAGNOSIS — M25511 Pain in right shoulder: Secondary | ICD-10-CM | POA: Diagnosis not present

## 2020-09-11 DIAGNOSIS — M170 Bilateral primary osteoarthritis of knee: Secondary | ICD-10-CM | POA: Diagnosis not present

## 2020-09-11 DIAGNOSIS — G8929 Other chronic pain: Secondary | ICD-10-CM

## 2020-09-11 NOTE — Progress Notes (Signed)
Office Visit Note   Patient: April Hill           Date of Birth: [birth year]           MRN: [id number] Visit Date: 09/11/2020 Requested by: [name], NP [address] [place],  [place] [address] PCP: [name], NP  Subjective: Chief Complaint  Patient presents with  . Left Shoulder - Pain  . Right Knee - Pain    Requests cortisone injections in both knees and the left shoulder.   . Left Knee - Pain    HPI: She is here with left shoulder and bilateral knee pain.  History of impingement and osteoarthritis in the shoulder, and osteoarthritis in both knees.  Last injections for the knees were in June 2021 and for the left shoulder July 2020.  She has done pretty well since then, not pain-free but the pain was tolerable.  She is requesting more injections.  Getting ready to go to [place] later this month to visit family.              ROS:   All other systems were reviewed and are negative.  Objective: Vital Signs: There were no vitals taken for this visit.  Physical Exam:  General:  Alert and oriented, in no acute distress. Pulm:  Breathing unlabored. Psy:  Normal mood, congruent affect  Left shoulder: Pain and slight decreased range of motion with overhead reach.  Rotator cuff strength is 5/5 overall.  Tender in the posterior subacromial space. Knees: Trace effusion bilaterally with no warmth or erythema.  Both knees are tender on the medial joint line.  Imaging: No results found.  Assessment & Plan: 1.  Chronic left shoulder pain with impingement and osteoarthritis - Subacromial injection today.  Follow-up as needed.  2.  Bilateral knee osteoarthritis - Bilateral steroid injections today.     Procedures: Left shoulder subacromial injection: After sterile prep with Betadine, injected 3 cc 1% lidocaine without epinephrine and 6 mg betamethasone from posterior approach.  Bilateral knee injections: After sterile prep with Betadine, injected 3 cc 1%  lidocaine without epinephrine and 6 mg betamethasone from superolateral approach left knee and superomedial approach right knee.       PMFS History: Patient Active Problem List   Diagnosis Date Noted  . Elevated alkaline phosphatase level 09/22/2017  . Hypertension   . Hyperlipemia   . Anxiety and depression   . Osteoarthritis   . Irritable bowel syndrome with diarrhea 11/05/2016  . History of colon polyps 11/05/2016  . Abnormal nuclear stress test 08/05/2013  . Vitamin D deficiency 03/11/2011  . Obesity 03/11/2011  . Gastro-esophageal reflux disease without esophagitis 03/11/2011  . Abnormal glucose 03/11/2011   Past Medical History:  Diagnosis Date  . Anxiety and depression   . Cataract   . 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 Fall   . GERD (gastroesophageal reflux disease)   . Hyperlipemia   . Hypertension   . Osteoarthritis   . Thyroid disease     Family History  Problem Relation Age of Onset  . Hypertension Mother   . Heart disease Mother   . Heart attack Father 75  . Heart disease Father   . Heart attack Sister   . Arthritis Sister   . Diabetes Brother     Past Surgical History:  Procedure Laterality Date  . ABDOMINAL HYSTERECTOMY  1978  . BREAST LUMPECTOMY  1980  . SALIVARY  STONE REMOVAL  1980's  . TONSILLECTOMY  1950's   Social History   Occupational History  . Not on file  Tobacco Use  . Smoking status: Never Smoker  . Smokeless tobacco: Never Used  Vaping Use  . Vaping Use: Never used  Substance and Sexual Activity  . Alcohol use: No  . Drug use: No  . Sexual activity: Not Currently

## 2020-09-28 ENCOUNTER — Ambulatory Visit: Payer: Medicare HMO | Admitting: Nurse Practitioner

## 2020-10-24 ENCOUNTER — Other Ambulatory Visit: Payer: Self-pay | Admitting: Nurse Practitioner

## 2020-10-24 DIAGNOSIS — F32A Depression, unspecified: Secondary | ICD-10-CM

## 2020-10-24 DIAGNOSIS — I10 Essential (primary) hypertension: Secondary | ICD-10-CM

## 2020-11-21 ENCOUNTER — Encounter: Payer: Self-pay | Admitting: Nurse Practitioner

## 2020-11-21 ENCOUNTER — Ambulatory Visit (INDEPENDENT_AMBULATORY_CARE_PROVIDER_SITE_OTHER): Payer: Medicare HMO | Admitting: Nurse Practitioner

## 2020-11-21 ENCOUNTER — Other Ambulatory Visit: Payer: Self-pay

## 2020-11-21 VITALS — BP 112/76 | HR 71 | Temp 97.1°F | Ht 64.0 in | Wt 205.0 lb

## 2020-11-21 DIAGNOSIS — R5382 Chronic fatigue, unspecified: Secondary | ICD-10-CM

## 2020-11-21 DIAGNOSIS — F32A Depression, unspecified: Secondary | ICD-10-CM

## 2020-11-21 DIAGNOSIS — F419 Anxiety disorder, unspecified: Secondary | ICD-10-CM | POA: Diagnosis not present

## 2020-11-21 DIAGNOSIS — E782 Mixed hyperlipidemia: Secondary | ICD-10-CM

## 2020-11-21 DIAGNOSIS — M8949 Other hypertrophic osteoarthropathy, multiple sites: Secondary | ICD-10-CM

## 2020-11-21 DIAGNOSIS — M159 Polyosteoarthritis, unspecified: Secondary | ICD-10-CM

## 2020-11-21 DIAGNOSIS — K580 Irritable bowel syndrome with diarrhea: Secondary | ICD-10-CM

## 2020-11-21 DIAGNOSIS — I10 Essential (primary) hypertension: Secondary | ICD-10-CM | POA: Diagnosis not present

## 2020-11-21 DIAGNOSIS — R739 Hyperglycemia, unspecified: Secondary | ICD-10-CM

## 2020-11-21 NOTE — Progress Notes (Signed)
Careteam: Patient Care Team: Lauree Chandler, NP as PCP - General (Geriatric Medicine) Rutherford Guys, MD as Consulting Physician (Ophthalmology)  PLACE OF SERVICE:  [place] Directive information Does Patient Have a Medical Advance Directive?: Yes, Type of Advance Directive: Out of facility DNR (pink MOST or yellow form), Pre-existing out of facility DNR order (yellow form or pink MOST form): Yellow form placed in chart (order not valid for inpatient use);Pink MOST form placed in chart (order not valid for inpatient use), Does patient want to make changes to medical advance directive?: No - Patient declined  Allergies  Allergen Reactions   Amoxicillin Diarrhea    Chief Complaint  Patient presents with   Medical Management of Chronic Issues    4 month follow-up. Flu vaccine not in stock      HPI: Patient is a 80 y.o. female for routine follow up.  Since last visit has been doing well.   IBS with diarrhea- was doing well for a while but the last few days has been having issues. She uses imodium which is helpful.   Anxiety/depression- stressed bc cat has fleas. Continues on cymbalta 60 mg daily. Gets anxious easily. Has not talked to anyone. Has recommended therapist but daughter reports she wont go. Worried about grandchilden and everyday life.   Reports she is very sleeping during the day but she sleeps well at night.   OA to knees- injections went well but now wearing out.    Review of Systems:  Review of Systems  Constitutional:  Negative for chills, fever and weight loss.  HENT:  Negative for tinnitus.   Respiratory:  Negative for cough, sputum production and shortness of breath.   Cardiovascular:  Negative for chest pain, palpitations and leg swelling.  Gastrointestinal:  Negative for abdominal pain, constipation, diarrhea and heartburn.  Genitourinary:  Negative for dysuria, frequency and urgency.  Musculoskeletal:  Positive for joint pain and  myalgias. Negative for back pain and falls.  Skin: Negative.   Neurological:  Negative for dizziness and headaches.  Psychiatric/Behavioral:  Negative for depression and memory loss. The patient is nervous/anxious. The patient does not have insomnia.    Past Medical History:  Diagnosis Date   Anxiety and depression    Cataract    Eye abnormality    film over eye that was not cataract   Fall    GERD (gastroesophageal reflux disease)    Hyperlipemia    Hypertension    Osteoarthritis    Thyroid disease    Past Surgical History:  Procedure Laterality Date   ABDOMINAL HYSTERECTOMY  1978   BREAST LUMPECTOMY  1980   SALIVARY STONE REMOVAL  1980's   TONSILLECTOMY  1950's   Social History:   reports that she has never smoked. She has never used smokeless tobacco. She reports that she does not drink alcohol and does not use drugs.  Family History  Problem Relation Age of Onset   Hypertension Mother    Heart disease Mother    Heart attack Father 78   Heart disease Father    Heart attack Sister    Arthritis Sister    Diabetes Brother     Medications: Patient's Medications  New Prescriptions   No medications on file  Previous Medications   ACETAMINOPHEN (TYLENOL) 500 MG TABLET    Take 500 mg by mouth 2 (two) times daily.   ASPIRIN EC 81 MG TABLET    Take 81 mg by mouth daily.  CHOLECALCIFEROL (VITAMIN D3) 2000 UNITS TABS    Take 1 tablet by mouth daily.   DICYCLOMINE (BENTYL) 20 MG TABLET    Take 1 tablet (20 mg total) by mouth 2 (two) times daily.   DULOXETINE (CYMBALTA) 60 MG CAPSULE    Take 1 capsule (60 mg total) by mouth daily.   ESOMEPRAZOLE (NEXIUM) 40 MG CAPSULE    Take 1 capsule (40 mg total) by mouth daily at 12 noon.   GLUCOSAMINE-CHONDROITIN 500-400 MG TABLET    Take 1 tablet by mouth 2 (two) times daily.    KRILL OIL (OMEGA-3) 500 MG CAPS    Take 1 capsule by mouth 2 (two) times daily.    LISINOPRIL-HYDROCHLOROTHIAZIDE (ZESTORETIC) 10-12.5 MG TABLET    Take 1  tablet by mouth daily.   LOPERAMIDE (IMODIUM A-D) 2 MG TABLET    Take 1 tablet (2 mg total) by mouth 3 (three) times daily as needed for diarrhea or loose stools (for diarrhea).   METOPROLOL SUCCINATE (TOPROL-XL) 50 MG 24 HR TABLET    Take 1 tablet (50 mg total) by mouth daily. Take with or immediately following a meal.   PROBIOTIC PRODUCT (PROBIOTIC DAILY PO)    Take 1 tablet by mouth in the morning and at bedtime.    ROSUVASTATIN (CRESTOR) 5 MG TABLET    1 tablet by mouth three times weekly   TURMERIC CURCUMIN 500 MG CAPS    Take 1 capsule by mouth 2 (two) times daily.   Modified Medications   No medications on file  Discontinued Medications   No medications on file    Physical Exam:  Vitals:   11/21/20 1055  BP: 112/76  Pulse: 71  Temp: (!) 97.1 F (36.2 C)  TempSrc: Temporal  SpO2: 95%  Weight: 205 lb (93 kg)  Height: 5' 4"  (1.626 m)   Body mass index is 34.57 kg/m. Wt Readings from Last 3 Encounters:  06/01/20 201 lb 6.4 oz (91.4 kg)  01/30/20 200 lb (90.7 kg)  12/26/19 208 lb 3.2 oz (94.4 kg)    Physical Exam Constitutional:      General: She is not in acute distress.    Appearance: She is well-developed. She is not diaphoretic.  HENT:     Head: Normocephalic and atraumatic.     Mouth/Throat:     Pharynx: No oropharyngeal exudate.  Eyes:     Conjunctiva/sclera: Conjunctivae normal.     Pupils: Pupils are equal, round, and reactive to light.  Cardiovascular:     Rate and Rhythm: Normal rate and regular rhythm.     Heart sounds: Normal heart sounds.  Pulmonary:     Effort: Pulmonary effort is normal.     Breath sounds: Normal breath sounds.  Abdominal:     General: Bowel sounds are normal.     Palpations: Abdomen is soft.  Musculoskeletal:     Cervical back: Normal range of motion and neck supple.     Right knee: Crepitus present.     Left knee: Crepitus present.     Right lower leg: No edema.     Left lower leg: No edema.  Skin:    General: Skin is  warm and dry.  Neurological:     Mental Status: She is alert.  Psychiatric:        Mood and Affect: Mood normal.    Labs reviewed: Basic Metabolic Panel: Recent Labs    01/11/20 0811 06/01/20 1219  NA 140 142  K 4.1 5.1  CL  106 104  CO2 24 29  GLUCOSE 105* 102*  BUN 16 18  CREATININE 0.76 0.67  CALCIUM 9.1 10.0   Liver Function Tests: Recent Labs    06/01/20 1219  AST 18  ALT 14  BILITOT 0.6  PROT 6.8   No results for input(s): LIPASE, AMYLASE in the last 8760 hours. No results for input(s): AMMONIA in the last 8760 hours. CBC: Recent Labs    01/11/20 0811  WBC 9.0  NEUTROABS 4,392  HGB 14.2  HCT 44.4  MCV 91.4  PLT 303   Lipid Panel: Recent Labs    01/11/20 0811 06/01/20 1219  CHOL 122 188  HDL 35* 57  LDLCALC 58 97  TRIG 229* 216*  CHOLHDL 3.5 3.3   TSH: No results for input(s): TSH in the last 8760 hours. A1C: Lab Results  Component Value Date   HGBA1C 6.2 (H) 06/01/2020     Assessment/Plan 1. Chronic fatigue -ongoing, has a lot of anxiety which is likely contributing.  -encouraged proper eating habits and increase in physical activity. Will follow up labs as well today.  - CBC with Differential/Platelet - CMP with eGFR([facility]) - TSH  2. Essential hypertension -Blood pressure well controlled Continue current medications Recheck metabolic panel  - CBC with Differential/Platelet - CMP with eGFR([facility])  3. Mixed hyperlipidemia --continues on crestor and tolerating well.. LDL at goal. Continue dietary modifications with medication management.  - Lipid Panel - CMP with eGFR([facility])  4. Hyperglycemia The patient is asked to make an attempt to improve diet and exercise patterns to aid in medical management of this problem. - Hemoglobin A1c  5. Irritable bowel syndrome with diarrhea -The current medical regimen is effective;  continue present plan and medications as needed  6. Primary osteoarthritis involving multiple joints -stable  at this time, continues to follow up with ortho as needed  7. Anxiety and depression Ongoing, continues on Cymbalta, discussed nonpharmacologic interventions as well.    Next appt: 6 months. Sooner if needed [employer]. [place], [facility] [phone]

## 2020-11-22 ENCOUNTER — Other Ambulatory Visit: Payer: Self-pay | Admitting: Nurse Practitioner

## 2020-11-22 DIAGNOSIS — F419 Anxiety disorder, unspecified: Secondary | ICD-10-CM

## 2020-11-22 DIAGNOSIS — I10 Essential (primary) hypertension: Secondary | ICD-10-CM

## 2020-11-22 LAB — CBC WITH DIFFERENTIAL/PLATELET
Absolute Monocytes: 537 cells/uL (ref 200–950)
Basophils Absolute: 49 cells/uL (ref 0–200)
Basophils Relative: 0.8 %
Eosinophils Absolute: 140 cells/uL (ref 15–500)
Eosinophils Relative: 2.3 %
HCT: 40.8 % (ref 35.0–45.0)
Hemoglobin: 13.6 g/dL (ref 11.7–15.5)
Lymphs Abs: 3648 cells/uL (ref 850–3900)
MCH: 30.4 pg (ref 27.0–33.0)
MCHC: 33.3 g/dL (ref 32.0–36.0)
MCV: 91.1 fL (ref 80.0–100.0)
MPV: 10.1 fL (ref 7.5–12.5)
Monocytes Relative: 8.8 %
Neutro Abs: 1726 cells/uL (ref 1500–7800)
Neutrophils Relative %: 28.3 %
Platelets: 284 10*3/uL (ref 140–400)
RBC: 4.48 10*6/uL (ref 3.80–5.10)
RDW: 12.4 % (ref 11.0–15.0)
Total Lymphocyte: 59.8 %
WBC: 6.1 10*3/uL (ref 3.8–10.8)

## 2020-11-22 LAB — COMPLETE METABOLIC PANEL WITH GFR
AG Ratio: 1.9 (calc) (ref 1.0–2.5)
ALT: 10 U/L (ref 6–29)
AST: 16 U/L (ref 10–35)
Albumin: 4.3 g/dL (ref 3.6–5.1)
Alkaline phosphatase (APISO): 167 U/L — ABNORMAL HIGH (ref 37–153)
BUN: 17 mg/dL (ref 7–25)
CO2: 25 mmol/L (ref 20–32)
Calcium: 9.7 mg/dL (ref 8.6–10.4)
Chloride: 105 mmol/L (ref 98–110)
Creat: 0.74 mg/dL (ref 0.60–0.95)
Globulin: 2.3 g/dL (calc) (ref 1.9–3.7)
Glucose, Bld: 104 mg/dL — ABNORMAL HIGH (ref 65–99)
Potassium: 4.7 mmol/L (ref 3.5–5.3)
Sodium: 140 mmol/L (ref 135–146)
Total Bilirubin: 0.5 mg/dL (ref 0.2–1.2)
Total Protein: 6.6 g/dL (ref 6.1–8.1)
eGFR: 81 mL/min/{1.73_m2} (ref >=60)

## 2020-11-22 LAB — LIPID PANEL
Cholesterol: 193 mg/dL (ref <200)
HDL: 62 mg/dL (ref >=50)
LDL Cholesterol (Calc): 101 mg/dL (calc) — ABNORMAL HIGH
Non-HDL Cholesterol (Calc): 131 mg/dL (calc) — ABNORMAL HIGH (ref <130)
Total CHOL/HDL Ratio: 3.1 (calc) (ref <5.0)
Triglycerides: 181 mg/dL — ABNORMAL HIGH (ref <150)

## 2020-11-22 LAB — HEMOGLOBIN A1C
Hgb A1c MFr Bld: 5.4 % of total Hgb (ref <5.7)
Mean Plasma Glucose: 108 mg/dL
eAG (mmol/L): 6 mmol/L

## 2020-11-22 LAB — TSH: TSH: 1.28 mIU/L (ref 0.40–4.50)

## 2020-11-28 ENCOUNTER — Other Ambulatory Visit: Payer: Self-pay | Admitting: Nurse Practitioner

## 2020-11-28 DIAGNOSIS — E782 Mixed hyperlipidemia: Secondary | ICD-10-CM

## 2021-01-01 ENCOUNTER — Other Ambulatory Visit: Payer: Self-pay | Admitting: *Deleted

## 2021-01-01 DIAGNOSIS — E782 Mixed hyperlipidemia: Secondary | ICD-10-CM

## 2021-01-01 MED ORDER — ROSUVASTATIN CALCIUM 5 MG PO TABS: ORAL_TABLET | ORAL | 1 refills | Status: DC

## 2021-01-01 NOTE — Telephone Encounter (Signed)
Patient lost pill bottle and requested new refill.

## 2021-01-14 ENCOUNTER — Other Ambulatory Visit: Payer: Self-pay | Admitting: *Deleted

## 2021-01-14 MED ORDER — DICYCLOMINE HCL 20 MG PO TABS
20.0000 mg | ORAL_TABLET | Freq: Two times a day (BID) | ORAL | 1 refills | Status: DC
Start: 2021-01-14 — End: 2021-06-11

## 2021-01-14 NOTE — Telephone Encounter (Signed)
Pharmacy requested refill

## 2021-02-04 IMAGING — RF DG ESOPHAGUS
3 series · 14 of 20 positions shown · non-contrast
Comparison: None.

CLINICAL DATA: Esophageal dysphagia.

EXAM:
ESOPHOGRAM / BARIUM SWALLOW / BARIUM TABLET STUDY
TECHNIQUE: Combined double contrast and single contrast examination performed
using effervescent crystals, thick barium liquid, and thin barium
liquid. The patient was observed with fluoroscopy swallowing a 13 mm
barium sulphate tablet.
FLUOROSCOPY TIME:  Fluoroscopy Time:  2 minutes 48 seconds
Radiation Exposure Index (if provided by the fluoroscopic device):
154.0 mGy
Number of Acquired Spot Images: 0

[Series 1: sequence · 3 of 27 frames shown (1 of 2)]
[frame 5/27]
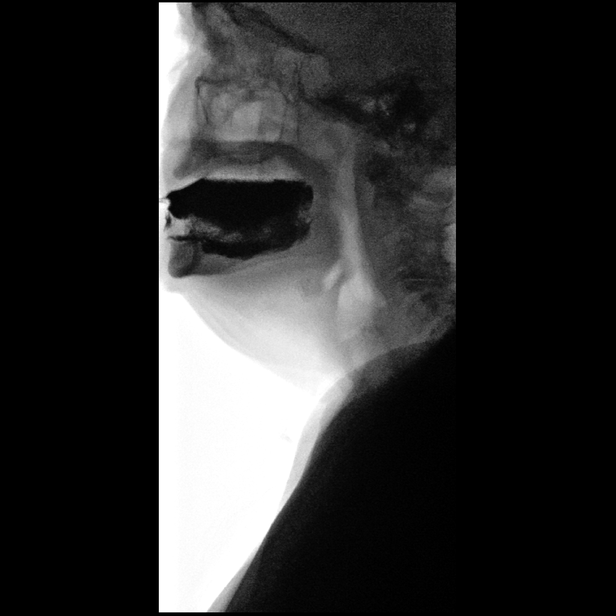
[frame 14/27]
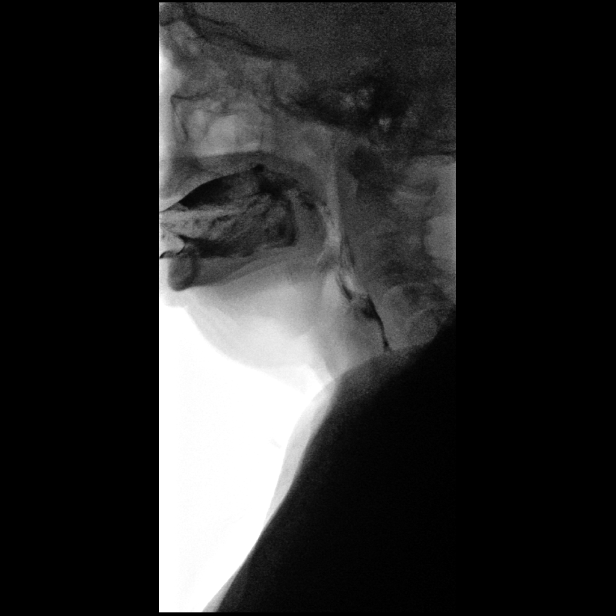
[frame 23/27]
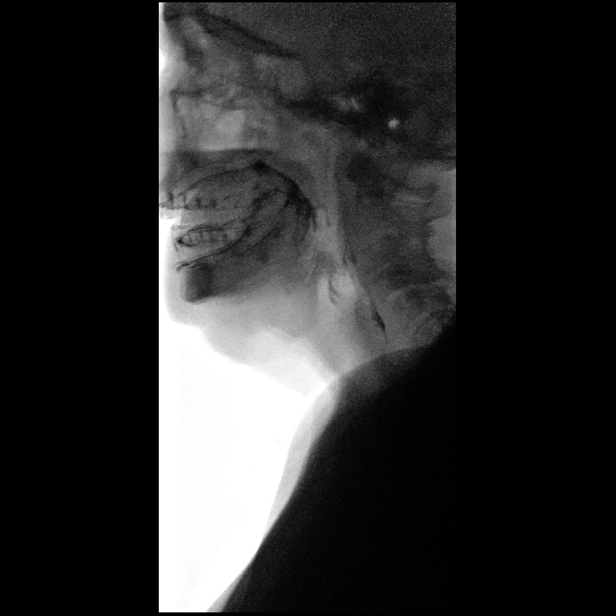

[Series 2: sequence · 3 of 25 frames shown (2 of 2)]
[frame 7/25]
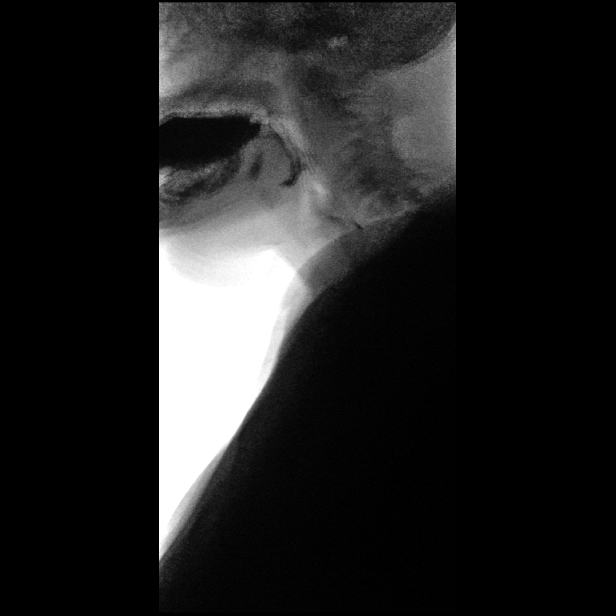
[frame 13/25]
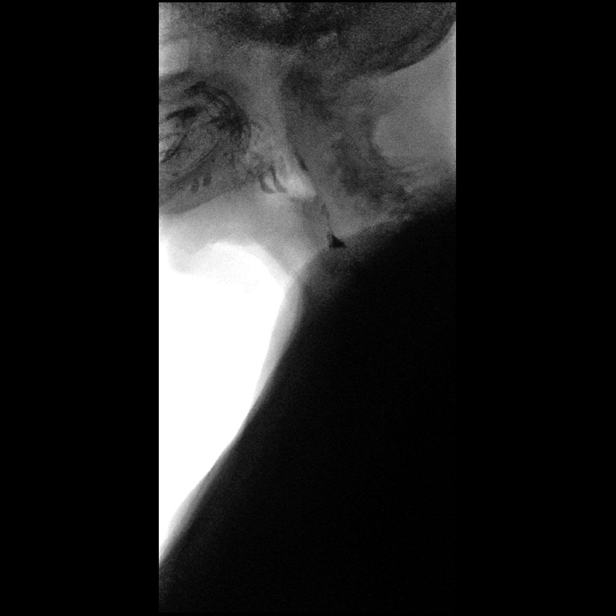
[frame 22/25]
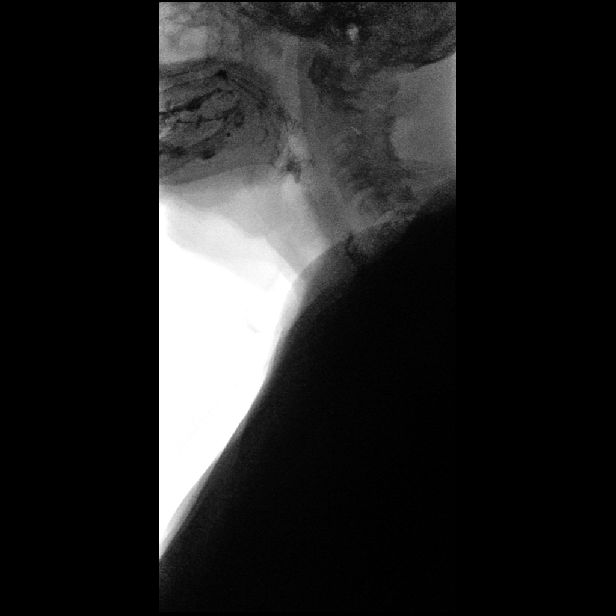

[Series 3: one shot · 8 of 12 slices shown]
[im 2/12]
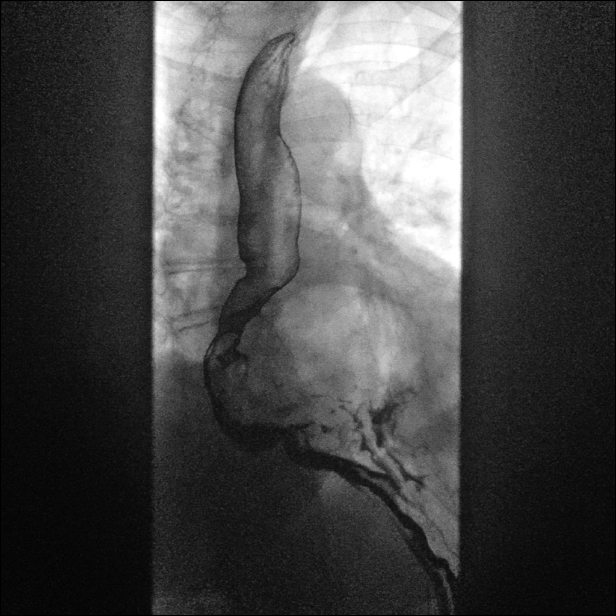
[im 3/12]
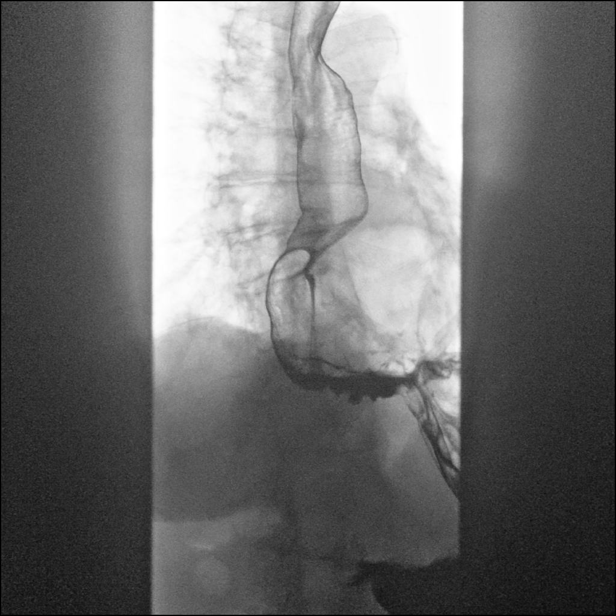
[im 5/12]
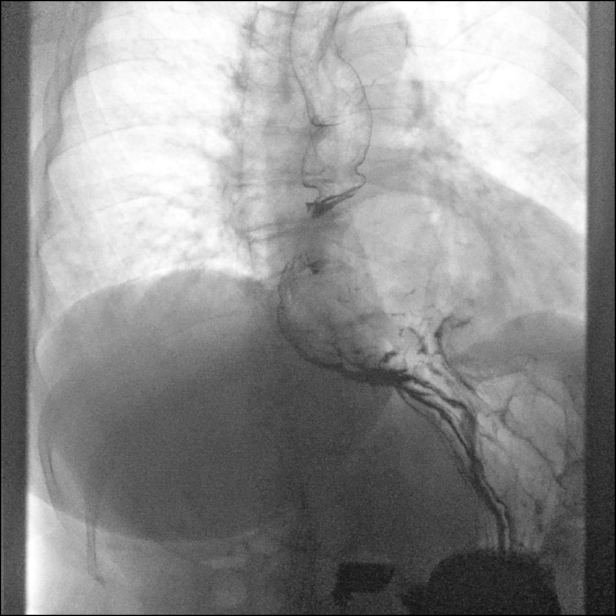
[im 6/12]
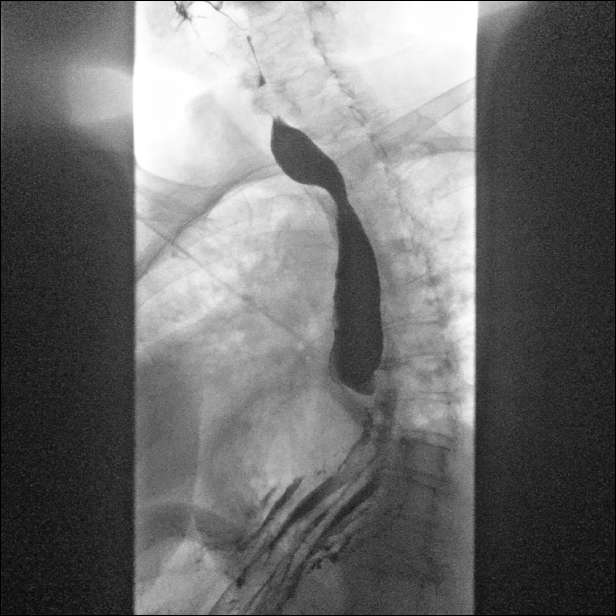
[im 8/12]
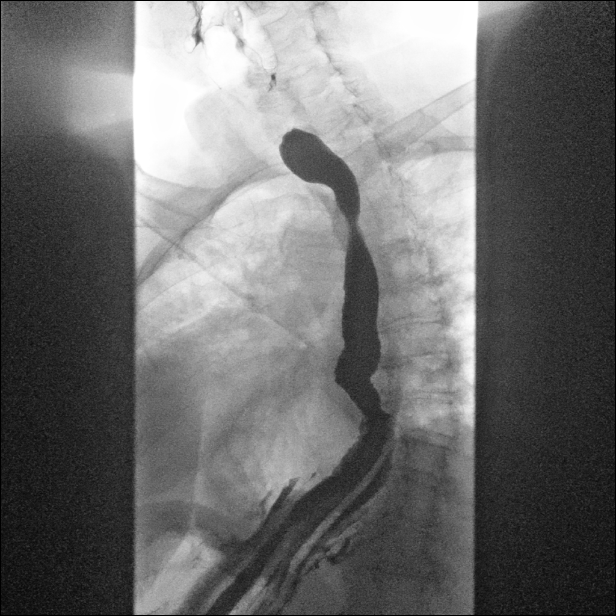
[im 9/12]
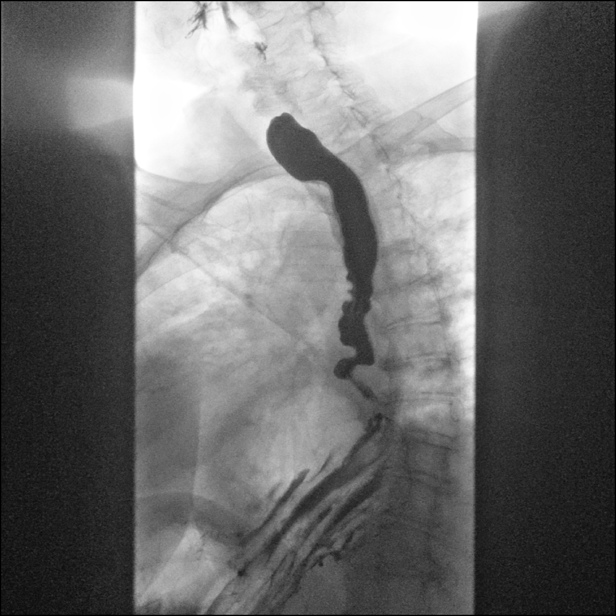
[im 10/12]
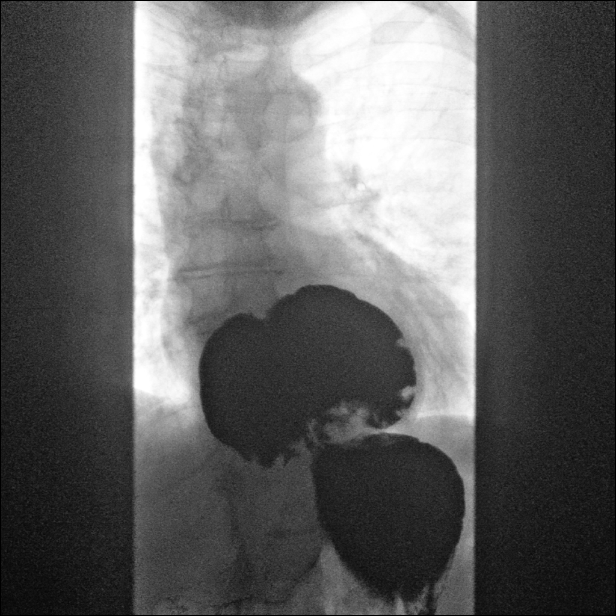
[im 12/12]
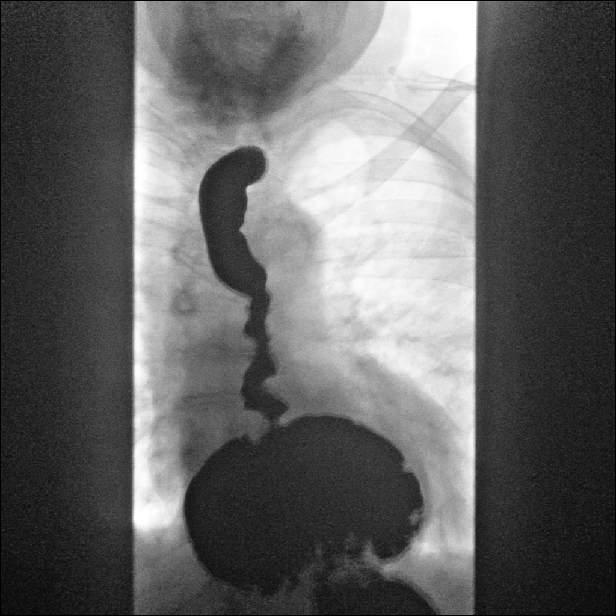

[14 of 20 positions shown; findings below may reference images not displayed]

FINDINGS: Cine fluoroscopy during swallowing shows no evidence of vestibular
penetration or aspiration. Prominent cricopharyngeal impression is
seen on the posterior wall of the cervical esophagus, however this
does impede passage of barium. No evidence of Ouy diverticulum.

No evidence of esophageal mass or stricture. No findings of
esophagitis noted. Moderate esophageal dysmotility is noted, with
break up of primary peristalsis and intermittent tertiary
contractions.

A moderate sliding hiatal hernia is seen. Gastroesophageal reflux
was seen to the level of the proximal thoracic esophagus. An
ingested 13mm barium tablet passed freely through the esophagus, and
into the stomach.
IMPRESSION: Moderate sliding hiatal hernia with gastroesophageal reflux. No
evidence of esophageal stricture.

Moderate presbyesophagus.

Prominent cricopharyngeal impression noted during swallowing. No
evidence of Ouy diverticulum.

## 2021-02-21 ENCOUNTER — Other Ambulatory Visit: Payer: Self-pay

## 2021-02-21 ENCOUNTER — Telehealth (INDEPENDENT_AMBULATORY_CARE_PROVIDER_SITE_OTHER): Payer: Medicare HMO | Admitting: Nurse Practitioner

## 2021-02-21 DIAGNOSIS — J069 Acute upper respiratory infection, unspecified: Secondary | ICD-10-CM

## 2021-02-21 NOTE — Progress Notes (Signed)
Careteam: Patient Care Team: [name], NP as PCP - General (Geriatric Medicine) Jethro Bolus, MD as Consulting Physician (Ophthalmology)  Advanced Directive information    Allergies  Allergen Reactions   Amoxicillin Diarrhea    Chief Complaint  Patient presents with   Acute Visit    Patient complains of diarrhea, sore throat and a lot of mucous for 3 days. Diarrhea started this morning. Patient taking chloraseptic for throat. Patient has been taking Mucinex. Having a lot of coughing. Patient had headache Monday and Tuesday. No fever. Patient has not taking a COVID test.Patient received Flu vaccine about 3 weeks ago     HPI: Patient is a 80 y.o. female for 2 days.  She has had a lot of mucinex for several days.  Continues to feel worse each day.  Has some shortness of breath on exertion but otherwise okay.  Mucinex and sore throat the most bothersome.  Using some chloraseptic to help with sore throat.  Also has a cough- mild and not often.  Pulse ox 92% 156/82 bp 89 HR  No fever Aches all over but could be arthritis.  Fatigue.  Feels like she has the flu No N/V    Review of Systems:  Review of Systems  Constitutional:  Positive for malaise/fatigue. Negative for chills and fever.  HENT:  Positive for congestion and sore throat. Negative for ear discharge, ear pain, hearing loss, nosebleeds, sinus pain and tinnitus.   Respiratory:  Positive for cough. Negative for sputum production, shortness of breath and wheezing.   Musculoskeletal:  Positive for myalgias.  Neurological:  Negative for dizziness and headaches.   Past Medical History:  Diagnosis Date   Anxiety and depression    Cataract    Eye abnormality    film over eye that was not cataract   Fall    GERD (gastroesophageal reflux disease)    Hyperlipemia    Hypertension    Osteoarthritis    Thyroid disease    Past Surgical History:  Procedure Laterality Date   ABDOMINAL HYSTERECTOMY  1978    BREAST LUMPECTOMY  1980   SALIVARY STONE REMOVAL  1980's   TONSILLECTOMY  1950's   Social History:   reports that she has never smoked. She has never used smokeless tobacco. She reports that she does not drink alcohol and does not use drugs.  Family History  Problem Relation Age of Onset   Hypertension Mother    Heart disease Mother    Heart attack Father 52   Heart disease Father    Heart attack Sister    Arthritis Sister    Diabetes Brother     Medications: Patient's Medications  New Prescriptions   No medications on file  Previous Medications   ACETAMINOPHEN (TYLENOL) 500 MG TABLET    Take 500 mg by mouth 2 (two) times daily.   ASPIRIN EC 81 MG TABLET    Take 81 mg by mouth daily.   CHOLECALCIFEROL (VITAMIN D3) 2000 UNITS TABS    Take 1 tablet by mouth daily.   DICYCLOMINE (BENTYL) 20 MG TABLET    Take 1 tablet (20 mg total) by mouth 2 (two) times daily.   DULOXETINE (CYMBALTA) 60 MG CAPSULE    TAKE 1 CAPSULE EVERY DAY   ESOMEPRAZOLE (NEXIUM) 40 MG CAPSULE    Take 1 capsule (40 mg total) by mouth daily at 12 noon.   GLUCOSAMINE-CHONDROITIN 500-400 MG TABLET    Take 1 tablet by mouth 2 (two) times  daily.    KRILL OIL (OMEGA-3) 500 MG CAPS    Take 1 capsule by mouth 2 (two) times daily.    LISINOPRIL-HYDROCHLOROTHIAZIDE (ZESTORETIC) 10-12.5 MG TABLET    Take 1 tablet by mouth daily.   LOPERAMIDE (IMODIUM A-D) 2 MG TABLET    Take 1 tablet (2 mg total) by mouth 3 (three) times daily as needed for diarrhea or loose stools (for diarrhea).   METOPROLOL SUCCINATE (TOPROL-XL) 50 MG 24 HR TABLET    TAKE 1 TABLET EVERY DAY WITH OR IMMEDIATELY FOLLOWING A MEAL.   PROBIOTIC PRODUCT (PROBIOTIC DAILY PO)    Take 1 tablet by mouth in the morning and at bedtime.    ROSUVASTATIN (CRESTOR) 5 MG TABLET    TAKE 1 TABLET THREE TIMES WEEKLY   TURMERIC CURCUMIN 500 MG CAPS    Take 1 capsule by mouth 2 (two) times daily.   Modified Medications   No medications on file  Discontinued Medications   No  medications on file    Physical Exam:  There were no vitals filed for this visit. There is no height or weight on file to calculate BMI. Wt Readings from Last 3 Encounters:  11/21/20 205 lb (93 kg)  06/01/20 201 lb 6.4 oz (91.4 kg)  01/30/20 200 lb (90.7 kg)    Physical Exam Constitutional:      Appearance: Normal appearance.  Neurological:     Mental Status: She is alert.  Limited due to virtual visit, able to speak without shortness of breath. No increase in work of breathing.   Labs reviewed: Basic Metabolic Panel: Recent Labs    06/01/20 1219 11/21/20 1132  NA 142 140  K 5.1 4.7  CL 104 105  CO2 29 25  GLUCOSE 102* 104*  BUN 18 17  CREATININE 0.67 0.74  CALCIUM 10.0 9.7  TSH  --  1.28   Liver Function Tests: Recent Labs    06/01/20 1219 11/21/20 1132  AST 18 16  ALT 14 10  BILITOT 0.6 0.5  PROT 6.8 6.6   No results for input(s): LIPASE, AMYLASE in the last 8760 hours. No results for input(s): AMMONIA in the last 8760 hours. CBC: Recent Labs    11/21/20 1132  WBC 6.1  NEUTROABS 1,726  HGB 13.6  HCT 40.8  MCV 91.1  PLT 284   Lipid Panel: Recent Labs    06/01/20 1219 11/21/20 1132  CHOL 188 193  HDL 57 62  LDLCALC 97 101*  TRIG 216* 181*  CHOLHDL 3.3 3.1   TSH: Recent Labs    11/21/20 1132  TSH 1.28   A1C: Lab Results  Component Value Date   HGBA1C 5.4 11/21/2020     Assessment/Plan 1. Viral URI Supportive care at this time.  Plain nasal saline spray throughout the day as needed May use tylenol 325 mg 2 tablets every 6 hours as needed aches and pains or sore throat humidifier in the home to help with the dry air Mucinex DM by mouth twice daily as needed for cough and congestion with full glass of water  Keep well hydrated -vit C 1000 mg twice daily' -vit d 2000 units daily -zinc 50 mg by mouth daily  To call or seek medical attention if symptoms worsen, fever occur, shortness of breath.   Janene Harvey. Biagio Borg  [facility] [phone]    Virtual Visit via mychart video  I connected with patient on 02/21/21 at  9:30 AM EST by  video and verified that I am speaking with the correct person using two identifiers.  Location: Patient: home Provider: [facility].    I discussed the limitations, risks, security and privacy concerns of performing an evaluation and management service by telephone and the availability of in person appointments. I also discussed with the patient that there may be a patient responsible charge related to this service. The patient expressed understanding and agreed to proceed.   I discussed the assessment and treatment plan with the patient. The patient was provided an opportunity to ask questions and all were answered. The patient agreed with the plan and demonstrated an understanding of the instructions.   The patient was advised to call back or seek an in-person evaluation if the symptoms worsen or if the condition fails to improve as anticipated.  I provided 15 minutes of non-face-to-face time during this encounter.  Janene Harvey. Biagio Borg

## 2021-02-21 NOTE — Progress Notes (Signed)
This service is provided via telemedicine  No vital signs collected/recorded due to the encounter was a telemedicine visit.   Location of patient (ex: home, work):  Home  Patient consents to a telephone visit:  Yes, see encounter dated 02/21/2021  Location of the provider (ex: office, home):  [facility]  Name of any referring provider:  N/A  Names of all persons participating in the telemedicine service and their role in the encounter:  [name], Nurse Practitioner, Elveria Royals, CMA, and patient.   Time spent on call:  9 minutes with medical assistant

## 2021-02-26 ENCOUNTER — Telehealth: Payer: Self-pay | Admitting: *Deleted

## 2021-02-26 NOTE — Telephone Encounter (Signed)
To use mucinex DM - the DM should help with the cough- instead of plain mucinex. She can continue to take this twice daily with full glass of water.

## 2021-02-26 NOTE — Telephone Encounter (Signed)
Patient called and stated that she had a TeleVisit on 11/17 for URI. Stated that she is better but still has some congestion and cough.   Wants to know if she should continue taking the Mucinex and if she can get something for her cough. No other symptoms noted.   Please Advise.

## 2021-02-26 NOTE — Telephone Encounter (Signed)
Patient notified and agreed.  

## 2021-03-25 ENCOUNTER — Telehealth: Payer: Self-pay | Admitting: *Deleted

## 2021-03-25 NOTE — Telephone Encounter (Signed)
Would not recommend taking this as celebrex is an NSAID and can have adverse effects on kidney and Gastrointestinal track.

## 2021-03-25 NOTE — Telephone Encounter (Signed)
Patient called and stated that her son came home Saturday for Christmas.   Stated that she has been having problems with her Arthritis and he gave her one of his Celebrex and she stated that she took it and feels much better.   Patient is wanting to know if you would prescribe it for her.   Please Advise.

## 2021-03-25 NOTE — Telephone Encounter (Signed)
It just depends on what exactly is causing her the most pain/discomfort. If she would like she can make appt to discuss/evaluate.

## 2021-03-25 NOTE — Telephone Encounter (Signed)
Patient scheduled an appointment with Shanda Bumps for 12/21

## 2021-03-25 NOTE — Telephone Encounter (Signed)
Patient Notified. Do you recommend anything safe that she can take for the Arthritis other than Celebrex.  Stated that the Tumeric and Fish oil and Tylenol is not helping.   Stated that she felt so much better with the Celebrex. Is there anything equal to the Celebrex that she can take that's safe. Stated that her mother that lived to 68 took it and did well on it.   Please Advise.

## 2021-03-27 ENCOUNTER — Encounter: Payer: Self-pay | Admitting: Nurse Practitioner

## 2021-03-27 ENCOUNTER — Ambulatory Visit (INDEPENDENT_AMBULATORY_CARE_PROVIDER_SITE_OTHER): Payer: Medicare HMO | Admitting: Nurse Practitioner

## 2021-03-27 ENCOUNTER — Other Ambulatory Visit: Payer: Self-pay

## 2021-03-27 VITALS — BP 118/76 | HR 59 | Temp 97.9°F | Wt 206.0 lb

## 2021-03-27 DIAGNOSIS — I10 Essential (primary) hypertension: Secondary | ICD-10-CM

## 2021-03-27 DIAGNOSIS — E6609 Other obesity due to excess calories: Secondary | ICD-10-CM | POA: Diagnosis not present

## 2021-03-27 DIAGNOSIS — Z6834 Body mass index (BMI) 34.0-34.9, adult: Secondary | ICD-10-CM | POA: Diagnosis not present

## 2021-03-27 DIAGNOSIS — M159 Polyosteoarthritis, unspecified: Secondary | ICD-10-CM

## 2021-03-27 MED ORDER — MELOXICAM 7.5 MG PO TABS: 7.5000 mg | ORAL_TABLET | Freq: Every day | ORAL | 1 refills | Status: DC | PRN

## 2021-03-27 NOTE — Progress Notes (Signed)
Careteam: Patient Care Team: [name], NP as PCP - General (Geriatric Medicine) Jethro Bolus, MD as Consulting Physician (Ophthalmology)  PLACE OF SERVICE:  [facility]  Advanced Directive information    Allergies  Allergen Reactions   Amoxicillin Diarrhea    Chief Complaint  Patient presents with   Acute Visit    Patient c/o arthritis pain, progressively getting worse. Patient tried celebrex (son's rx) and it helped with the arthritis pain.      HPI: Patient is a 80 y.o. female due to progressive arthritis.  Reports she took her son celebrex and had amazing results. Mental health as well as physical was improved.   Reports hands were swollen and knuckles  Lower back, shoulder pain, low back. Reports she has progressively been hurting for years.  This past week she had an excerebration in the pain and it was much worse. Felt like she would not be able to get up the next day.   She can not wear shoes due to the pain.   She reports her mother took celebrex until she was 100 and did not have any problems with it.  Reports her quality of life has been significantly reduced   Toes are tender. Feels like she has shoes on that do not fit.  Reports burning at times.  Has seen podiatrist in the past.    Review of Systems:  Review of Systems  Constitutional:  Negative for chills and fever.  Cardiovascular:  Negative for leg swelling.  Gastrointestinal: Negative.   Musculoskeletal:  Positive for back pain, joint pain and myalgias.  Skin: Negative.   Neurological: Negative.    Past Medical History:  Diagnosis Date   Anxiety and depression    Cataract    Eye abnormality    film over eye that was not cataract   Fall    GERD (gastroesophageal reflux disease)    Hyperlipemia    Hypertension    Osteoarthritis    Thyroid disease    Past Surgical History:  Procedure Laterality Date   ABDOMINAL HYSTERECTOMY  1978   BREAST LUMPECTOMY  1980   SALIVARY STONE  REMOVAL  1980's   TONSILLECTOMY  1950's   Social History:   reports that she has never smoked. She has never used smokeless tobacco. She reports that she does not drink alcohol and does not use drugs.  Family History  Problem Relation Age of Onset   Hypertension Mother    Heart disease Mother    Heart attack Father 62   Heart disease Father    Heart attack Sister    Arthritis Sister    Diabetes Brother     Medications: Patient's Medications  New Prescriptions   No medications on file  Previous Medications   ACETAMINOPHEN (TYLENOL) 500 MG TABLET    Take 500 mg by mouth 2 (two) times daily.   ASPIRIN EC 81 MG TABLET    Take 81 mg by mouth daily.   CHOLECALCIFEROL (VITAMIN D3) 2000 UNITS TABS    Take 1 tablet by mouth daily.   DICYCLOMINE (BENTYL) 20 MG TABLET    Take 1 tablet (20 mg total) by mouth 2 (two) times daily.   DULOXETINE (CYMBALTA) 60 MG CAPSULE    TAKE 1 CAPSULE EVERY DAY   ESOMEPRAZOLE (NEXIUM) 40 MG CAPSULE    Take 1 capsule (40 mg total) by mouth daily at 12 noon.   GLUCOSAMINE-CHONDROITIN 500-400 MG TABLET    Take 1 tablet by mouth 2 (  two) times daily.    KRILL OIL (OMEGA-3) 500 MG CAPS    Take 1 capsule by mouth 2 (two) times daily.    LISINOPRIL-HYDROCHLOROTHIAZIDE (ZESTORETIC) 10-12.5 MG TABLET    Take 1 tablet by mouth daily.   LOPERAMIDE (IMODIUM A-D) 2 MG TABLET    Take 1 tablet (2 mg total) by mouth 3 (three) times daily as needed for diarrhea or loose stools (for diarrhea).   METOPROLOL SUCCINATE (TOPROL-XL) 50 MG 24 HR TABLET    TAKE 1 TABLET EVERY DAY WITH OR IMMEDIATELY FOLLOWING A MEAL.   PROBIOTIC PRODUCT (PROBIOTIC DAILY PO)    Take 1 tablet by mouth in the morning and at bedtime.    ROSUVASTATIN (CRESTOR) 5 MG TABLET    TAKE 1 TABLET THREE TIMES WEEKLY   TURMERIC CURCUMIN 500 MG CAPS    Take 1 capsule by mouth 2 (two) times daily.   Modified Medications   No medications on file  Discontinued Medications   No medications on file    Physical  Exam:  Vitals:   03/27/21 0859  BP: 118/76  Pulse: (!) 59  Temp: 97.9 F (36.6 C)  TempSrc: Temporal  SpO2: 92%  Weight: 206 lb (93.4 kg)   Body mass index is 35.36 kg/m. Wt Readings from Last 3 Encounters:  03/27/21 206 lb (93.4 kg)  11/21/20 205 lb (93 kg)  06/01/20 201 lb 6.4 oz (91.4 kg)    Physical Exam Constitutional:      General: She is not in acute distress.    Appearance: She is well-developed. She is not diaphoretic.  HENT:     Head: Normocephalic and atraumatic.     Mouth/Throat:     Pharynx: No oropharyngeal exudate.  Eyes:     Conjunctiva/sclera: Conjunctivae normal.     Pupils: Pupils are equal, round, and reactive to light.  Cardiovascular:     Rate and Rhythm: Normal rate and regular rhythm.     Heart sounds: Normal heart sounds.  Pulmonary:     Effort: Pulmonary effort is normal.     Breath sounds: Normal breath sounds.  Abdominal:     General: Bowel sounds are normal.     Palpations: Abdomen is soft.  Musculoskeletal:     Cervical back: Normal range of motion and neck supple.     Right lower leg: No edema.     Left lower leg: No edema.     Comments: Arthritis changes noted to joints on bilateral hands in fingers and toes to bilateral feet.   Skin:    General: Skin is warm and dry.  Neurological:     Mental Status: She is alert.  Psychiatric:        Mood and Affect: Mood normal.    Labs reviewed: Basic Metabolic Panel: Recent Labs    06/01/20 1219 11/21/20 1132  NA 142 140  K 5.1 4.7  CL 104 105  CO2 29 25  GLUCOSE 102* 104*  BUN 18 17  CREATININE 0.67 0.74  CALCIUM 10.0 9.7  TSH  --  1.28   Liver Function Tests: Recent Labs    06/01/20 1219 11/21/20 1132  AST 18 16  ALT 14 10  BILITOT 0.6 0.5  PROT 6.8 6.6   No results for input(s): LIPASE, AMYLASE in the last 8760 hours. No results for input(s): AMMONIA in the last 8760 hours. CBC: Recent Labs    11/21/20 1132  WBC 6.1  NEUTROABS 1,726  HGB 13.6  HCT 40.8  MCV 91.1  PLT 284   Lipid Panel: Recent Labs    06/01/20 1219 11/21/20 1132  CHOL 188 193  HDL 57 62  LDLCALC 97 101*  TRIG 216* 181*  CHOLHDL 3.3 3.1   TSH: Recent Labs    11/21/20 1132  TSH 1.28   A1C: Lab Results  Component Value Date   HGBA1C 5.4 11/21/2020     Assessment/Plan 1. Primary osteoarthritis involving multiple joints -significant pain noted to several joints (hands, feet, back, shoulders)  -daughter and pt educated on adverse long term effects of NSAIDs. Kidney function is stable at this time and aware aware risk but would like try meloxicam as needed for when pain is severe as her quality of life has been effected by pain.  If unable to tolerate mobic or worsening renal function discussed starting tramadol to help control pain.  - meloxicam (MOBIC) 7.5 MG tablet; Take 1 tablet (7.5 mg total) by mouth daily as needed for pain.  Dispense: 30 tablet; Refill: 1  2. Essential hypertension -well controlled at this time, to continue to monitor.   3. Class 1 obesity due to excess calories with serious comorbidity and body mass index (BMI) of 34.0 to 34.9 in adult Weight loss overall would benefit   Next appt: 05/22/2021 for follow up on pain and routine follow up  Aranda Bihm K. Biagio Borg  [facility] [phone]

## 2021-03-27 NOTE — Patient Instructions (Signed)
To take meloxicam daily as needed- do not have to take every day Follow up in 4-6 weeks on pain and lab.

## 2021-04-05 ENCOUNTER — Other Ambulatory Visit: Payer: Self-pay | Admitting: Nurse Practitioner

## 2021-04-05 DIAGNOSIS — I10 Essential (primary) hypertension: Secondary | ICD-10-CM

## 2021-05-07 DIAGNOSIS — Z961 Presence of intraocular lens: Secondary | ICD-10-CM | POA: Diagnosis not present

## 2021-05-07 DIAGNOSIS — H524 Presbyopia: Secondary | ICD-10-CM | POA: Diagnosis not present

## 2021-05-07 DIAGNOSIS — H353121 Nonexudative age-related macular degeneration, left eye, early dry stage: Secondary | ICD-10-CM | POA: Diagnosis not present

## 2021-05-07 DIAGNOSIS — H52203 Unspecified astigmatism, bilateral: Secondary | ICD-10-CM | POA: Diagnosis not present

## 2021-05-13 ENCOUNTER — Other Ambulatory Visit: Payer: Self-pay | Admitting: Nurse Practitioner

## 2021-05-13 DIAGNOSIS — M159 Polyosteoarthritis, unspecified: Secondary | ICD-10-CM

## 2021-05-22 ENCOUNTER — Other Ambulatory Visit: Payer: Medicare HMO

## 2021-05-22 ENCOUNTER — Other Ambulatory Visit: Payer: Self-pay

## 2021-05-22 DIAGNOSIS — I10 Essential (primary) hypertension: Secondary | ICD-10-CM | POA: Diagnosis not present

## 2021-05-22 LAB — COMPLETE METABOLIC PANEL WITH GFR
AG Ratio: 1.8 (calc) (ref 1.0–2.5)
ALT: 14 U/L (ref 6–29)
AST: 14 U/L (ref 10–35)
Albumin: 4.1 g/dL (ref 3.6–5.1)
Alkaline phosphatase (APISO): 171 U/L — ABNORMAL HIGH (ref 37–153)
BUN: 21 mg/dL (ref 7–25)
CO2: 32 mmol/L (ref 20–32)
Calcium: 9.7 mg/dL (ref 8.6–10.4)
Chloride: 105 mmol/L (ref 98–110)
Creat: 0.75 mg/dL (ref 0.60–0.95)
Globulin: 2.3 g/dL (calc) (ref 1.9–3.7)
Glucose, Bld: 118 mg/dL (ref 65–139)
Potassium: 5.4 mmol/L — ABNORMAL HIGH (ref 3.5–5.3)
Sodium: 140 mmol/L (ref 135–146)
Total Bilirubin: 0.5 mg/dL (ref 0.2–1.2)
Total Protein: 6.4 g/dL (ref 6.1–8.1)
eGFR: 80 mL/min/{1.73_m2} (ref >=60)

## 2021-05-22 LAB — CBC WITH DIFFERENTIAL/PLATELET
Absolute Monocytes: 510 cells/uL (ref 200–950)
Basophils Absolute: 89 cells/uL (ref 0–200)
Basophils Relative: 1.1 %
Eosinophils Absolute: 348 cells/uL (ref 15–500)
Eosinophils Relative: 4.3 %
HCT: 44.2 % (ref 35.0–45.0)
Hemoglobin: 14.6 g/dL (ref 11.7–15.5)
Lymphs Abs: 3767 cells/uL (ref 850–3900)
MCH: 30.2 pg (ref 27.0–33.0)
MCHC: 33 g/dL (ref 32.0–36.0)
MCV: 91.5 fL (ref 80.0–100.0)
MPV: 11.8 fL (ref 7.5–12.5)
Monocytes Relative: 6.3 %
Neutro Abs: 3386 cells/uL (ref 1500–7800)
Neutrophils Relative %: 41.8 %
Platelets: 279 10*3/uL (ref 140–400)
RBC: 4.83 10*6/uL (ref 3.80–5.10)
RDW: 12.9 % (ref 11.0–15.0)
Total Lymphocyte: 46.5 %
WBC: 8.1 10*3/uL (ref 3.8–10.8)

## 2021-05-24 ENCOUNTER — Other Ambulatory Visit: Payer: Self-pay

## 2021-05-27 ENCOUNTER — Encounter: Payer: Self-pay | Admitting: Nurse Practitioner

## 2021-05-27 ENCOUNTER — Ambulatory Visit (INDEPENDENT_AMBULATORY_CARE_PROVIDER_SITE_OTHER): Payer: Medicare HMO | Admitting: Nurse Practitioner

## 2021-05-27 ENCOUNTER — Other Ambulatory Visit: Payer: Self-pay

## 2021-05-27 VITALS — BP 134/88 | HR 62 | Temp 97.3°F | Ht 64.0 in | Wt 208.0 lb

## 2021-05-27 DIAGNOSIS — I10 Essential (primary) hypertension: Secondary | ICD-10-CM

## 2021-05-27 DIAGNOSIS — M159 Polyosteoarthritis, unspecified: Secondary | ICD-10-CM

## 2021-05-27 DIAGNOSIS — E875 Hyperkalemia: Secondary | ICD-10-CM

## 2021-05-27 DIAGNOSIS — F339 Major depressive disorder, recurrent, unspecified: Secondary | ICD-10-CM

## 2021-05-27 DIAGNOSIS — K580 Irritable bowel syndrome with diarrhea: Secondary | ICD-10-CM | POA: Diagnosis not present

## 2021-05-27 DIAGNOSIS — R739 Hyperglycemia, unspecified: Secondary | ICD-10-CM

## 2021-05-27 DIAGNOSIS — K219 Gastro-esophageal reflux disease without esophagitis: Secondary | ICD-10-CM

## 2021-05-27 DIAGNOSIS — E782 Mixed hyperlipidemia: Secondary | ICD-10-CM | POA: Diagnosis not present

## 2021-05-27 LAB — BASIC METABOLIC PANEL WITH GFR
BUN: 22 mg/dL (ref 7–25)
CO2: 27 mmol/L (ref 20–32)
Calcium: 9.9 mg/dL (ref 8.6–10.4)
Chloride: 105 mmol/L (ref 98–110)
Creat: 0.75 mg/dL (ref 0.60–0.95)
Glucose, Bld: 109 mg/dL (ref 65–139)
Potassium: 4.5 mmol/L (ref 3.5–5.3)
Sodium: 139 mmol/L (ref 135–146)
eGFR: 80 mL/min/{1.73_m2} (ref >=60)

## 2021-05-27 NOTE — Patient Instructions (Signed)
Try to increase physical activity daily.  Look up chair activities.  Sit outside for at least 30 mins a day.

## 2021-05-27 NOTE — Progress Notes (Signed)
Careteam: Patient Care Team: Lauree Chandler, NP as PCP - General (Geriatric Medicine) Rutherford Guys, MD as Consulting Physician (Ophthalmology)  PLACE OF SERVICE:  [place] Directive information Does Patient Have a Medical Advance Directive?: Yes, Type of Advance Directive: Out of facility DNR (pink MOST or yellow form), Pre-existing out of facility DNR order (yellow form or pink MOST form): Yellow form placed in chart (order not valid for inpatient use);Pink MOST form placed in chart (order not valid for inpatient use), Does patient want to make changes to medical advance directive?: No - Patient declined  Allergies  Allergen Reactions   Amoxicillin Diarrhea    Chief Complaint  Patient presents with   Medical Management of Chronic Issues    6 month follow-up. Here with daughter Alphonzo Cruise states patient is getting more forgetful and appears down more than usual      HPI: Patient is a 81 y.o. female for routine follow up.   OA- reports she is doing much better on meloxicam, mild aches but nothing like how it was.   Reports she has ongoing fatigue, verge of tears, and frustrated a lot.  She is on cymbalta. Reports worsening sadness when it is "gray" out.  She is sedentary. Does not care to go and do anything.  She does not feel like she is very depressed, has good days and bad days.  Continues on cymbalta 60 mg daily.  Reports she feels good in the evening but has a hard time getting up to go in the morning.   Htn- controlled on lisinopril-hctz and metoprolol  Hyperlipidemia- continues on crestor  GERD- continues on nexium.   IBS- controlled.   Has bunions to feet, wears a lot of flip flops.  Review of Systems:  Review of Systems  Constitutional:  Negative for chills, fever and weight loss.  HENT:  Negative for tinnitus.   Respiratory:  Negative for cough, sputum production and shortness of breath.   Cardiovascular:  Negative for chest pain,  palpitations and leg swelling.  Gastrointestinal:  Negative for abdominal pain, constipation, diarrhea and heartburn.  Genitourinary:  Negative for dysuria, frequency and urgency.  Musculoskeletal:  Positive for joint pain. Negative for back pain, falls and myalgias.  Skin: Negative.   Neurological:  Negative for dizziness and headaches.  Psychiatric/Behavioral:  Positive for depression. Negative for memory loss. The patient does not have insomnia.    Past Medical History:  Diagnosis Date   Anxiety and depression    Cataract    Eye abnormality    film over eye that was not cataract   Fall    GERD (gastroesophageal reflux disease)    Hyperlipemia    Hypertension    Osteoarthritis    Thyroid disease    Past Surgical History:  Procedure Laterality Date   ABDOMINAL HYSTERECTOMY  1978   BREAST LUMPECTOMY  1980   SALIVARY STONE REMOVAL  1980's   TONSILLECTOMY  1950's   Social History:   reports that she has never smoked. She has never used smokeless tobacco. She reports that she does not drink alcohol and does not use drugs.  Family History  Problem Relation Age of Onset   Hypertension Mother    Heart disease Mother    Heart attack Father 97   Heart disease Father    Heart attack Sister    Arthritis Sister    Diabetes Brother     Medications: Patient's Medications  New Prescriptions   No medications  on file  Previous Medications   ACETAMINOPHEN (TYLENOL) 500 MG TABLET    Take 500 mg by mouth 2 (two) times daily.   ASPIRIN EC 81 MG TABLET    Take 81 mg by mouth daily.   CHOLECALCIFEROL (VITAMIN D3) 2000 UNITS TABS    Take 1 tablet by mouth daily.   DICYCLOMINE (BENTYL) 20 MG TABLET    Take 1 tablet (20 mg total) by mouth 2 (two) times daily.   DULOXETINE (CYMBALTA) 60 MG CAPSULE    TAKE 1 CAPSULE EVERY DAY   ESOMEPRAZOLE (NEXIUM) 40 MG CAPSULE    Take 1 capsule (40 mg total) by mouth daily at 12 noon.   LISINOPRIL-HYDROCHLOROTHIAZIDE (ZESTORETIC) 10-12.5 MG TABLET     TAKE 1 TABLET EVERY DAY   LOPERAMIDE (IMODIUM A-D) 2 MG TABLET    Take 1 tablet (2 mg total) by mouth 3 (three) times daily as needed for diarrhea or loose stools (for diarrhea).   MELOXICAM (MOBIC) 7.5 MG TABLET    TAKE 1 TABLET(7.5 MG) BY MOUTH DAILY AS NEEDED FOR PAIN   METOPROLOL SUCCINATE (TOPROL-XL) 50 MG 24 HR TABLET    TAKE 1 TABLET EVERY DAY WITH OR IMMEDIATELY FOLLOWING A MEAL.   PROBIOTIC PRODUCT ([place]) CAPS    Take 1 capsule by mouth daily at 12 noon.   ROSUVASTATIN (CRESTOR) 5 MG TABLET    TAKE 1 TABLET THREE TIMES WEEKLY  Modified Medications   No medications on file  Discontinued Medications   CELECOXIB (CELEBREX PO)    Take 1 tablet by mouth every morning.   GLUCOSAMINE-CHONDROITIN 500-400 MG TABLET    Take 1 tablet by mouth 2 (two) times daily.    KRILL OIL (OMEGA-3) 500 MG CAPS    Take 1 capsule by mouth 2 (two) times daily.    PROBIOTIC PRODUCT (PROBIOTIC DAILY PO)    Take 1 tablet by mouth in the morning and at bedtime.    TURMERIC CURCUMIN 500 MG CAPS    Take 1 capsule by mouth 2 (two) times daily.     Physical Exam:  Vitals:   05/27/21 1059  BP: 134/88  Pulse: 62  Temp: (!) 97.3 F (36.3 C)  TempSrc: Temporal  SpO2: 97%  Weight: 208 lb (94.3 kg)  Height: 5' 4"  (1.626 m)   Body mass index is 35.7 kg/m. Wt Readings from Last 3 Encounters:  05/27/21 208 lb (94.3 kg)  03/27/21 206 lb (93.4 kg)  11/21/20 205 lb (93 kg)    Physical Exam Constitutional:      General: She is not in acute distress.    Appearance: She is well-developed. She is not diaphoretic.  HENT:     Head: Normocephalic and atraumatic.     Mouth/Throat:     Pharynx: No oropharyngeal exudate.  Eyes:     Conjunctiva/sclera: Conjunctivae normal.     Pupils: Pupils are equal, round, and reactive to light.  Cardiovascular:     Rate and Rhythm: Normal rate and regular rhythm.     Heart sounds: Normal heart sounds.  Pulmonary:     Effort: Pulmonary effort is normal.      Breath sounds: Normal breath sounds.  Abdominal:     General: Bowel sounds are normal.     Palpations: Abdomen is soft.  Musculoskeletal:     Cervical back: Normal range of motion and neck supple.     Right lower leg: No edema.     Left lower leg: No edema.  Skin:  General: Skin is warm and dry.  Neurological:     Mental Status: She is alert.  Psychiatric:        Mood and Affect: Mood normal.    Labs reviewed: Basic Metabolic Panel: Recent Labs    06/01/20 1219 11/21/20 1132 05/22/21 0953  NA 142 140 140  K 5.1 4.7 5.4*  CL 104 105 105  CO2 29 25 32  GLUCOSE 102* 104* 118  BUN 18 17 21   CREATININE 0.67 0.74 0.75  CALCIUM 10.0 9.7 9.7  TSH  --  1.28  --    Liver Function Tests: Recent Labs    06/01/20 1219 11/21/20 1132 05/22/21 0953  AST 18 16 14   ALT 14 10 14   BILITOT 0.6 0.5 0.5  PROT 6.8 6.6 6.4   No results for input(s): LIPASE, AMYLASE in the last 8760 hours. No results for input(s): AMMONIA in the last 8760 hours. CBC: Recent Labs    11/21/20 1132 05/22/21 0953  WBC 6.1 8.1  NEUTROABS 1,726 3,386  HGB 13.6 14.6  HCT 40.8 44.2  MCV 91.1 91.5  PLT 284 279   Lipid Panel: Recent Labs    06/01/20 1219 11/21/20 1132  CHOL 188 193  HDL 57 62  LDLCALC 97 101*  TRIG 216* 181*  CHOLHDL 3.3 3.1   TSH: Recent Labs    11/21/20 1132  TSH 1.28   A1C: Lab Results  Component Value Date   HGBA1C 5.4 11/21/2020     Assessment/Plan 1. Hyperkalemia Noted on last lab, will recheck today.  - BASIC METABOLIC PANEL WITH GFR  2. Primary osteoarthritis involving multiple joints -doing much better on mobic, able to move around more without being in severe pain. Has improved quality of life.   3. Essential hypertension --stable. Goal bp <140/90. Continue on current regimen with low sodium diet.  - CBC with Differential/Platelet; Future - CMP with eGFR([facility]); Future  4. Morbid obesity ([place]) --education provided on healthy weight loss through  increase in physical activity and proper nutrition   5. Gastroesophageal reflux disease without esophagitis -controlled on Nexium.   6. Irritable bowel syndrome with diarrhea -well controlled on current regimen.   7. Hyperglycemia -dietary modifications encouraged, will follow up A1c with next blood work.  - Hemoglobin A1c; Future  8. Mixed hyperlipidemia -continue current regimen, to work on dietary modifications. - Lipid panel; Future  9. Recurrent depression ([place]) -ongoing, does not feel like she needs ongoing medication, encourage lifestyle modifications.    Return in about 6 months (around 11/24/2021) for routine follow up, labs prior to visit. Carlos American. [place], [facility] [phone]

## 2021-05-28 ENCOUNTER — Other Ambulatory Visit: Payer: Self-pay | Admitting: *Deleted

## 2021-05-28 DIAGNOSIS — F32A Depression, unspecified: Secondary | ICD-10-CM

## 2021-05-28 DIAGNOSIS — F419 Anxiety disorder, unspecified: Secondary | ICD-10-CM

## 2021-05-28 MED ORDER — DULOXETINE HCL 60 MG PO CPEP: 60.0000 mg | ORAL_CAPSULE | Freq: Every day | ORAL | 1 refills | Status: DC

## 2021-05-28 NOTE — Telephone Encounter (Signed)
[facility] requested refill.  

## 2021-06-10 ENCOUNTER — Other Ambulatory Visit: Payer: Self-pay | Admitting: Nurse Practitioner

## 2021-07-03 ENCOUNTER — Other Ambulatory Visit: Payer: Self-pay | Admitting: Nurse Practitioner

## 2021-07-03 DIAGNOSIS — E782 Mixed hyperlipidemia: Secondary | ICD-10-CM

## 2021-07-03 DIAGNOSIS — I10 Essential (primary) hypertension: Secondary | ICD-10-CM

## 2021-08-15 ENCOUNTER — Ambulatory Visit (INDEPENDENT_AMBULATORY_CARE_PROVIDER_SITE_OTHER): Payer: Medicare HMO | Admitting: Nurse Practitioner

## 2021-08-15 ENCOUNTER — Encounter: Payer: Self-pay | Admitting: Nurse Practitioner

## 2021-08-15 DIAGNOSIS — Z0000 Encounter for general adult medical examination without abnormal findings: Secondary | ICD-10-CM | POA: Diagnosis not present

## 2021-08-15 NOTE — Progress Notes (Signed)
This service is provided via telemedicine ? ?No vital signs collected/recorded due to the encounter was a telemedicine visit.  ? ?Location of patient (ex: home, work):  Home ? ?Patient consents to a telephone visit:  Yes, see encounter dated 08/15/2021 ? ?Location of the provider (ex: office, home):  [employer] ? ?Name of any referring provider:  N/A ? ?Names of all persons participating in the telemedicine service and their role in the encounter:  [name], Nurse Practitioner, Elveria Royals, CMA, and patient.  ? ?Time spent on call:  13 minutes with medical assistant ? ?

## 2021-08-15 NOTE — Progress Notes (Signed)
? ?Subjective:  ? April Hill is a 81 y.o. female who presents for Medicare Annual (Subsequent) preventive examination. ? ?Review of Systems    ? ?Cardiac Risk Factors include: advanced age (>61men, >15 women);hypertension;dyslipidemia;sedentary lifestyle;obesity (BMI >30kg/m2) ? ?   ?Objective:  ?  ?There were no vitals filed for this visit. ?There is no height or weight on file to calculate BMI. ? ? ?  08/15/2021  ? 10:59 AM 05/27/2021  ? 10:59 AM 11/21/2020  ? 11:00 AM 08/09/2020  ? 10:06 AM 06/01/2020  ? 11:35 AM 01/30/2020  ? 11:02 AM 12/26/2019  ?  1:21 PM  ?Advanced Directives  ?Does Patient Have a Medical Advance Directive? Yes Yes Yes Yes Yes No;Yes Yes  ?Type of Advance Directive Out of facility DNR (pink MOST or yellow form) Out of facility DNR (pink MOST or yellow form) Out of facility DNR (pink MOST or yellow form) Out of facility DNR (pink MOST or yellow form) Out of facility DNR (pink MOST or yellow form) Out of facility DNR (pink MOST or yellow form) Out of facility DNR (pink MOST or yellow form)  ?Does patient want to make changes to medical advance directive? No - Patient declined No - Patient declined No - Patient declined No - Patient declined No - Patient declined No - Patient declined No - Patient declined  ?Pre-existing out of facility DNR order (yellow form or pink MOST form) Yellow form placed in chart (order not valid for inpatient use) Yellow form placed in chart (order not valid for inpatient use);Pink MOST form placed in chart (order not valid for inpatient use) Yellow form placed in chart (order not valid for inpatient use);Pink MOST form placed in chart (order not valid for inpatient use) Yellow form placed in chart (order not valid for inpatient use);Pink MOST form placed in chart (order not valid for inpatient use) Yellow form placed in chart (order not valid for inpatient use);Pink MOST form placed in chart (order not valid for inpatient use) Yellow form placed in chart (order not valid for  inpatient use);Pink MOST form placed in chart (order not valid for inpatient use) Yellow form placed in chart (order not valid for inpatient use);Pink MOST form placed in chart (order not valid for inpatient use)  ? ? ?Current Medications (verified) ?Outpatient Encounter Medications as of 08/15/2021  ?Medication Sig  ? acetaminophen (TYLENOL) 500 MG tablet Take 500 mg by mouth 2 (two) times daily.  ? aspirin EC 81 MG tablet Take 81 mg by mouth daily.  ? Cholecalciferol (VITAMIN D3) 2000 units TABS Take 1 tablet by mouth daily.  ? dicyclomine (BENTYL) 20 MG tablet TAKE 1 TABLET TWICE DAILY  ? DULoxetine (CYMBALTA) 60 MG capsule Take 1 capsule (60 mg total) by mouth daily.  ? esomeprazole (NEXIUM) 40 MG capsule Take 1 capsule (40 mg total) by mouth daily at 12 noon.  ? lisinopril-hydrochlorothiazide (ZESTORETIC) 10-12.5 MG tablet TAKE 1 TABLET EVERY DAY  ? loperamide (IMODIUM A-D) 2 MG tablet Take 1 tablet (2 mg total) by mouth 3 (three) times daily as needed for diarrhea or loose stools (for diarrhea).  ? meloxicam (MOBIC) 7.5 MG tablet TAKE 1 TABLET(7.5 MG) BY MOUTH DAILY AS NEEDED FOR PAIN  ? metoprolol succinate (TOPROL-XL) 50 MG 24 hr tablet TAKE 1 TABLET EVERY DAY WITH OR IMMEDIATELY FOLLOWING A MEAL.  ? Probiotic Product ([place]) CAPS Take 1 capsule by mouth daily at 12 noon.  ? rosuvastatin (CRESTOR) 5 MG tablet TAKE 1 TABLET  THREE TIMES WEEKLY  ? ?No facility-administered encounter medications on file as of 08/15/2021.  ? ? ?Allergies (verified) ?Amoxicillin  ? ?History: ?Past Medical History:  ?Diagnosis Date  ? Anxiety and depression   ? Cataract   ? Eye abnormality   ? film over eye that was not cataract  ? Fall   ? GERD (gastroesophageal reflux disease)   ? Hyperlipemia   ? Hypertension   ? Osteoarthritis   ? Thyroid disease   ? ?Past Surgical History:  ?Procedure Laterality Date  ? ABDOMINAL HYSTERECTOMY  1978  ? BREAST LUMPECTOMY  1980  ? SALIVARY STONE REMOVAL  1980's  ? TONSILLECTOMY   1950's  ? ?Family History  ?Problem Relation Age of Onset  ? Hypertension Mother   ? Heart disease Mother   ? Heart attack Father 60  ? Heart disease Father   ? Heart attack Sister   ? Arthritis Sister   ? Diabetes Brother   ? ?Social History  ? ?Socioeconomic History  ? Marital status: Married  ?  Spouse name: Not on file  ? Number of children: Not on file  ? Years of education: Not on file  ? Highest education level: Not on file  ?Occupational History  ? Not on file  ?Tobacco Use  ? Smoking status: Never  ? Smokeless tobacco: Never  ?Vaping Use  ? Vaping Use: Never used  ?Substance and Sexual Activity  ? Alcohol use: No  ? Drug use: No  ? Sexual activity: Not Currently  ?Other Topics Concern  ? Not on file  ?Social History Narrative  ? Social History  ?   ? Diet? Off and on  ?   ? Do you drink/eat things with caffeine? yes  ?   ? Marital status?  married                                  What year were you married? 1960  ?   ? Do you live in a house, apartment, assisted living, condo, trailer, etc.? House with daughter  ?   ? Is it one or more stories? 2-we live in basement apt  ?   ? How many persons live in your home? 4  ?   ? Do you have any pets in your home? (please list) cat  ?   ? Highest level of education completed? High school  ?   ? Current or past profession: Network engineer- cashier-hostess  ?   ? Do you exercise?         no                             Type & how often? --  ?   ? Advanced Directives  ?   ? Do you have a living will? yes  ?   ? Do you have a DNR form?       yes                            If not, do you want to discuss one?  ?   ? Do you have signed POA/HPOA for forms? yes  ?   ? Functional Status  ?   ? Do you have difficulty bathing or dressing yourself? no  ?   ? Do you have difficulty preparing  food or eating? no  ?   ? Do you have difficulty managing your medications? no  ?   ? Do you have difficulty managing your finances? no  ?   ? Do you have difficulty affording your medications? no   ? ?Social Determinants of Health  ? ?Financial Resource Strain: Not on file  ?Food Insecurity: Not on file  ?Transportation Needs: Not on file  ?Physical Activity: Not on file  ?Stress: Not on file  ?Social Connections: Not on file  ? ? ?Tobacco Counseling ?Counseling given: Not Answered ? ? ?Clinical Intake: ? ?Pre-visit preparation completed: Yes ? ?Pain : 0-10 ?Pain Type: Chronic pain ?Pain Location: Knee ?Pain Orientation: Right, Left ?Pain Onset: More than a month ago ?Pain Frequency: Intermittent ?Pain Relieving Factors: sittting and medication ? ?Pain Relieving Factors: sittting and medication ? ?BMI - recorded: 35 ?Nutritional Status: BMI > 30  Obese ? ?How often do you need to have someone help you when you read instructions, pamphlets, or other written materials from your doctor or pharmacy?: 1 - Never ? ?Diabetic?no ? ?  ? ?  ? ? ?Activities of Daily Living ? ?  08/15/2021  ? 11:17 AM  ?In your present state of health, do you have any difficulty performing the following activities:  ?Hearing? 1  ?Vision? 0  ?Difficulty concentrating or making decisions? 1  ?Comment some difficulty remembering  ?Walking or climbing stairs? 0  ?Dressing or bathing? 0  ?Doing errands, shopping? 0  ?Preparing Food and eating ? N  ?Using the Toilet? N  ?In the past six months, have you accidently leaked urine? Y  ?Do you have problems with loss of bowel control? Y  ?Managing your Medications? N  ?Managing your Finances? N  ?Housekeeping or managing your Housekeeping? N  ? ? ?Patient Care Team: ?Lauree Chandler, NP as PCP - General (Geriatric Medicine) ?Rutherford Guys, MD as Consulting Physician (Ophthalmology) ? ?Indicate any recent Medical Services you may have received from other than [facility] providers in the past year (date may be approximate). ? ?   ?Assessment:  ? This is a routine wellness examination for April Hill. ? ?Hearing/Vision screen ?Hearing Screening - Comments:: Patient has some hearing loss,but doesn't wear hearing  aids. ?Vision Screening - Comments:: Patient wears glasses. Patient has had yearly eye exam. Patient sees Dr. Gershon Crane. ? ?Dietary issues and exercise activities discussed: ?Current Exercise Habits: The p

## 2021-08-15 NOTE — Patient Instructions (Signed)
Ms. Fuhrman , ?Thank you for taking time to come for your Medicare Wellness Visit. I appreciate your ongoing commitment to your health goals. Please review the following plan we discussed and let me know if I can assist you in the future.  ? ?Screening recommendations/referrals: ?Colonoscopy aged out  ?Mammogram aged out ?Bone Density declined ?Recommended yearly ophthalmology/optometry visit for glaucoma screening and checkup ?Recommended yearly dental visit for hygiene and checkup ? ?Vaccinations: ?Influenza vaccine up to date ?Pneumococcal vaccine up to date ?Tdap vaccine up to date ?Shingles vaccine  up to date   ? ?Advanced directives: on file.  ? ?Conditions/risks identified: advance age, obesity, hyperlipidemia, hypertension.  ? ?Next appointment: yearly  ? ? ?Preventive Care 66 Years and Older, Female ?Preventive care refers to lifestyle choices and visits with your health care provider that can promote health and wellness. ?What does preventive care include? ?A yearly physical exam. This is also called an annual well check. ?Dental exams once or twice a year. ?Routine eye exams. Ask your health care provider how often you should have your eyes checked. ?Personal lifestyle choices, including: ?Daily care of your teeth and gums. ?Regular physical activity. ?Eating a healthy diet. ?Avoiding tobacco and drug use. ?Limiting alcohol use. ?Practicing safe sex. ?Taking low-dose aspirin every day. ?Taking vitamin and mineral supplements as recommended by your health care provider. ?What happens during an annual well check? ?The services and screenings done by your health care provider during your annual well check will depend on your age, overall health, lifestyle risk factors, and family history of disease. ?Counseling  ?Your health care provider may ask you questions about your: ?Alcohol use. ?Tobacco use. ?Drug use. ?Emotional well-being. ?Home and relationship well-being. ?Sexual activity. ?Eating habits. ?History  of falls. ?Memory and ability to understand (cognition). ?Work and work Astronomer. ?Reproductive health. ?Screening  ?You may have the following tests or measurements: ?Height, weight, and BMI. ?Blood pressure. ?Lipid and cholesterol levels. These may be checked every 5 years, or more frequently if you are over 45 years old. ?Skin check. ?Lung cancer screening. You may have this screening every year starting at age 28 if you have a 30-pack-year history of smoking and currently smoke or have quit within the past 15 years. ?Fecal occult blood test (FOBT) of the stool. You may have this test every year starting at age 39. ?Flexible sigmoidoscopy or colonoscopy. You may have a sigmoidoscopy every 5 years or a colonoscopy every 10 years starting at age 7. ?Hepatitis C blood test. ?Hepatitis B blood test. ?Sexually transmitted disease (STD) testing. ?Diabetes screening. This is done by checking your blood sugar (glucose) after you have not eaten for a while (fasting). You may have this done every 1-3 years. ?Bone density scan. This is done to screen for osteoporosis. You may have this done starting at age 59. ?Mammogram. This may be done every 1-2 years. Talk to your health care provider about how often you should have regular mammograms. ?Talk with your health care provider about your test results, treatment options, and if necessary, the need for more tests. ?Vaccines  ?Your health care provider may recommend certain vaccines, such as: ?Influenza vaccine. This is recommended every year. ?Tetanus, diphtheria, and acellular pertussis (Tdap, Td) vaccine. You may need a Td booster every 10 years. ?Zoster vaccine. You may need this after age 72. ?Pneumococcal 13-valent conjugate (PCV13) vaccine. One dose is recommended after age 45. ?Pneumococcal polysaccharide (PPSV23) vaccine. One dose is recommended after age 71. ?Talk to your  health care provider about which screenings and vaccines you need and how often you need  them. ?This information is not intended to replace advice given to you by your health care provider. Make sure you discuss any questions you have with your health care provider. ?Document Released: 04/20/2015 Document Revised: 12/12/2015 Document Reviewed: 01/23/2015 ?Elsevier Interactive Patient Education ? 2017 [employer]. ? ?Fall Prevention in the Home ?Falls can cause injuries. They can happen to people of all ages. There are many things you can do to make your home safe and to help prevent falls. ?What can I do on the outside of my home? ?Regularly fix the edges of walkways and driveways and fix any cracks. ?Remove anything that might make you trip as you walk through a door, such as a raised step or threshold. ?Trim any bushes or trees on the path to your home. ?Use bright outdoor lighting. ?Clear any walking paths of anything that might make someone trip, such as rocks or tools. ?Regularly check to see if handrails are loose or broken. Make sure that both sides of any steps have handrails. ?Any raised decks and porches should have guardrails on the edges. ?Have any leaves, snow, or ice cleared regularly. ?Use sand or salt on walking paths during winter. ?Clean up any spills in your garage right away. This includes oil or grease spills. ?What can I do in the bathroom? ?Use night lights. ?Install grab bars by the toilet and in the tub and shower. Do not use towel bars as grab bars. ?Use non-skid mats or decals in the tub or shower. ?If you need to sit down in the shower, use a plastic, non-slip stool. ?Keep the floor dry. Clean up any water that spills on the floor as soon as it happens. ?Remove soap buildup in the tub or shower regularly. ?Attach bath mats securely with double-sided non-slip rug tape. ?Do not have throw rugs and other things on the floor that can make you trip. ?What can I do in the bedroom? ?Use night lights. ?Make sure that you have a light by your bed that is easy to reach. ?Do not use  any sheets or blankets that are too big for your bed. They should not hang down onto the floor. ?Have a firm chair that has side arms. You can use this for support while you get dressed. ?Do not have throw rugs and other things on the floor that can make you trip. ?What can I do in the kitchen? ?Clean up any spills right away. ?Avoid walking on wet floors. ?Keep items that you use a lot in easy-to-reach places. ?If you need to reach something above you, use a strong step stool that has a grab bar. ?Keep electrical cords out of the way. ?Do not use floor polish or wax that makes floors slippery. If you must use wax, use non-skid floor wax. ?Do not have throw rugs and other things on the floor that can make you trip. ?What can I do with my stairs? ?Do not leave any items on the stairs. ?Make sure that there are handrails on both sides of the stairs and use them. Fix handrails that are broken or loose. Make sure that handrails are as long as the stairways. ?Check any carpeting to make sure that it is firmly attached to the stairs. Fix any carpet that is loose or worn. ?Avoid having throw rugs at the top or bottom of the stairs. If you do have throw rugs, attach them  to the floor with carpet tape. ?Make sure that you have a light switch at the top of the stairs and the bottom of the stairs. If you do not have them, ask someone to add them for you. ?What else can I do to help prevent falls? ?Wear shoes that: ?Do not have high heels. ?Have rubber bottoms. ?Are comfortable and fit you well. ?Are closed at the toe. Do not wear sandals. ?If you use a stepladder: ?Make sure that it is fully opened. Do not climb a closed stepladder. ?Make sure that both sides of the stepladder are locked into place. ?Ask someone to hold it for you, if possible. ?Clearly mark and make sure that you can see: ?Any grab bars or handrails. ?First and last steps. ?Where the edge of each step is. ?Use tools that help you move around (mobility aids)  if they are needed. These include: ?Canes. ?Walkers. ?Scooters. ?Crutches. ?Turn on the lights when you go into a dark area. Replace any light bulbs as soon as they burn out. ?Set up your furniture so you ha

## 2021-10-15 ENCOUNTER — Other Ambulatory Visit: Payer: Self-pay | Admitting: Nurse Practitioner

## 2021-10-15 DIAGNOSIS — M159 Polyosteoarthritis, unspecified: Secondary | ICD-10-CM

## 2021-10-21 ENCOUNTER — Other Ambulatory Visit: Payer: Self-pay | Admitting: Nurse Practitioner

## 2021-10-21 DIAGNOSIS — M159 Polyosteoarthritis, unspecified: Secondary | ICD-10-CM

## 2021-10-25 ENCOUNTER — Ambulatory Visit: Payer: Medicare HMO | Admitting: Physician Assistant

## 2021-10-25 ENCOUNTER — Encounter: Payer: Self-pay | Admitting: Physician Assistant

## 2021-10-25 DIAGNOSIS — M170 Bilateral primary osteoarthritis of knee: Secondary | ICD-10-CM | POA: Diagnosis not present

## 2021-10-25 MED ORDER — METHYLPREDNISOLONE ACETATE 40 MG/ML IJ SUSP
80.0000 mg | INTRAMUSCULAR | Status: AC | PRN
  Administered 2021-10-25: 80 mg via INTRA_ARTICULAR

## 2021-10-25 MED ORDER — LIDOCAINE HCL 1 % IJ SOLN
2.0000 mL | INTRAMUSCULAR | Status: AC | PRN
  Administered 2021-10-25: 2 mL

## 2021-10-25 MED ORDER — BUPIVACAINE HCL 0.25 % IJ SOLN
2.0000 mL | INTRAMUSCULAR | Status: AC | PRN
  Administered 2021-10-25: 2 mL via INTRA_ARTICULAR

## 2021-10-25 NOTE — Progress Notes (Signed)
Office Visit Note   Patient: April Hill           Date of Birth: Feb 29, 1941           MRN: [id number] Visit Date: 10/25/2021              Requested by: [name], NP [address] [place]. [place],  [place] [address] PCP: [name], NP  Chief Complaint  Patient presents with  . Left Knee - Pain  . Right Knee - Pain      HPI: Patient is a very pleasant 81 year old woman who is a former patient of Dr. Prince Rome.  She has a history of bilateral knee arthritis and left shoulder arthritis.  She states that Dr. Prince Rome has injected all 3 joints for her in the past and she is planning a trip out of state in a couple weeks and was wondering if she could have injections today.  Assessment & Plan: Visit Diagnoses:  1. Primary osteoarthritis of both knees     Plan: I explained to the patient and her daughter that I do not feel comfortable injecting 3 joints I just feel it is too much steroid.  I we will inject her knees today.  She can follow-up with late next week right before her trip for an injection into her left shoulder.  She is not diabetic  Follow-Up Instructions:   Ortho Exam  Patient is alert, oriented, no adenopathy, well-dressed, normal affect, normal respiratory effort. Bilateral knees no warmth no effusion she has tenderness in grinding with range of motion.  Previous x-rays demonstrate tricompartmental arthritis  Imaging: No results found. No images are attached to the encounter.  Labs: Lab Results  Component Value Date   HGBA1C 5.4 11/21/2020   HGBA1C 6.2 (H) 06/01/2020   HGBA1C 6.4 (H) 01/11/2020     No results found for: "ALBUMIN", "PREALBUMIN", "CBC"  No results found for: "MG" No results found for: "VD25OH"  No results found for: "PREALBUMIN"    Latest Ref Rng & Units 05/22/2021    9:53 AM 11/21/2020   11:32 AM 01/11/2020    8:11 AM  CBC EXTENDED  WBC 3.8 - 10.8 Thousand/uL 8.1  6.1  9.0   RBC 3.80 - 5.10 Million/uL 4.83  4.48  4.86    Hemoglobin 11.7 - 15.5 g/dL 27.7  41.2  87.8   HCT 35.0 - 45.0 % 44.2  40.8  44.4   Platelets 140 - 400 Thousand/uL 279  284  303   NEUT# 1,500 - 7,800 cells/uL 3,386  1,726  4,392   Lymph# 850 - 3,900 cells/uL 3,767  3,648  3,744      There is no height or weight on file to calculate BMI.  Orders:  No orders of the defined types were placed in this encounter.  No orders of the defined types were placed in this encounter.    Procedures: Large Joint Inj: bilateral knee on 10/25/2021 3:37 PM Indications: pain and diagnostic evaluation Details: 25 G 1.5 in needle, anteromedial approach  Arthrogram: No  Medications (Right): 2 mL lidocaine 1 %; 2 mL bupivacaine 0.25 %; 80 mg methylPREDNISolone acetate 40 MG/ML Medications (Left): 2 mL lidocaine 1 %; 2 mL bupivacaine 0.25 %; 80 mg methylPREDNISolone acetate 40 MG/ML Outcome: tolerated well, no immediate complications Procedure, treatment alternatives, risks and benefits explained, specific risks discussed. Consent was given by the patient.    Clinical Data: No additional findings.  ROS:  All other systems negative, except  as noted in the HPI. Review of Systems  Objective: Vital Signs: There were no vitals taken for this visit.  Specialty Comments:  No specialty comments available.  PMFS History: Patient Active Problem List   Diagnosis Date Noted  . Osteoarthritis of knees, bilateral 10/25/2021  . Elevated alkaline phosphatase level 09/22/2017  . Hypertension   . Hyperlipemia   . Anxiety and depression   . Osteoarthritis   . Irritable bowel syndrome with diarrhea 11/05/2016  . History of colon polyps 11/05/2016  . Abnormal nuclear stress test 08/05/2013  . Vitamin D deficiency 03/11/2011  . Obesity 03/11/2011  . Gastro-esophageal reflux disease without esophagitis 03/11/2011  . Abnormal glucose 03/11/2011   Past Medical History:  Diagnosis Date  . Anxiety and depression   . Cataract   . Eye abnormality     film over eye that was not cataract  . Fall   . GERD (gastroesophageal reflux disease)   . Hyperlipemia   . Hypertension   . Osteoarthritis   . Thyroid disease     Family History  Problem Relation Age of Onset  . Hypertension Mother   . Heart disease Mother   . Heart attack Father 48  . Heart disease Father   . Heart attack Sister   . Arthritis Sister   . Diabetes Brother     Past Surgical History:  Procedure Laterality Date  . ABDOMINAL HYSTERECTOMY  1978  . BREAST LUMPECTOMY  1980  . SALIVARY STONE REMOVAL  1980's  . TONSILLECTOMY  1950's   Social History   Occupational History  . Not on file  Tobacco Use  . Smoking status: Never  . Smokeless tobacco: Never  Vaping Use  . Vaping Use: Never used  Substance and Sexual Activity  . Alcohol use: No  . Drug use: No  . Sexual activity: Not Currently

## 2021-10-28 ENCOUNTER — Other Ambulatory Visit: Payer: Self-pay | Admitting: *Deleted

## 2021-10-28 DIAGNOSIS — M159 Polyosteoarthritis, unspecified: Secondary | ICD-10-CM

## 2021-10-28 MED ORDER — MELOXICAM 7.5 MG PO TABS: ORAL_TABLET | ORAL | 5 refills | Status: DC

## 2021-10-28 NOTE — Telephone Encounter (Signed)
Patient requested refill

## 2021-10-31 ENCOUNTER — Encounter: Payer: Self-pay | Admitting: Orthopaedic Surgery

## 2021-10-31 ENCOUNTER — Ambulatory Visit: Payer: Medicare HMO | Admitting: Orthopaedic Surgery

## 2021-10-31 DIAGNOSIS — M19012 Primary osteoarthritis, left shoulder: Secondary | ICD-10-CM

## 2021-10-31 DIAGNOSIS — M170 Bilateral primary osteoarthritis of knee: Secondary | ICD-10-CM | POA: Diagnosis not present

## 2021-10-31 MED ORDER — LIDOCAINE HCL 1 % IJ SOLN
2.0000 mL | INTRAMUSCULAR | Status: AC | PRN
  Administered 2021-10-31: 2 mL

## 2021-10-31 MED ORDER — BUPIVACAINE HCL 0.25 % IJ SOLN
2.0000 mL | INTRAMUSCULAR | Status: AC | PRN
  Administered 2021-10-31: 2 mL via INTRA_ARTICULAR

## 2021-10-31 MED ORDER — METHYLPREDNISOLONE ACETATE 40 MG/ML IJ SUSP
80.0000 mg | INTRAMUSCULAR | Status: AC | PRN
  Administered 2021-10-31: 80 mg via INTRA_ARTICULAR

## 2021-10-31 NOTE — Progress Notes (Signed)
Office Visit Note   Patient: April Hill           Date of Birth: [birth year]           MRN: [id number] Visit Date: 10/31/2021              Requested by: [name], NP [address] [place]. [place],  [place] [address] PCP: [name], NP   Assessment & Plan: Visit Diagnoses:  1. Primary osteoarthritis of both knees   2. Primary osteoarthritis of left shoulder     Plan: Patient is a pleasant 81 year old woman who is 1 week status post bilateral steroid injections into her knees.  She has a history of osteoarthritis of both of her knees and has done well with injections before.  She also requested at the time to have her left shoulder injected but it was explained to her that it would just be too much steroid load at one time.  She comes in today accompanied by her daughter.  The injections into the knee helped but were short-lived.  She also had some flushing following the injections.  Because of this we recommend going forward with authorization of viscosupplementation for her bilateral knees.  We will go forward with injecting her shoulder today.  Prior films demonstrated what appears to be primary osteoarthritis with some narrowing of the joint space and inferior humeral head osteophyte formation.  On the right there is evidence of rotator cuff arthropathy but she is doing well This patient is diagnosed with osteoarthritis of the knee(s).    Radiographs show evidence of joint space narrowing, osteophytes, subchondral sclerosis and/or subchondral cysts.  This patient has knee pain which interferes with functional and activities of daily living.    This patient has experienced inadequate response, adverse effects and/or intolerance with conservative treatments such as acetaminophen, NSAIDS, topical creams, physical therapy or regular exercise, knee bracing and/or weight loss.   This patient has experienced inadequate response or has a contraindication to intra articular steroid  injections for at least 3 months.   This patient is not scheduled to have a total knee replacement within 6 months of starting treatment with viscosupplementation.  Follow-Up Instructions: When viscosupplementation approved  Orders:  Orders Placed This Encounter  Procedures   Large Joint Inj: L glenohumeral   No orders of the defined types were placed in this encounter.     Procedures: Large Joint Inj: L glenohumeral on 10/31/2021 3:03 PM Indications: pain and diagnostic evaluation Details: 25 G 1.5 in needle, anterior approach  Arthrogram: No  Medications: 80 mg methylPREDNISolone acetate 40 MG/ML; 2 mL lidocaine 1 %; 2 mL bupivacaine 0.25 % Outcome: tolerated well, no immediate complications Procedure, treatment alternatives, risks and benefits explained, specific risks discussed. Consent was given by the patient.      Clinical Data: No additional findings.   Subjective: Chief Complaint  Patient presents with   Left Shoulder - Follow-up   Patient presents today for follow up of her left shoulder pain. She states that she has had previous shoulder injection in the past and has good relief with them.Patient denies having any injuries or falls to have started the flare up. She reports that she has not had any previous should surgeries. Today she is requesting a injection.  Review of Systems  All other systems reviewed and are negative.    Objective: Vital Signs: There were no vitals taken for this visit.  Physical Exam Constitutional:      Appearance:  Normal appearance.  Pulmonary:     Effort: Pulmonary effort is normal.  Neurological:     General: No focal deficit present.     Mental Status: She is alert.     Ortho Exam Left shoulder: She has limited external rotation and forward elevation.  Forward elevation secondary to pain.  No redness no erythema grip strength intact sensation intact.  Some crepitation Bilateral knees no effusion no redness no erythema.   Crepitus with range of motion Specialty Comments:  No specialty comments available.  Imaging: No results found.   PMFS History: Patient Active Problem List   Diagnosis Date Noted   Degenerative arthritis of left shoulder region 10/31/2021   Osteoarthritis of knees, bilateral 10/25/2021   Elevated alkaline phosphatase level 09/22/2017   Hypertension    Hyperlipemia    Anxiety and depression    Osteoarthritis    Irritable bowel syndrome with diarrhea 11/05/2016   History of colon polyps 11/05/2016   Abnormal nuclear stress test 08/05/2013   Vitamin D deficiency 03/11/2011   Obesity 03/11/2011   Gastro-esophageal reflux disease without esophagitis 03/11/2011   Abnormal glucose 03/11/2011   Past Medical History:  Diagnosis Date   Anxiety and depression    Cataract    Eye abnormality    film over eye that was not cataract   Fall    GERD (gastroesophageal reflux disease)    Hyperlipemia    Hypertension    Osteoarthritis    Thyroid disease     Family History  Problem Relation Age of Onset   Hypertension Mother    Heart disease Mother    Heart attack Father 18   Heart disease Father    Heart attack Sister    Arthritis Sister    Diabetes Brother     Past Surgical History:  Procedure Laterality Date   ABDOMINAL HYSTERECTOMY  1978   BREAST LUMPECTOMY  1980   SALIVARY STONE REMOVAL  1980's   TONSILLECTOMY  1950's   Social History   Occupational History   Not on file  Tobacco Use   Smoking status: Never   Smokeless tobacco: Never  Vaping Use   Vaping Use: Never used  Substance and Sexual Activity   Alcohol use: No   Drug use: No   Sexual activity: Not Currently

## 2021-11-07 ENCOUNTER — Telehealth: Payer: Self-pay

## 2021-11-07 NOTE — Telephone Encounter (Signed)
VOB submitted for Orthovisc, bilateral knee 

## 2021-11-07 NOTE — Telephone Encounter (Signed)
-----   Message from Roxan Hockey, CMA sent at 10/31/2021  3:00 PM EDT ----- Regarding: gel injection Dr. Cleophas Dunker would like get gel injection approved for bilateral knee please !

## 2021-11-10 ENCOUNTER — Other Ambulatory Visit: Payer: Self-pay | Admitting: Nurse Practitioner

## 2021-11-11 NOTE — Telephone Encounter (Signed)
Patient has request refill on medication Dicyclomine 20mg . Patient last refill dated 06/11/2021. Medication has warnings. Medication pend and sent to PCP 08/11/2021, NP.

## 2021-11-14 ENCOUNTER — Other Ambulatory Visit: Payer: Self-pay | Admitting: Nurse Practitioner

## 2021-11-14 DIAGNOSIS — M159 Polyosteoarthritis, unspecified: Secondary | ICD-10-CM

## 2021-11-15 ENCOUNTER — Ambulatory Visit (INDEPENDENT_AMBULATORY_CARE_PROVIDER_SITE_OTHER): Payer: Medicare HMO | Admitting: Nurse Practitioner

## 2021-11-15 ENCOUNTER — Encounter: Payer: Self-pay | Admitting: Nurse Practitioner

## 2021-11-15 VITALS — BP 130/80 | HR 87 | Temp 98.2°F | Resp 18 | Ht 64.0 in | Wt 197.4 lb

## 2021-11-15 DIAGNOSIS — K219 Gastro-esophageal reflux disease without esophagitis: Secondary | ICD-10-CM

## 2021-11-15 DIAGNOSIS — E6609 Other obesity due to excess calories: Secondary | ICD-10-CM | POA: Diagnosis not present

## 2021-11-15 DIAGNOSIS — K580 Irritable bowel syndrome with diarrhea: Secondary | ICD-10-CM

## 2021-11-15 DIAGNOSIS — I10 Essential (primary) hypertension: Secondary | ICD-10-CM

## 2021-11-15 DIAGNOSIS — R197 Diarrhea, unspecified: Secondary | ICD-10-CM

## 2021-11-15 DIAGNOSIS — Z1152 Encounter for screening for COVID-19: Secondary | ICD-10-CM | POA: Diagnosis not present

## 2021-11-15 DIAGNOSIS — Z6834 Body mass index (BMI) 34.0-34.9, adult: Secondary | ICD-10-CM | POA: Diagnosis not present

## 2021-11-15 DIAGNOSIS — M159 Polyosteoarthritis, unspecified: Secondary | ICD-10-CM

## 2021-11-15 DIAGNOSIS — R0602 Shortness of breath: Secondary | ICD-10-CM | POA: Diagnosis not present

## 2021-11-15 LAB — COMPLETE METABOLIC PANEL WITH GFR
AG Ratio: 1.9 (calc) (ref 1.0–2.5)
ALT: 22 U/L (ref 6–29)
AST: 18 U/L (ref 10–35)
Albumin: 4.2 g/dL (ref 3.6–5.1)
Alkaline phosphatase (APISO): 143 U/L (ref 37–153)
BUN/Creatinine Ratio: 24 (calc) — ABNORMAL HIGH (ref 6–22)
BUN: 23 mg/dL (ref 7–25)
CO2: 25 mmol/L (ref 20–32)
Calcium: 9.8 mg/dL (ref 8.6–10.4)
Chloride: 102 mmol/L (ref 98–110)
Creat: 0.97 mg/dL — ABNORMAL HIGH (ref 0.60–0.95)
Globulin: 2.2 g/dL (calc) (ref 1.9–3.7)
Glucose, Bld: 118 mg/dL — ABNORMAL HIGH (ref 65–99)
Potassium: 4.3 mmol/L (ref 3.5–5.3)
Sodium: 136 mmol/L (ref 135–146)
Total Bilirubin: 0.7 mg/dL (ref 0.2–1.2)
Total Protein: 6.4 g/dL (ref 6.1–8.1)
eGFR: 58 mL/min/{1.73_m2} — ABNORMAL LOW (ref >=60)

## 2021-11-15 LAB — CBC WITH DIFFERENTIAL/PLATELET
Absolute Monocytes: 813 cells/uL (ref 200–950)
Basophils Absolute: 29 cells/uL (ref 0–200)
Basophils Relative: 0.3 %
Eosinophils Absolute: 88 cells/uL (ref 15–500)
Eosinophils Relative: 0.9 %
HCT: 46.8 % — ABNORMAL HIGH (ref 35.0–45.0)
Hemoglobin: 16 g/dL — ABNORMAL HIGH (ref 11.7–15.5)
Lymphs Abs: 2234 cells/uL (ref 850–3900)
MCH: 30.9 pg (ref 27.0–33.0)
MCHC: 34.2 g/dL (ref 32.0–36.0)
MCV: 90.3 fL (ref 80.0–100.0)
MPV: 11.7 fL (ref 7.5–12.5)
Monocytes Relative: 8.3 %
Neutro Abs: 6635 cells/uL (ref 1500–7800)
Neutrophils Relative %: 67.7 %
Platelets: 277 10*3/uL (ref 140–400)
RBC: 5.18 10*6/uL — ABNORMAL HIGH (ref 3.80–5.10)
RDW: 13.1 % (ref 11.0–15.0)
Total Lymphocyte: 22.8 %
WBC: 9.8 10*3/uL (ref 3.8–10.8)

## 2021-11-15 LAB — AMYLASE: Amylase: 21 U/L (ref 21–101)

## 2021-11-15 LAB — LIPASE: Lipase: 13 U/L (ref 7–60)

## 2021-11-15 NOTE — Progress Notes (Signed)
Careteam: Patient Care Team: Lauree Chandler, NP as PCP - General (Geriatric Medicine) Rutherford Guys, MD as Consulting Physician (Ophthalmology)  PLACE OF SERVICE:  [facility]  Advanced Directive information    Allergies  Allergen Reactions   Amoxicillin Diarrhea    Chief Complaint  Patient presents with   Acute Visit    Patient presents today for abdominal pain with diarrhea, nausea SOB and bad acid reflux for about 2 weeks now. She not eating much. Think she may have COVID due to pass experience of COVID and same symptoms.      HPI: Patient is a 81 y.o. female   Has a primary c/o abdominal pain. Describes the pain as severe/sharp and comes and goes. Has constipation at the beginning about two weeks ago, but now has diarrhea. She has had this worked up previously with gastroenterology but the work-up was unremarakable. She has been taking bentyl twice a day. She thinks her GERD is worse as well. Is currently on nexium and taking consistently. Has tried TUMS but it has not helped. States these symptoms are similar to the same symptoms she had the last time that she had COVID-19. Has no respiratory symptoms at this time. Daughter feels like she has shortness of breath but she denies this. Endorses nausea but no vomiting. Pain is not associated with eating. Has tried eliminating dairy and gluten with no results.   Has chronic joint pain but got a cortisone shot in the knees and shoulders about two weeks ago.  Daughter present and is requesting an ekg as she thinks the patient has been more short of breath. Pt denies shortness of breath or chest pains. She is very inactive at baseline.    Review of Systems:  Review of Systems  Constitutional:  Negative for chills, fever and weight loss.  HENT:  Negative for congestion and sore throat.   Eyes:  Negative for blurred vision and pain.  Respiratory:  Negative for cough, shortness of breath and stridor.   Cardiovascular:  Negative  for chest pain, palpitations and leg swelling.  Gastrointestinal:  Positive for abdominal pain, diarrhea, heartburn and nausea. Negative for blood in stool, constipation, melena and vomiting.  Genitourinary:  Negative for hematuria.  Musculoskeletal:  Positive for joint pain.  Skin:  Negative for itching.  Neurological:  Negative for dizziness and headaches.  Psychiatric/Behavioral:  Negative for depression. The patient is not nervous/anxious.     Past Medical History:  Diagnosis Date   Anxiety and depression    Cataract    Eye abnormality    film over eye that was not cataract   Fall    GERD (gastroesophageal reflux disease)    Hyperlipemia    Hypertension    Osteoarthritis    Thyroid disease    Past Surgical History:  Procedure Laterality Date   ABDOMINAL HYSTERECTOMY  1978   BREAST LUMPECTOMY  1980   SALIVARY STONE REMOVAL  1980's   TONSILLECTOMY  1950's   Social History:   reports that she has never smoked. She has never used smokeless tobacco. She reports that she does not drink alcohol and does not use drugs.  Family History  Problem Relation Age of Onset   Hypertension Mother    Heart disease Mother    Heart attack Father 8   Heart disease Father    Heart attack Sister    Arthritis Sister    Diabetes Brother     Medications: Patient's Medications  New Prescriptions  No medications on file  Previous Medications   ACETAMINOPHEN (TYLENOL) 500 MG TABLET    Take 500 mg by mouth 2 (two) times daily.   ASPIRIN EC 81 MG TABLET    Take 81 mg by mouth daily.   CHOLECALCIFEROL (VITAMIN D3) 2000 UNITS TABS    Take 1 tablet by mouth daily.   DICYCLOMINE (BENTYL) 20 MG TABLET    TAKE 1 TABLET TWICE DAILY   DULOXETINE (CYMBALTA) 60 MG CAPSULE    Take 1 capsule (60 mg total) by mouth daily.   ESOMEPRAZOLE (NEXIUM) 40 MG CAPSULE    Take 1 capsule (40 mg total) by mouth daily at 12 noon.   LISINOPRIL-HYDROCHLOROTHIAZIDE (ZESTORETIC) 10-12.5 MG TABLET    TAKE 1 TABLET  EVERY DAY   LOPERAMIDE (IMODIUM A-D) 2 MG TABLET    Take 1 tablet (2 mg total) by mouth 3 (three) times daily as needed for diarrhea or loose stools (for diarrhea).   MELOXICAM (MOBIC) 7.5 MG TABLET    TAKE 1 TABLET(7.5 MG) BY MOUTH DAILY AS NEEDED FOR PAIN   METOPROLOL SUCCINATE (TOPROL-XL) 50 MG 24 HR TABLET    TAKE 1 TABLET EVERY DAY WITH OR IMMEDIATELY FOLLOWING A MEAL.   PROBIOTIC PRODUCT (PHILLIPS COLON HEALTH) CAPS    Take 1 capsule by mouth daily at 12 noon.   ROSUVASTATIN (CRESTOR) 5 MG TABLET    TAKE 1 TABLET THREE TIMES WEEKLY  Modified Medications   No medications on file  Discontinued Medications   No medications on file    Physical Exam:  Vitals:   11/15/21 0936 11/15/21 1000  BP: (!) 140/88 130/80  Pulse: 87   Resp: 18   Temp: 98.2 F (36.8 C)   SpO2: 98%   Weight: 197 lb 6.4 oz (89.5 kg)   Height: 5' 4"  (1.626 m)    Body mass index is 33.88 kg/m. Wt Readings from Last 3 Encounters:  11/15/21 197 lb 6.4 oz (89.5 kg)  05/27/21 208 lb (94.3 kg)  03/27/21 206 lb (93.4 kg)    Physical Exam Constitutional:      General: She is not in acute distress. HENT:     Right Ear: Tympanic membrane and external ear normal. There is no impacted cerumen.     Left Ear: Tympanic membrane, ear canal and external ear normal. There is no impacted cerumen.  Eyes:     Pupils: Pupils are equal, round, and reactive to light.  Cardiovascular:     Rate and Rhythm: Normal rate and regular rhythm.     Pulses: Normal pulses.     Heart sounds: Normal heart sounds. No murmur heard. Pulmonary:     Effort: Pulmonary effort is normal. No respiratory distress.     Breath sounds: Normal breath sounds. No wheezing.  Abdominal:     General: Abdomen is flat. Bowel sounds are normal. There is no distension.     Palpations: Abdomen is soft.     Tenderness: There is abdominal tenderness. There is no guarding.  Musculoskeletal:     Right lower leg: No edema.     Left lower leg: No edema.   Skin:    General: Skin is warm and dry.  Neurological:     Mental Status: She is alert and oriented to person, place, and time. Mental status is at baseline.  Psychiatric:        Mood and Affect: Mood normal.        Behavior: Behavior normal.     Labs reviewed:  Basic Metabolic Panel: Recent Labs    11/21/20 1132 05/22/21 0953 05/27/21 1130  NA 140 140 139  K 4.7 5.4* 4.5  CL 105 105 105  CO2 25 32 27  GLUCOSE 104* 118 109  BUN 17 21 22   CREATININE 0.74 0.75 0.75  CALCIUM 9.7 9.7 9.9  TSH 1.28  --   --    Liver Function Tests: Recent Labs    11/21/20 1132 05/22/21 0953  AST 16 14  ALT 10 14  BILITOT 0.5 0.5  PROT 6.6 6.4   No results for input(s): "LIPASE", "AMYLASE" in the last 8760 hours. No results for input(s): "AMMONIA" in the last 8760 hours. CBC: Recent Labs    11/21/20 1132 05/22/21 0953  WBC 6.1 8.1  NEUTROABS 1,726 3,386  HGB 13.6 14.6  HCT 40.8 44.2  MCV 91.1 91.5  PLT 284 279   Lipid Panel: Recent Labs    11/21/20 1132  CHOL 193  HDL 62  LDLCALC 101*  TRIG 181*  CHOLHDL 3.1   TSH: Recent Labs    11/21/20 1132  TSH 1.28   A1C: Lab Results  Component Value Date   HGBA1C 5.4 11/21/2020     Assessment/Plan  1. SOB (shortness of breath) - endorsed by daughter, states pt sometimes has problems finishing her sentences and looks short of breath with activity.  - pt states that sometimes she just gets pain and that "knocks the wind" out of her but does not feel short of breath.  Requesting COVID test as similar symptoms were present the last time she had COVID however she does not appear to be acutely ill at this time.  - SARS-COV-2 RNA,(COVID-19) QUAL NAAT  2. Diarrhea, unspecified type - recommend to drink a Gatorade after each episode to ensure electrolyte balance. - encourage hydration with water  - bland diet and to advance as tolerated -question if diarrhea is not a consequence of her constipation.  - CBC with  Differential/Platelet - CMP with eGFR([facility]) - Amylase - Lipase  3. Essential hypertension - controlled at this time -continue current med regime - EKG 12-Lead normal sinus rhythm rate 65  4. Class 1 obesity due to excess calories with serious comorbidity and body mass index (BMI) of 34.0 to 34.9 in adult - exercise as tolerated, likely shortntess of breath due to weight and inactivity  - lifestyle modifications  5. Encounter for screening for COVID-19 - will await results  6. Gastroesophageal reflux disease without esophagitis - continue on nexium - add pepcid in the evening - avoid irritating food  7. Irritable bowel syndrome with diarrhea Ongoing, continues bentyl   8. Primary osteoarthritis involving multiple joints She is currently on mobic daily which discussed could be causing irritation of the stomach but she does not wish to stop the medication has it is helping with her joint pain.    Student- Waunita Schooner, RN I personally was present during the history, physical exam and medical decision-making activities of this service and have verified that the service and findings are accurately documented in the student's note Avyukt Cimo K. [place], [facility] [phone]

## 2021-11-15 NOTE — Patient Instructions (Signed)
Can try to add pepcid 10 mg daily in the evening.  Probiotic twice daily Bland diet advance as tolerated Gatorade/electrollyte drink with every episode of diarrhea.

## 2021-11-18 LAB — SARS-COV-2 RNA,(COVID-19) QUALITATIVE NAAT: SARS CoV2 RNA: NOT DETECTED

## 2021-11-21 ENCOUNTER — Other Ambulatory Visit: Payer: Self-pay | Admitting: Nurse Practitioner

## 2021-11-21 DIAGNOSIS — F419 Anxiety disorder, unspecified: Secondary | ICD-10-CM

## 2021-11-25 ENCOUNTER — Encounter: Payer: Self-pay | Admitting: Nurse Practitioner

## 2021-11-25 ENCOUNTER — Ambulatory Visit (INDEPENDENT_AMBULATORY_CARE_PROVIDER_SITE_OTHER): Payer: Medicare HMO | Admitting: Nurse Practitioner

## 2021-11-25 VITALS — BP 116/78 | HR 56 | Temp 97.9°F | Ht 64.0 in | Wt 200.0 lb

## 2021-11-25 DIAGNOSIS — E782 Mixed hyperlipidemia: Secondary | ICD-10-CM

## 2021-11-25 DIAGNOSIS — K219 Gastro-esophageal reflux disease without esophagitis: Secondary | ICD-10-CM

## 2021-11-25 DIAGNOSIS — I10 Essential (primary) hypertension: Secondary | ICD-10-CM | POA: Diagnosis not present

## 2021-11-25 DIAGNOSIS — F339 Major depressive disorder, recurrent, unspecified: Secondary | ICD-10-CM

## 2021-11-25 DIAGNOSIS — R197 Diarrhea, unspecified: Secondary | ICD-10-CM

## 2021-11-25 NOTE — Progress Notes (Signed)
Careteam: Patient Care Team: [name], NP as PCP - General (Geriatric Medicine) Jethro Bolus, MD as Consulting Physician (Ophthalmology)  PLACE [facility] SERVICE:  [facility]  Advanced Directive information Does Patient Have a Medical Advance Directive?: Yes, Type of Advance Directive: Out of facility DNR (pink MOST or yellow form), Pre-existing out of facility DNR order (yellow form or pink MOST form): Yellow form placed in chart (order not valid for inpatient use);Pink MOST form placed in chart (order not valid for inpatient use), Does patient want to make changes to medical advance directive?: No - Patient declined  Allergies  Allergen Reactions   Amoxicillin Diarrhea    Chief Complaint  Patient presents with   Medical Management of Chronic Issues    6 month follow-up. Discuss need for flu vaccine(high dose out of stock) or post pone if patient refuses. NCIR Verified. Ongoing mucous in throat.      HPI: Patient is a 81 y.o. female who presents today for routine maintenance. Was seen recently for an acute visit with concern for COVID-19 with the main symptoms being diarrhea. COVID-19 was negative and diarrhea has since resolved. Pt thinks is may have been a stomach bug because now her daughter has similar symptoms.   Pepcid was added nightly at last visit and reports an improvement in symptoms.  States her mood is fair. She had been feeling down because was isolating herself so she wouldn't pass the stomach bug. But now that she is able to be with family again, she is feeling better.   Complains of increase in mucus production but does not want to add another medication. Discussed hydration to loosen up the mucus and if becomes intolerable then can try a clarity or mucinex.     Review of Systems:  Review of Systems  Constitutional:  Negative for chills, fever and weight loss.  HENT:  Negative for congestion and hearing loss.   Eyes:  Negative for blurred vision and  double vision.  Respiratory:  Positive for sputum production. Negative for cough and shortness of breath.   Cardiovascular:  Negative for chest pain and leg swelling.  Gastrointestinal:  Negative for abdominal pain, blood in stool, constipation, diarrhea, heartburn, nausea and vomiting.  Genitourinary:  Negative for dysuria and hematuria.  Musculoskeletal:  Positive for joint pain.  Skin:  Negative for itching and rash.  Neurological:  Negative for headaches.  Endo/Heme/Allergies:  Negative for polydipsia.  Psychiatric/Behavioral:  Negative for depression. The patient is not nervous/anxious and does not have insomnia.     Past Medical History:  Diagnosis Date   Anxiety and depression    Cataract    Eye abnormality    film over eye that was not cataract   Fall    GERD (gastroesophageal reflux disease)    Hyperlipemia    Hypertension    Osteoarthritis    Thyroid disease    Past Surgical History:  Procedure Laterality Date   ABDOMINAL HYSTERECTOMY  1978   BREAST LUMPECTOMY  1980   SALIVARY STONE REMOVAL  1980's   TONSILLECTOMY  1950's   Social History:   reports that she has never smoked. She has never used smokeless tobacco. She reports that she does not drink alcohol and does not use drugs.  Family History  Problem Relation Age of Onset   Hypertension Mother    Heart disease Mother    Heart attack Father 79   Heart disease Father    Heart attack Sister  Arthritis Sister    Diabetes Brother     Medications: Patient's Medications  New Prescriptions   No medications on file  Previous Medications   ASPIRIN EC 81 MG TABLET    Take 81 mg by mouth daily.   CHOLECALCIFEROL (VITAMIN D3) 2000 UNITS TABS    Take 1 tablet by mouth daily.   DICYCLOMINE (BENTYL) 20 MG TABLET    TAKE 1 TABLET TWICE DAILY   DULOXETINE (CYMBALTA) 60 MG CAPSULE    TAKE 1 CAPSULE EVERY DAY   ESOMEPRAZOLE (NEXIUM) 40 MG CAPSULE    Take 1 capsule (40 mg total) by mouth daily at 12 noon.    FAMOTIDINE (PEPCID) 20 MG TABLET    Take 20 mg by mouth daily. In the evening   LISINOPRIL-HYDROCHLOROTHIAZIDE (ZESTORETIC) 10-12.5 MG TABLET    TAKE 1 TABLET EVERY DAY   LOPERAMIDE (IMODIUM A-D) 2 MG TABLET    Take 1 tablet (2 mg total) by mouth 3 (three) times daily as needed for diarrhea or loose stools (for diarrhea).   MELOXICAM (MOBIC) 7.5 MG TABLET    TAKE 1 TABLET(7.5 MG) BY MOUTH DAILY AS NEEDED FOR PAIN   METOPROLOL SUCCINATE (TOPROL-XL) 50 MG 24 HR TABLET    TAKE 1 TABLET EVERY DAY WITH OR IMMEDIATELY FOLLOWING A MEAL.   PROBIOTIC CHEW    Chew 1 Dose by mouth 2 (two) times daily.   ROSUVASTATIN (CRESTOR) 5 MG TABLET    TAKE 1 TABLET THREE TIMES WEEKLY  Modified Medications   No medications on file  Discontinued Medications   ACETAMINOPHEN (TYLENOL) 500 MG TABLET    Take 500 mg by mouth 2 (two) times daily.   PROBIOTIC PRODUCT (PHILLIPS COLON HEALTH) CAPS    Take 1 capsule by mouth daily at 12 noon.    Physical Exam:  Vitals:   11/25/21 0855  BP: 116/78  Pulse: (!) 56  Temp: 97.9 F (36.6 C)  TempSrc: Temporal  SpO2: 94%  Weight: 90.7 kg  Height: 5\' 4"  (1.626 m)   Body mass index is 34.33 kg/m. Wt Readings from Last 3 Encounters:  11/25/21 90.7 kg  11/15/21 89.5 kg  05/27/21 94.3 kg    Physical Exam Constitutional:      General: She is not in acute distress.    Appearance: She is obese. She is not toxic-appearing.  HENT:     Mouth/Throat:     Mouth: Mucous membranes are moist.     Pharynx: Oropharynx is clear. No oropharyngeal exudate.  Eyes:     Pupils: Pupils are equal, round, and reactive to light.  Cardiovascular:     Rate and Rhythm: Regular rhythm. Bradycardia present.     Pulses: Normal pulses.     Heart sounds: Normal heart sounds.  Pulmonary:     Effort: Pulmonary effort is normal. No respiratory distress.     Breath sounds: Normal breath sounds. No wheezing.  Abdominal:     General: Bowel sounds are normal. There is no distension.      Palpations: Abdomen is soft.     Tenderness: There is no abdominal tenderness.  Musculoskeletal:     Right lower leg: No edema.     Left lower leg: No edema.  Skin:    General: Skin is warm and dry.  Neurological:     Mental Status: She is alert and oriented to person, place, and time. Mental status is at baseline.  Psychiatric:        Mood and Affect: Mood normal.  Behavior: Behavior normal.     Labs reviewed: Basic Metabolic Panel: Recent Labs    05/22/21 0953 05/27/21 1130 11/15/21 1027  NA 140 139 136  K 5.4* 4.5 4.3  CL 105 105 102  CO2 32 27 25  GLUCOSE 118 109 118*  BUN 21 22 23   CREATININE 0.75 0.75 0.97*  CALCIUM 9.7 9.9 9.8   Liver Function Tests: Recent Labs    05/22/21 0953 11/15/21 1027  AST 14 18  ALT 14 22  BILITOT 0.5 0.7  PROT 6.4 6.4   Recent Labs    11/15/21 1027  LIPASE 13  AMYLASE 21   No results for input(s): "AMMONIA" in the last 8760 hours. CBC: Recent Labs    05/22/21 0953 11/15/21 1027  WBC 8.1 9.8  NEUTROABS 3,386 6,635  HGB 14.6 16.0*  HCT 44.2 46.8*  MCV 91.5 90.3  PLT 279 277   Lipid Panel: No results for input(s): "CHOL", "HDL", "LDLCALC", "TRIG", "CHOLHDL", "LDLDIRECT" in the last 8760 hours. TSH: No results for input(s): "TSH" in the last 8760 hours. A1C: Lab Results  Component Value Date   HGBA1C 5.4 11/21/2020     Assessment/Plan 1. Essential hypertension Blood pressure well controlled, goal bp <140/90 Continue current medications and dietary modifications follow metabolic panel  2. Gastroesophageal reflux disease without esophagitis - improved - continue nexium and can use pepcid PRN -lifestyle modifications encouraged  3. Mixed hyperlipidemia - due for lab today, continues dietary modifications with crestor daily  - Lipid panel  4. Diarrhea, unspecified type - resolved at this time  5. Recurrent depression (HCC) - stable,  continue cymbalta   Return in about 6 months (around  05/28/2022) for routine follow up .   Student- 05/30/2022, RN I personally was present during the history, physical exam and medical decision-making activities of this service and have verified that the service and findings are accurately documented in the student's note April Hill  [facility] [phone]

## 2021-11-26 LAB — LIPID PANEL
Cholesterol: 150 mg/dL (ref <200)
HDL: 48 mg/dL — ABNORMAL LOW (ref >=50)
LDL Cholesterol (Calc): 77 mg/dL (calc)
Non-HDL Cholesterol (Calc): 102 mg/dL (calc) (ref <130)
Total CHOL/HDL Ratio: 3.1 (calc) (ref <5.0)
Triglycerides: 151 mg/dL — ABNORMAL HIGH (ref <150)

## 2022-01-01 ENCOUNTER — Ambulatory Visit: Payer: Medicare HMO | Admitting: Orthopaedic Surgery

## 2022-01-01 ENCOUNTER — Other Ambulatory Visit: Payer: Self-pay | Admitting: Nurse Practitioner

## 2022-01-01 ENCOUNTER — Encounter: Payer: Self-pay | Admitting: Orthopaedic Surgery

## 2022-01-01 ENCOUNTER — Other Ambulatory Visit: Payer: Self-pay

## 2022-01-01 DIAGNOSIS — M170 Bilateral primary osteoarthritis of knee: Secondary | ICD-10-CM | POA: Diagnosis not present

## 2022-01-01 DIAGNOSIS — M159 Polyosteoarthritis, unspecified: Secondary | ICD-10-CM

## 2022-01-01 MED ORDER — HYALURONAN 88 MG/4ML IX SOSY
88.0000 mg | PREFILLED_SYRINGE | INTRA_ARTICULAR | Status: AC | PRN
  Administered 2022-01-01: 88 mg via INTRA_ARTICULAR

## 2022-01-01 NOTE — Progress Notes (Signed)
Office Visit Note   Patient: April Hill           Date of Birth: 10/24/1940           MRN: [id number] Visit Date: 01/01/2022              Requested by: [name], NP [address] [place],  [place] [address] PCP: [name], NP   Assessment & Plan: Visit Diagnoses:  1. Primary osteoarthritis of both knees     Plan: April Hill presents today with her daughter for her  Monovisc injections.  She has a history of bilateral knee arthritis left greater than right she will follow-up as needed.  Follow-Up Instructions: Return in about 1 week (around 01/08/2022).   Orders:  No orders of the defined types were placed in this encounter.  No orders of the defined types were placed in this encounter.     Procedures: Large Joint Inj: bilateral knee on 01/01/2022 11:06 AM Indications: pain and diagnostic evaluation Details: 22 G 1.5 in needle, anteromedial approach  Arthrogram: No  Medications (Right): 88 mg Hyaluronan 88 MG/4ML Medications (Left): 88 mg Hyaluronan 88 MG/4ML Outcome: tolerated well, no immediate complications Procedure, treatment alternatives, risks and benefits explained, specific risks discussed. Consent was given by the patient.     Clinical Data: No additional findings.   Subjective: Chief Complaint  Patient presents with  . Right Knee - Follow-up  . Left Knee - Follow-up    HPI pleasant 81 year old woman with a history of osteoarthritis of her bilateral knees.  She has been approved for Monovisc injections  Review of Systems  All other systems reviewed and are negative.    Objective: Vital Signs: There were no vitals taken for this visit.  Physical Exam Constitutional:      Appearance: Normal appearance.  Pulmonary:     Effort: Pulmonary effort is normal.  Neurological:     Mental Status: She is alert.    Ortho Exam Bilateral knees no effusion no erythema no warmth.  Compartments are soft nontender Specialty Comments:  No  specialty comments available.  Imaging: No results found.   PMFS History: Patient Active Problem List   Diagnosis Date Noted  . Degenerative arthritis of left shoulder region 10/31/2021  . Osteoarthritis of knees, bilateral 10/25/2021  . Elevated alkaline phosphatase level 09/22/2017  . Hypertension   . Hyperlipemia   . Anxiety and depression   . Osteoarthritis   . Irritable bowel syndrome with diarrhea 11/05/2016  . History of colon polyps 11/05/2016  . Abnormal nuclear stress test 08/05/2013  . Vitamin D deficiency 03/11/2011  . Obesity 03/11/2011  . Gastro-esophageal reflux disease without esophagitis 03/11/2011  . Abnormal glucose 03/11/2011   Past Medical History:  Diagnosis Date  . Anxiety and depression   . Cataract   . 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 Fall   . GERD (gastroesophageal reflux disease)   . Hyperlipemia   . Hypertension   . Osteoarthritis   . Thyroid disease     Family History  Problem Relation Age of Onset  . Hypertension Mother   . Heart disease Mother   . Heart attack Father 15  . Heart disease Father   . Heart attack Sister   . Arthritis Sister   . Diabetes Brother     Past Surgical History:  Procedure Laterality Date  . ABDOMINAL HYSTERECTOMY  1978  . BREAST LUMPECTOMY  1980  .  SALIVARY STONE REMOVAL  1980's  . TONSILLECTOMY  1950's   Social History   Occupational History  . Not on file  Tobacco Use  . Smoking status: Never  . Smokeless tobacco: Never  Vaping Use  . Vaping Use: Never used  Substance and Sexual Activity  . Alcohol use: No  . Drug use: No  . Sexual activity: Not Currently

## 2022-01-08 ENCOUNTER — Ambulatory Visit: Payer: Medicare HMO | Admitting: Orthopaedic Surgery

## 2022-01-15 ENCOUNTER — Ambulatory Visit: Payer: Medicare HMO | Admitting: Orthopaedic Surgery

## 2022-02-02 ENCOUNTER — Other Ambulatory Visit: Payer: Self-pay | Admitting: Nurse Practitioner

## 2022-02-02 DIAGNOSIS — E782 Mixed hyperlipidemia: Secondary | ICD-10-CM

## 2022-02-02 DIAGNOSIS — I10 Essential (primary) hypertension: Secondary | ICD-10-CM

## 2022-04-06 ENCOUNTER — Other Ambulatory Visit: Payer: Self-pay | Admitting: Nurse Practitioner

## 2022-04-06 DIAGNOSIS — I10 Essential (primary) hypertension: Secondary | ICD-10-CM

## 2022-05-08 DIAGNOSIS — H524 Presbyopia: Secondary | ICD-10-CM | POA: Diagnosis not present

## 2022-05-08 DIAGNOSIS — Z961 Presence of intraocular lens: Secondary | ICD-10-CM | POA: Diagnosis not present

## 2022-05-08 DIAGNOSIS — H52203 Unspecified astigmatism, bilateral: Secondary | ICD-10-CM | POA: Diagnosis not present

## 2022-05-08 DIAGNOSIS — H353121 Nonexudative age-related macular degeneration, left eye, early dry stage: Secondary | ICD-10-CM | POA: Diagnosis not present

## 2022-05-11 ENCOUNTER — Other Ambulatory Visit: Payer: Self-pay | Admitting: Nurse Practitioner

## 2022-05-11 DIAGNOSIS — M159 Polyosteoarthritis, unspecified: Secondary | ICD-10-CM

## 2022-06-02 ENCOUNTER — Encounter: Payer: Self-pay | Admitting: Nurse Practitioner

## 2022-06-02 ENCOUNTER — Ambulatory Visit (INDEPENDENT_AMBULATORY_CARE_PROVIDER_SITE_OTHER): Payer: Medicare HMO | Admitting: Nurse Practitioner

## 2022-06-02 VITALS — BP 130/82 | HR 66 | Temp 97.8°F | Resp 17 | Ht 64.0 in | Wt 205.0 lb

## 2022-06-02 DIAGNOSIS — F339 Major depressive disorder, recurrent, unspecified: Secondary | ICD-10-CM | POA: Diagnosis not present

## 2022-06-02 DIAGNOSIS — R2689 Other abnormalities of gait and mobility: Secondary | ICD-10-CM

## 2022-06-02 DIAGNOSIS — M170 Bilateral primary osteoarthritis of knee: Secondary | ICD-10-CM

## 2022-06-02 DIAGNOSIS — M19012 Primary osteoarthritis, left shoulder: Secondary | ICD-10-CM | POA: Diagnosis not present

## 2022-06-02 DIAGNOSIS — R7309 Other abnormal glucose: Secondary | ICD-10-CM | POA: Diagnosis not present

## 2022-06-02 DIAGNOSIS — I10 Essential (primary) hypertension: Secondary | ICD-10-CM

## 2022-06-02 NOTE — Progress Notes (Unsigned)
Careteam: Patient Care Team: Lauree Chandler, NP as PCP - General (Geriatric Medicine) Rutherford Guys, MD as Consulting Physician (Ophthalmology)  PLACE OF SERVICE:  [place] Directive information Does Patient Have a Medical Advance Directive?: Yes, Type of Advance Directive: [place];Living will;Out of facility DNR (pink MOST or yellow form), Does patient want to make changes to medical advance directive?: No - Patient declined  Allergies  Allergen Reactions   Amoxicillin Diarrhea    Chief Complaint  Patient presents with   Medical Management of Chronic Issues    6 month follow up and some left shoulder pain   Immunizations    Discussed the need the Tdap     HPI: Patient is a 82 y.o. female here for medical management of chronic issues.   L arm pain radiating from her shoulder. She has chronic BIL shoulder pain but L seems a little bit worse for the last few weeks. It was intermittent prior but been pretty constant for the last few weeks. She has been unable to sleep on that side. Tylenol at night seems to help a little bit. Takes meloxicam in the mornings. She has some sort of foam she has tried for it but doesn't seem to help- [employer] Joint Relief. She has also been off-balance. She fell around the holidays over her son's dog.   She is sleeping decently well until she has to urinate at night but this has not changed.   She has been feeling really tired since she had Covid two years ago (Sept 2021). It seems more acute lately, but it could be attributed to the weather and feeling a little bit worse with it 'being so dark and cold all the time'.   She eats a granola bar for breakfast and a small lunch, and her daughter cooks a full meal for dinner.   She is active around the house but she doesn't do any purposeful exercise at the moment. She goes shopping with her daughter and does walk around a lot when she does that.   Diarrhea is  improved and she is not taking imodium every day. Bowel movements are regular. Denies blood in urine or stool, vaginal bleeding.   Review of Systems:  Review of Systems  Constitutional:  Positive for malaise/fatigue. Negative for chills, fever and weight loss.  HENT:  Negative for congestion and sore throat.   Eyes:  Negative for blurred vision.  Respiratory:  Negative for cough, shortness of breath and wheezing.   Cardiovascular:  Negative for chest pain, palpitations and leg swelling.  Gastrointestinal:  Negative for abdominal pain, blood in stool, constipation, diarrhea, heartburn, nausea and vomiting.  Genitourinary:  Negative for dysuria, frequency, hematuria and urgency.  Musculoskeletal:  Positive for joint pain and myalgias. Negative for falls.       L shoulder pain, chronic, follows with ortho  Skin:  Negative for rash.  Neurological:  Negative for dizziness, tingling and headaches.  Endo/Heme/Allergies:  Negative for polydipsia.  Psychiatric/Behavioral:  Negative for depression. The patient is not nervous/anxious.   ***  Past Medical History:  Diagnosis Date   Anxiety and depression    Cataract    Eye abnormality    film over eye that was not cataract   Fall    GERD (gastroesophageal reflux disease)    Hyperlipemia    Hypertension    Osteoarthritis    Thyroid disease    Past Surgical History:  Procedure Laterality Date  ABDOMINAL HYSTERECTOMY  1978   BREAST LUMPECTOMY  1980   SALIVARY STONE REMOVAL  1980's   TONSILLECTOMY  1950's   Social History:   reports that she has never smoked. She has never used smokeless tobacco. She reports that she does not drink alcohol and does not use drugs.  Family History  Problem Relation Age of Onset   Hypertension Mother    Heart disease Mother    Heart attack Father 49   Heart disease Father    Heart attack Sister    Arthritis Sister    Diabetes Brother     Medications: Patient's Medications  New Prescriptions   No  medications on file  Previous Medications   ASPIRIN EC 81 MG TABLET    Take 81 mg by mouth daily.   CHOLECALCIFEROL (VITAMIN D3) 2000 UNITS TABS    Take 1 tablet by mouth daily.   DICYCLOMINE (BENTYL) 20 MG TABLET    TAKE 1 TABLET TWICE DAILY   DULOXETINE (CYMBALTA) 60 MG CAPSULE    TAKE 1 CAPSULE EVERY DAY   ESOMEPRAZOLE (NEXIUM) 40 MG CAPSULE    Take 1 capsule (40 mg total) by mouth daily at 12 noon.   FAMOTIDINE (PEPCID) 20 MG TABLET    Take 20 mg by mouth daily. In the evening   LISINOPRIL-HYDROCHLOROTHIAZIDE (ZESTORETIC) 10-12.5 MG TABLET    TAKE 1 TABLET EVERY DAY   LOPERAMIDE (IMODIUM A-D) 2 MG TABLET    Take 1 tablet (2 mg total) by mouth 3 (three) times daily as needed for diarrhea or loose stools (for diarrhea).   MELOXICAM (MOBIC) 7.5 MG TABLET    TAKE 1 TABLET(7.5 MG) BY MOUTH DAILY AS NEEDED FOR PAIN   METOPROLOL SUCCINATE (TOPROL-XL) 50 MG 24 HR TABLET    TAKE 1 TABLET EVERY DAY WITH OR IMMEDIATELY FOLLOWING A MEAL.   PROBIOTIC CHEW    Chew 1 Dose by mouth 2 (two) times daily.   ROSUVASTATIN (CRESTOR) 5 MG TABLET    TAKE 1 TABLET THREE TIMES WEEKLY   ZINC SULFATE (ZINC 15 PO)    Take by mouth.  Modified Medications   No medications on file  Discontinued Medications   No medications on file    Physical Exam:  Vitals:   06/02/22 1012  BP: 130/82  Pulse: 66  Resp: 17  Temp: 97.8 F (36.6 C)  TempSrc: Temporal  SpO2: 95%  Weight: 205 lb (93 kg)  Height: '5\' 4"'$  (1.626 m)   Body mass index is 35.19 kg/m. Wt Readings from Last 3 Encounters:  06/02/22 205 lb (93 kg)  11/25/21 200 lb (90.7 kg)  11/15/21 197 lb 6.4 oz (89.5 kg)    Physical Exam Vitals reviewed.  Constitutional:      General: She is not in acute distress.    Appearance: Normal appearance.  Cardiovascular:     Rate and Rhythm: Normal rate and regular rhythm.  Pulmonary:     Effort: No respiratory distress.     Breath sounds: Normal breath sounds.  Abdominal:     General: Bowel sounds are normal.  There is no distension.     Palpations: Abdomen is soft. There is no mass.     Tenderness: There is no abdominal tenderness. There is no guarding.  Musculoskeletal:     Cervical back: Neck supple.  Lymphadenopathy:     Cervical: No cervical adenopathy.  Skin:    General: Skin is warm and dry.  Neurological:     Mental Status: She is  alert and oriented to person, place, and time.  Psychiatric:        Mood and Affect: Mood normal.   ***  Labs reviewed: Basic Metabolic Panel: Recent Labs    11/15/21 1027  NA 136  K 4.3  CL 102  CO2 25  GLUCOSE 118*  BUN 23  CREATININE 0.97*  CALCIUM 9.8   Liver Function Tests: Recent Labs    11/15/21 1027  AST 18  ALT 22  BILITOT 0.7  PROT 6.4   Recent Labs    11/15/21 1027  LIPASE 13  AMYLASE 21   No results for input(s): "AMMONIA" in the last 8760 hours. CBC: Recent Labs    11/15/21 1027  WBC 9.8  NEUTROABS 6,635  HGB 16.0*  HCT 46.8*  MCV 90.3  PLT 277   Lipid Panel: Recent Labs    11/25/21 1032  CHOL 150  HDL 48*  LDLCALC 77  TRIG 151*  CHOLHDL 3.1   TSH: No results for input(s): "TSH" in the last 8760 hours. A1C: Lab Results  Component Value Date   HGBA1C 5.4 11/21/2020     Assessment/Plan 1. Essential hypertension BP controlled on lisinopril-hctz and metoprolol. Avoid foods high in sodium. Increase physical activity as tolerated.  - Complete Metabolic Panel with eGFR - CBC with Differential/Platelet  2. Imbalance Pt in agreement to try PT. Fall precautions  3. Primary osteoarthritis of both knees Follows with orthopedics. Takes meloxicam and tylenol as needed. She can add OTC diclofenac gel as needed. In agreement to try PT. - Ambulatory referral to [facility]  4. Primary osteoarthritis of left shoulder Follows with ortho and plans to see them soon. Discussed benefits of PT at length with patient vs loss of ROM while avoiding being active at home. She is reluctantly in agreement to try PT  at this time.    Return in about 6 months (around 12/01/2022) for routine follow up.: ***  Student- Archer Asa O'Berry ACPCNP-S  I personally was present during the history, physical exam and medical decision-making activities of this service and have verified that the service and findings are accurately documented in the student's note Athaliah Baumbach K. [place], [facility] [phone]

## 2022-06-03 DIAGNOSIS — F339 Major depressive disorder, recurrent, unspecified: Secondary | ICD-10-CM | POA: Insufficient documentation

## 2022-06-04 DIAGNOSIS — M160 Bilateral primary osteoarthritis of hip: Secondary | ICD-10-CM | POA: Diagnosis not present

## 2022-06-04 DIAGNOSIS — I10 Essential (primary) hypertension: Secondary | ICD-10-CM | POA: Diagnosis not present

## 2022-06-04 DIAGNOSIS — M19042 Primary osteoarthritis, left hand: Secondary | ICD-10-CM | POA: Diagnosis not present

## 2022-06-04 DIAGNOSIS — M19011 Primary osteoarthritis, right shoulder: Secondary | ICD-10-CM | POA: Diagnosis not present

## 2022-06-04 DIAGNOSIS — E785 Hyperlipidemia, unspecified: Secondary | ICD-10-CM | POA: Diagnosis not present

## 2022-06-04 DIAGNOSIS — M19041 Primary osteoarthritis, right hand: Secondary | ICD-10-CM | POA: Diagnosis not present

## 2022-06-04 DIAGNOSIS — M19012 Primary osteoarthritis, left shoulder: Secondary | ICD-10-CM | POA: Diagnosis not present

## 2022-06-04 DIAGNOSIS — M170 Bilateral primary osteoarthritis of knee: Secondary | ICD-10-CM | POA: Diagnosis not present

## 2022-06-04 DIAGNOSIS — G8929 Other chronic pain: Secondary | ICD-10-CM | POA: Diagnosis not present

## 2022-06-05 ENCOUNTER — Encounter: Payer: Self-pay | Admitting: Nurse Practitioner

## 2022-06-05 DIAGNOSIS — E119 Type 2 diabetes mellitus without complications: Secondary | ICD-10-CM | POA: Insufficient documentation

## 2022-06-05 LAB — CBC WITH DIFFERENTIAL/PLATELET
Absolute Monocytes: 547 cells/uL (ref 200–950)
Basophils Absolute: 67 cells/uL (ref 0–200)
Basophils Relative: 0.7 %
Eosinophils Absolute: 250 cells/uL (ref 15–500)
Eosinophils Relative: 2.6 %
HCT: 44.9 % (ref 35.0–45.0)
Hemoglobin: 15.2 g/dL (ref 11.7–15.5)
Lymphs Abs: 3504 cells/uL (ref 850–3900)
MCH: 29.6 pg (ref 27.0–33.0)
MCHC: 33.9 g/dL (ref 32.0–36.0)
MCV: 87.5 fL (ref 80.0–100.0)
MPV: 11.3 fL (ref 7.5–12.5)
Monocytes Relative: 5.7 %
Neutro Abs: 5232 cells/uL (ref 1500–7800)
Neutrophils Relative %: 54.5 %
Platelets: 294 10*3/uL (ref 140–400)
RBC: 5.13 10*6/uL — ABNORMAL HIGH (ref 3.80–5.10)
RDW: 12.8 % (ref 11.0–15.0)
Total Lymphocyte: 36.5 %
WBC: 9.6 10*3/uL (ref 3.8–10.8)

## 2022-06-05 LAB — TEST AUTHORIZATION

## 2022-06-05 LAB — COMPLETE METABOLIC PANEL WITH GFR
AG Ratio: 1.6 (calc) (ref 1.0–2.5)
ALT: 12 U/L (ref 6–29)
AST: 15 U/L (ref 10–35)
Albumin: 4 g/dL (ref 3.6–5.1)
Alkaline phosphatase (APISO): 165 U/L — ABNORMAL HIGH (ref 37–153)
BUN: 25 mg/dL (ref 7–25)
CO2: 23 mmol/L (ref 20–32)
Calcium: 9.8 mg/dL (ref 8.6–10.4)
Chloride: 107 mmol/L (ref 98–110)
Creat: 0.71 mg/dL (ref 0.60–0.95)
Globulin: 2.5 g/dL (calc) (ref 1.9–3.7)
Glucose, Bld: 110 mg/dL — ABNORMAL HIGH (ref 65–99)
Potassium: 4.4 mmol/L (ref 3.5–5.3)
Sodium: 139 mmol/L (ref 135–146)
Total Bilirubin: 0.5 mg/dL (ref 0.2–1.2)
Total Protein: 6.5 g/dL (ref 6.1–8.1)
eGFR: 85 mL/min/{1.73_m2} (ref >=60)

## 2022-06-05 LAB — HEMOGLOBIN A1C
Hgb A1c MFr Bld: 6.5 % of total Hgb — ABNORMAL HIGH (ref <5.7)
Mean Plasma Glucose: 140 mg/dL
eAG (mmol/L): 7.7 mmol/L

## 2022-06-05 NOTE — Progress Notes (Signed)
A1c shows diabetes, recommend dietary modifications to avoid medications, we will need to continue to follow up A1c every 6 months to ensure control.  Encouraged, routine foot care/monitoring to make sure she does not have any breakdown or non healing wounds as elevated blood sugar and delay healing and will need yearly diabetic eye exams through ophthalmology to screen for diabetic retinopathy

## 2022-06-06 DIAGNOSIS — E785 Hyperlipidemia, unspecified: Secondary | ICD-10-CM | POA: Diagnosis not present

## 2022-06-06 DIAGNOSIS — G8929 Other chronic pain: Secondary | ICD-10-CM | POA: Diagnosis not present

## 2022-06-06 DIAGNOSIS — M160 Bilateral primary osteoarthritis of hip: Secondary | ICD-10-CM | POA: Diagnosis not present

## 2022-06-06 DIAGNOSIS — M19011 Primary osteoarthritis, right shoulder: Secondary | ICD-10-CM | POA: Diagnosis not present

## 2022-06-06 DIAGNOSIS — M19042 Primary osteoarthritis, left hand: Secondary | ICD-10-CM | POA: Diagnosis not present

## 2022-06-06 DIAGNOSIS — M19041 Primary osteoarthritis, right hand: Secondary | ICD-10-CM | POA: Diagnosis not present

## 2022-06-06 DIAGNOSIS — I10 Essential (primary) hypertension: Secondary | ICD-10-CM | POA: Diagnosis not present

## 2022-06-06 DIAGNOSIS — M170 Bilateral primary osteoarthritis of knee: Secondary | ICD-10-CM | POA: Diagnosis not present

## 2022-06-06 DIAGNOSIS — M19012 Primary osteoarthritis, left shoulder: Secondary | ICD-10-CM | POA: Diagnosis not present

## 2022-06-09 DIAGNOSIS — M19012 Primary osteoarthritis, left shoulder: Secondary | ICD-10-CM | POA: Diagnosis not present

## 2022-06-09 DIAGNOSIS — I10 Essential (primary) hypertension: Secondary | ICD-10-CM | POA: Diagnosis not present

## 2022-06-09 DIAGNOSIS — M160 Bilateral primary osteoarthritis of hip: Secondary | ICD-10-CM | POA: Diagnosis not present

## 2022-06-09 DIAGNOSIS — M19011 Primary osteoarthritis, right shoulder: Secondary | ICD-10-CM | POA: Diagnosis not present

## 2022-06-09 DIAGNOSIS — M19041 Primary osteoarthritis, right hand: Secondary | ICD-10-CM | POA: Diagnosis not present

## 2022-06-09 DIAGNOSIS — G8929 Other chronic pain: Secondary | ICD-10-CM | POA: Diagnosis not present

## 2022-06-09 DIAGNOSIS — M19042 Primary osteoarthritis, left hand: Secondary | ICD-10-CM | POA: Diagnosis not present

## 2022-06-09 DIAGNOSIS — M170 Bilateral primary osteoarthritis of knee: Secondary | ICD-10-CM | POA: Diagnosis not present

## 2022-06-09 DIAGNOSIS — E785 Hyperlipidemia, unspecified: Secondary | ICD-10-CM | POA: Diagnosis not present

## 2022-06-11 DIAGNOSIS — I10 Essential (primary) hypertension: Secondary | ICD-10-CM | POA: Diagnosis not present

## 2022-06-11 DIAGNOSIS — G8929 Other chronic pain: Secondary | ICD-10-CM | POA: Diagnosis not present

## 2022-06-11 DIAGNOSIS — E785 Hyperlipidemia, unspecified: Secondary | ICD-10-CM | POA: Diagnosis not present

## 2022-06-11 DIAGNOSIS — M19042 Primary osteoarthritis, left hand: Secondary | ICD-10-CM | POA: Diagnosis not present

## 2022-06-11 DIAGNOSIS — M19011 Primary osteoarthritis, right shoulder: Secondary | ICD-10-CM | POA: Diagnosis not present

## 2022-06-11 DIAGNOSIS — M19041 Primary osteoarthritis, right hand: Secondary | ICD-10-CM | POA: Diagnosis not present

## 2022-06-11 DIAGNOSIS — M170 Bilateral primary osteoarthritis of knee: Secondary | ICD-10-CM | POA: Diagnosis not present

## 2022-06-11 DIAGNOSIS — M19012 Primary osteoarthritis, left shoulder: Secondary | ICD-10-CM | POA: Diagnosis not present

## 2022-06-11 DIAGNOSIS — M160 Bilateral primary osteoarthritis of hip: Secondary | ICD-10-CM | POA: Diagnosis not present

## 2022-06-16 DIAGNOSIS — M160 Bilateral primary osteoarthritis of hip: Secondary | ICD-10-CM | POA: Diagnosis not present

## 2022-06-16 DIAGNOSIS — M170 Bilateral primary osteoarthritis of knee: Secondary | ICD-10-CM | POA: Diagnosis not present

## 2022-06-16 DIAGNOSIS — I10 Essential (primary) hypertension: Secondary | ICD-10-CM | POA: Diagnosis not present

## 2022-06-16 DIAGNOSIS — M19041 Primary osteoarthritis, right hand: Secondary | ICD-10-CM | POA: Diagnosis not present

## 2022-06-16 DIAGNOSIS — G8929 Other chronic pain: Secondary | ICD-10-CM | POA: Diagnosis not present

## 2022-06-16 DIAGNOSIS — E785 Hyperlipidemia, unspecified: Secondary | ICD-10-CM | POA: Diagnosis not present

## 2022-06-16 DIAGNOSIS — M19011 Primary osteoarthritis, right shoulder: Secondary | ICD-10-CM | POA: Diagnosis not present

## 2022-06-16 DIAGNOSIS — M19012 Primary osteoarthritis, left shoulder: Secondary | ICD-10-CM | POA: Diagnosis not present

## 2022-06-16 DIAGNOSIS — M19042 Primary osteoarthritis, left hand: Secondary | ICD-10-CM | POA: Diagnosis not present

## 2022-06-18 DIAGNOSIS — M19042 Primary osteoarthritis, left hand: Secondary | ICD-10-CM | POA: Diagnosis not present

## 2022-06-18 DIAGNOSIS — M19012 Primary osteoarthritis, left shoulder: Secondary | ICD-10-CM | POA: Diagnosis not present

## 2022-06-18 DIAGNOSIS — M160 Bilateral primary osteoarthritis of hip: Secondary | ICD-10-CM | POA: Diagnosis not present

## 2022-06-18 DIAGNOSIS — M170 Bilateral primary osteoarthritis of knee: Secondary | ICD-10-CM | POA: Diagnosis not present

## 2022-06-18 DIAGNOSIS — M19041 Primary osteoarthritis, right hand: Secondary | ICD-10-CM | POA: Diagnosis not present

## 2022-06-18 DIAGNOSIS — E785 Hyperlipidemia, unspecified: Secondary | ICD-10-CM | POA: Diagnosis not present

## 2022-06-18 DIAGNOSIS — M19011 Primary osteoarthritis, right shoulder: Secondary | ICD-10-CM | POA: Diagnosis not present

## 2022-06-18 DIAGNOSIS — G8929 Other chronic pain: Secondary | ICD-10-CM | POA: Diagnosis not present

## 2022-06-18 DIAGNOSIS — I10 Essential (primary) hypertension: Secondary | ICD-10-CM | POA: Diagnosis not present

## 2022-06-23 DIAGNOSIS — M19012 Primary osteoarthritis, left shoulder: Secondary | ICD-10-CM | POA: Diagnosis not present

## 2022-06-23 DIAGNOSIS — M19011 Primary osteoarthritis, right shoulder: Secondary | ICD-10-CM | POA: Diagnosis not present

## 2022-06-23 DIAGNOSIS — M19042 Primary osteoarthritis, left hand: Secondary | ICD-10-CM | POA: Diagnosis not present

## 2022-06-23 DIAGNOSIS — M170 Bilateral primary osteoarthritis of knee: Secondary | ICD-10-CM | POA: Diagnosis not present

## 2022-06-23 DIAGNOSIS — E785 Hyperlipidemia, unspecified: Secondary | ICD-10-CM | POA: Diagnosis not present

## 2022-06-23 DIAGNOSIS — I10 Essential (primary) hypertension: Secondary | ICD-10-CM | POA: Diagnosis not present

## 2022-06-23 DIAGNOSIS — M160 Bilateral primary osteoarthritis of hip: Secondary | ICD-10-CM | POA: Diagnosis not present

## 2022-06-23 DIAGNOSIS — M19041 Primary osteoarthritis, right hand: Secondary | ICD-10-CM | POA: Diagnosis not present

## 2022-06-23 DIAGNOSIS — G8929 Other chronic pain: Secondary | ICD-10-CM | POA: Diagnosis not present

## 2022-06-25 DIAGNOSIS — I10 Essential (primary) hypertension: Secondary | ICD-10-CM | POA: Diagnosis not present

## 2022-06-25 DIAGNOSIS — M19042 Primary osteoarthritis, left hand: Secondary | ICD-10-CM | POA: Diagnosis not present

## 2022-06-25 DIAGNOSIS — G8929 Other chronic pain: Secondary | ICD-10-CM | POA: Diagnosis not present

## 2022-06-25 DIAGNOSIS — M19041 Primary osteoarthritis, right hand: Secondary | ICD-10-CM | POA: Diagnosis not present

## 2022-06-25 DIAGNOSIS — E785 Hyperlipidemia, unspecified: Secondary | ICD-10-CM | POA: Diagnosis not present

## 2022-06-25 DIAGNOSIS — M19011 Primary osteoarthritis, right shoulder: Secondary | ICD-10-CM | POA: Diagnosis not present

## 2022-06-25 DIAGNOSIS — M170 Bilateral primary osteoarthritis of knee: Secondary | ICD-10-CM | POA: Diagnosis not present

## 2022-06-25 DIAGNOSIS — M160 Bilateral primary osteoarthritis of hip: Secondary | ICD-10-CM | POA: Diagnosis not present

## 2022-06-25 DIAGNOSIS — M19012 Primary osteoarthritis, left shoulder: Secondary | ICD-10-CM | POA: Diagnosis not present

## 2022-06-29 ENCOUNTER — Other Ambulatory Visit: Payer: Self-pay | Admitting: Nurse Practitioner

## 2022-06-29 DIAGNOSIS — F419 Anxiety disorder, unspecified: Secondary | ICD-10-CM

## 2022-06-29 DIAGNOSIS — I10 Essential (primary) hypertension: Secondary | ICD-10-CM

## 2022-08-06 ENCOUNTER — Other Ambulatory Visit: Payer: Self-pay | Admitting: Nurse Practitioner

## 2022-08-06 DIAGNOSIS — M159 Polyosteoarthritis, unspecified: Secondary | ICD-10-CM

## 2022-08-06 NOTE — Telephone Encounter (Signed)
Pharmacy requested refill.  Pended Rx and sent to Jessica for approval.  

## 2022-08-11 ENCOUNTER — Ambulatory Visit (INDEPENDENT_AMBULATORY_CARE_PROVIDER_SITE_OTHER): Payer: Medicare HMO | Admitting: Adult Health

## 2022-08-11 ENCOUNTER — Encounter: Payer: Self-pay | Admitting: Adult Health

## 2022-08-11 VITALS — BP 130/80 | HR 99 | Temp 97.5°F | Resp 18 | Ht 64.0 in | Wt 171.0 lb

## 2022-08-11 DIAGNOSIS — K625 Hemorrhage of anus and rectum: Secondary | ICD-10-CM | POA: Diagnosis not present

## 2022-08-11 DIAGNOSIS — K219 Gastro-esophageal reflux disease without esophagitis: Secondary | ICD-10-CM | POA: Diagnosis not present

## 2022-08-11 DIAGNOSIS — K580 Irritable bowel syndrome with diarrhea: Secondary | ICD-10-CM | POA: Diagnosis not present

## 2022-08-11 DIAGNOSIS — R7303 Prediabetes: Secondary | ICD-10-CM

## 2022-08-11 DIAGNOSIS — I10 Essential (primary) hypertension: Secondary | ICD-10-CM

## 2022-08-11 MED ORDER — ESOMEPRAZOLE MAGNESIUM 40 MG PO CPDR: 40.0000 mg | DELAYED_RELEASE_CAPSULE | Freq: Two times a day (BID) | ORAL | 1 refills | Status: DC

## 2022-08-11 NOTE — Progress Notes (Signed)
[facility] clinic  Provider:  Kenard Gower DNP  Code Status:  DNR  Goals of Care:     06/02/2022   10:12 AM  Advanced Directives  Does Patient Have a Medical Advance Directive? Yes  Type of Estate agent of [place];Living will;Out of facility DNR (pink MOST or yellow form)  Does patient want to make changes to medical advance directive? No - Patient declined  Copy of Healthcare Power of Attorney in Chart? Yes - validated most recent copy scanned in chart (See row information)     Chief Complaint  Patient presents with   Acute Visit    Stomach pain and rectal bleeding     HPI: Patient is a 82 y.o. female seen today for an acute visit for stomach pain and rectal bleed. She was accompanied by her daughter. She was constipated yesterday and was in the toilet for an hour then got up. She went to the toilet again at night and had rectal bleed, bright red blood, approx 3 tbsp. She had generalized abdominal pain, feels bloated last night. She had another episode again this morning so they decided to me to the clinic. She denies nausea. No abdominal pain and abdomen is soft. She does not want to go to the hospital. She has IBS for which she takes Bentyl. She stated that she normally has periods of constipation then diarrhea.    Past Medical History:  Diagnosis Date   Anxiety and depression    Cataract    Diabetes (HCC)    Eye abnormality    film over eye that was not cataract   Fall    GERD (gastroesophageal reflux disease)    Hyperlipemia    Hypertension    Osteoarthritis    Thyroid disease     Past Surgical History:  Procedure Laterality Date   ABDOMINAL HYSTERECTOMY  1978   BREAST LUMPECTOMY  1980   SALIVARY STONE REMOVAL  1980's   TONSILLECTOMY  1950's    Allergies  Allergen Reactions   Amoxicillin Diarrhea    Outpatient Encounter Medications as of 08/11/2022  Medication Sig   acetaminophen (TYLENOL) 500 MG tablet Take by mouth.   aspirin  EC 81 MG tablet Take 81 mg by mouth daily.   Cholecalciferol (VITAMIN D3) 2000 units TABS Take 1 tablet by mouth daily.   dicyclomine (BENTYL) 20 MG tablet TAKE 1 TABLET TWICE DAILY   DULoxetine (CYMBALTA) 60 MG capsule TAKE 1 CAPSULE EVERY DAY   esomeprazole (NEXIUM) 40 MG capsule Take 1 capsule (40 mg total) by mouth daily at 12 noon.   famotidine (PEPCID) 20 MG tablet Take 20 mg by mouth daily. In the evening   lisinopril-hydrochlorothiazide (ZESTORETIC) 10-12.5 MG tablet TAKE 1 TABLET EVERY DAY   loperamide (IMODIUM A-D) 2 MG tablet Take 1 tablet (2 mg total) by mouth 3 (three) times daily as needed for diarrhea or loose stools (for diarrhea).   meloxicam (MOBIC) 7.5 MG tablet TAKE 1 TABLET EVERY DAY AS NEEDED FOR PAIN   metoprolol succinate (TOPROL-XL) 50 MG 24 hr tablet TAKE 1 TABLET EVERY DAY WITH OR IMMEDIATELY FOLLOWING A MEAL.   Probiotic CHEW Chew 1 Dose by mouth 2 (two) times daily.   rosuvastatin (CRESTOR) 5 MG tablet TAKE 1 TABLET THREE TIMES WEEKLY   [DISCONTINUED] Zinc Sulfate (ZINC 15 PO) Take by mouth.   No facility-administered encounter medications on file as of 08/11/2022.    Review of Systems:  Review of Systems  Constitutional:  Negative for  appetite change, chills, fatigue and fever.  HENT:  Negative for congestion, hearing loss, rhinorrhea and sore throat.   Eyes: Negative.   Respiratory:  Negative for cough, shortness of breath and wheezing.   Cardiovascular:  Negative for chest pain, palpitations and leg swelling.  Gastrointestinal:  Positive for abdominal pain, anal bleeding and constipation. Negative for blood in stool, diarrhea, nausea and vomiting.  Genitourinary:  Negative for dysuria.  Musculoskeletal:  Negative for arthralgias, back pain and myalgias.  Skin:  Negative for color change, rash and wound.  Neurological:  Negative for dizziness, weakness and headaches.  Psychiatric/Behavioral:  Negative for behavioral problems. The patient is not  nervous/anxious.     Health Maintenance  Topic Date Due   FOOT EXAM  Never done   OPHTHALMOLOGY EXAM  Never done   Diabetic kidney evaluation - Urine ACR  Never done   DTaP/Tdap/Td (2 - Td or Tdap) 04/07/2022   Medicare Annual Wellness (AWV)  08/16/2022   DEXA SCAN  08/16/2022 (Originally 09/16/2004)   INFLUENZA VACCINE  11/06/2022   HEMOGLOBIN A1C  12/01/2022   Diabetic kidney evaluation - eGFR measurement  06/03/2023   Pneumonia Vaccine 69+ Years old  Completed   Zoster Vaccines- Shingrix  Completed   HPV VACCINES  Aged Out   COVID-19 Vaccine  Discontinued    Physical Exam: Vitals:   08/11/22 1533  BP: 130/80  Pulse: 99  Resp: 18  Temp: (!) 97.5 F (36.4 C)  SpO2: 95%  Weight: 171 lb (77.6 kg)  Height: 5\' 4"  (1.626 m)   Body mass index is 29.35 kg/m. Physical Exam Constitutional:      Appearance: Normal appearance.  HENT:     Head: Normocephalic and atraumatic.     Nose: Nose normal.     Mouth/Throat:     Mouth: Mucous membranes are moist.  Eyes:     Conjunctiva/sclera: Conjunctivae normal.  Cardiovascular:     Rate and Rhythm: Normal rate and regular rhythm.  Pulmonary:     Effort: Pulmonary effort is normal.     Breath sounds: Normal breath sounds.  Abdominal:     General: Bowel sounds are normal.     Palpations: Abdomen is soft.  Musculoskeletal:        General: Normal range of motion.     Cervical back: Normal range of motion.  Skin:    General: Skin is warm and dry.  Neurological:     General: No focal deficit present.     Mental Status: She is alert and oriented to person, place, and time.  Psychiatric:        Mood and Affect: Mood normal.        Behavior: Behavior normal.        Thought Content: Thought content normal.        Judgment: Judgment normal.     Labs reviewed: Basic Metabolic Panel: Recent Labs    11/15/21 1027 06/02/22 0000  NA 136 139  K 4.3 4.4  CL 102 107  CO2 25 23  GLUCOSE 118* 110*  BUN 23 25  CREATININE 0.97*  0.71  CALCIUM 9.8 9.8   Liver Function Tests: Recent Labs    11/15/21 1027 06/02/22 0000  AST 18 15  ALT 22 12  BILITOT 0.7 0.5  PROT 6.4 6.5   Recent Labs    11/15/21 1027  LIPASE 13  AMYLASE 21   No results for input(s): "AMMONIA" in the last 8760 hours. CBC: Recent Labs  11/15/21 1027 06/02/22 0000  WBC 9.8 9.6  NEUTROABS 6,635 5,232  HGB 16.0* 15.2  HCT 46.8* 44.9  MCV 90.3 87.5  PLT 277 294   Lipid Panel: Recent Labs    11/25/21 1032  CHOL 150  HDL 48*  LDLCALC 77  TRIG 811*  CHOLHDL 3.1   Lab Results  Component Value Date   HGBA1C 6.5 (H) 06/02/2022    Procedures since last visit: No results found.  Assessment/Plan  1. Rectal bleed -  no noted blood on rectal exam -  will increase dosage of Nexium 40 mg from daily to BID - CBC With Differential/Platelet - Basic Metabolic Panel with eGFR - Ambulatory referral to Gastroenterology  2. Gastroesophageal reflux disease without esophagitis - esomeprazole (NEXIUM) 40 MG capsule; Take 1 capsule (40 mg total) by mouth 2 (two) times daily before a meal.  Dispense: 60 capsule; Refill: 1  3. Prediabetes Lab Results  Component Value Date   HGBA1C 6.5 (H) 06/02/2022   -  diet-controlled  4. Essential hypertension -  BP 130/80, stable -  continue Lisinopril-HCTZ and Metoprolol succinate  5. Irritable bowel syndrome with diarrhea -  continue Bentyl     Labs/tests ordered:  CBC and BMP  Next appt:  08/19/2022

## 2022-08-12 LAB — CBC WITH DIFFERENTIAL/PLATELET
Absolute Monocytes: 1164 cells/uL — ABNORMAL HIGH (ref 200–950)
Basophils Absolute: 82 cells/uL (ref 0–200)
Basophils Relative: 0.5 %
Eosinophils Absolute: 197 cells/uL (ref 15–500)
Eosinophils Relative: 1.2 %
HCT: 45.9 % — ABNORMAL HIGH (ref 35.0–45.0)
Hemoglobin: 15.3 g/dL (ref 11.7–15.5)
Lymphs Abs: 5018 cells/uL — ABNORMAL HIGH (ref 850–3900)
MCH: 29.1 pg (ref 27.0–33.0)
MCHC: 33.3 g/dL (ref 32.0–36.0)
MCV: 87.3 fL (ref 80.0–100.0)
MPV: 11.7 fL (ref 7.5–12.5)
Monocytes Relative: 7.1 %
Neutro Abs: 9938 cells/uL — ABNORMAL HIGH (ref 1500–7800)
Neutrophils Relative %: 60.6 %
Platelets: 291 10*3/uL (ref 140–400)
RBC: 5.26 10*6/uL — ABNORMAL HIGH (ref 3.80–5.10)
RDW: 12.8 % (ref 11.0–15.0)
Total Lymphocyte: 30.6 %
WBC: 16.4 10*3/uL — ABNORMAL HIGH (ref 3.8–10.8)

## 2022-08-12 LAB — BASIC METABOLIC PANEL WITH GFR
BUN: 20 mg/dL (ref 7–25)
CO2: 22 mmol/L (ref 20–32)
Calcium: 9.5 mg/dL (ref 8.6–10.4)
Chloride: 105 mmol/L (ref 98–110)
Creat: 0.75 mg/dL (ref 0.60–0.95)
Glucose, Bld: 131 mg/dL — ABNORMAL HIGH (ref 65–99)
Potassium: 4.1 mmol/L (ref 3.5–5.3)
Sodium: 139 mmol/L (ref 135–146)
eGFR: 79 mL/min/{1.73_m2} (ref >=60)

## 2022-08-12 NOTE — Addendum Note (Signed)
Addended by: Kenard Gower C on: 08/12/2022 11:31 AM   Modules accepted: Orders

## 2022-08-14 NOTE — Progress Notes (Signed)
Wbc is elevated at 16.4 (normal 3.8 - 10.8), do you have any fever, cough or difficulty urinating?

## 2022-08-18 ENCOUNTER — Other Ambulatory Visit: Payer: Self-pay | Admitting: Adult Health

## 2022-08-18 DIAGNOSIS — D72829 Elevated white blood cell count, unspecified: Secondary | ICD-10-CM

## 2022-08-18 NOTE — Progress Notes (Signed)
-   We need to re-check your WBC again just to make sure it is not going up some more. I will put in an order for repeat wbc and you can come in again today or within this week. Are you having any other symptoms such as fever/chills, difficulty urinating or cough?

## 2022-08-19 ENCOUNTER — Encounter: Payer: Medicare HMO | Admitting: Nurse Practitioner

## 2022-08-21 ENCOUNTER — Other Ambulatory Visit: Payer: Self-pay

## 2022-08-21 ENCOUNTER — Other Ambulatory Visit: Payer: Medicare HMO

## 2022-08-21 DIAGNOSIS — D72829 Elevated white blood cell count, unspecified: Secondary | ICD-10-CM

## 2022-08-21 LAB — CBC WITH DIFFERENTIAL/PLATELET
Absolute Monocytes: 945 cells/uL (ref 200–950)
Basophils Absolute: 88 cells/uL (ref 0–200)
Basophils Relative: 0.7 %
Eosinophils Absolute: 265 cells/uL (ref 15–500)
Eosinophils Relative: 2.1 %
HCT: 45.8 % — ABNORMAL HIGH (ref 35.0–45.0)
Hemoglobin: 14.9 g/dL (ref 11.7–15.5)
Lymphs Abs: 4977 cells/uL — ABNORMAL HIGH (ref 850–3900)
MCH: 29.1 pg (ref 27.0–33.0)
MCHC: 32.5 g/dL (ref 32.0–36.0)
MCV: 89.5 fL (ref 80.0–100.0)
MPV: 11 fL (ref 7.5–12.5)
Monocytes Relative: 7.5 %
Neutro Abs: 6325 cells/uL (ref 1500–7800)
Neutrophils Relative %: 50.2 %
Platelets: 321 10*3/uL (ref 140–400)
RBC: 5.12 10*6/uL — ABNORMAL HIGH (ref 3.80–5.10)
RDW: 12.8 % (ref 11.0–15.0)
Total Lymphocyte: 39.5 %
WBC: 12.6 10*3/uL — ABNORMAL HIGH (ref 3.8–10.8)

## 2022-08-22 ENCOUNTER — Encounter: Payer: Self-pay | Admitting: Adult Health

## 2022-08-22 ENCOUNTER — Ambulatory Visit (INDEPENDENT_AMBULATORY_CARE_PROVIDER_SITE_OTHER): Payer: Medicare HMO | Admitting: Adult Health

## 2022-08-22 DIAGNOSIS — E119 Type 2 diabetes mellitus without complications: Secondary | ICD-10-CM | POA: Diagnosis not present

## 2022-08-22 DIAGNOSIS — Z0000 Encounter for general adult medical examination without abnormal findings: Secondary | ICD-10-CM

## 2022-08-22 NOTE — Patient Instructions (Signed)
  April Hill , Thank you for taking time to come for your Medicare Wellness Visit. I appreciate your ongoing commitment to your health goals. Please review the following plan we discussed and let me know if I can assist you in the future.   These are the goals we discussed:  Goals      Patient Stated     Stay healthy  and happy. Being with kids and seeing them, with family        This is a list of the screening recommended for you and due dates:  Health Maintenance  Topic Date Due   Complete foot exam   Never done   Eye exam for diabetics  Never done   Yearly kidney health urinalysis for diabetes  Never done   DEXA scan (bone density measurement)  Never done   DTaP/Tdap/Td vaccine (2 - Td or Tdap) 04/07/2022   Flu Shot  11/06/2022   Hemoglobin A1C  12/01/2022   Yearly kidney function blood test for diabetes  08/11/2023   Medicare Annual Wellness Visit  08/22/2023   Pneumonia Vaccine  Completed   Zoster (Shingles) Vaccine  Completed   HPV Vaccine  Aged Out   COVID-19 Vaccine  Discontinued

## 2022-08-22 NOTE — Progress Notes (Signed)
This is a video visit:  Patient location:  home  Provider location:  [facility]  Subjective:   April Hill is a 82 y.o. female who presents for Medicare Annual (Subsequent) preventive examination.  Review of Systems     Cardiac Risk Factors include: diabetes mellitus;dyslipidemia;hypertension;sedentary lifestyle     Objective:    There were no vitals filed for this visit. There is no height or weight on file to calculate BMI.     08/22/2022   10:32 AM 06/02/2022   10:12 AM 11/25/2021    8:56 AM 08/15/2021   10:59 AM 05/27/2021   10:59 AM 11/21/2020   11:00 AM 08/09/2020   10:06 AM  Advanced Directives  Does Patient Have a Medical Advance Directive? Yes Yes Yes Yes Yes Yes Yes  Type of Advance Directive Out of facility DNR (pink MOST or yellow form) Healthcare Power of [place];Living will;Out of facility DNR (pink MOST or yellow form) Out of facility DNR (pink MOST or yellow form) Out of facility DNR (pink MOST or yellow form) Out of facility DNR (pink MOST or yellow form) Out of facility DNR (pink MOST or yellow form) Out of facility DNR (pink MOST or yellow form)  Does patient want to make changes to medical advance directive? No - Patient declined No - Patient declined No - Patient declined No - Patient declined No - Patient declined No - Patient declined No - Patient declined  Copy of Healthcare Power of Attorney in Chart?  Yes - validated most recent copy scanned in chart (See row information)       Pre-existing out of facility DNR order (yellow form or pink MOST form) Pink MOST form placed in chart (order not valid for inpatient use);Yellow form placed in chart (order not valid for inpatient use)  Yellow form placed in chart (order not valid for inpatient use);Pink MOST form placed in chart (order not valid for inpatient use) Yellow form placed in chart (order not valid for inpatient use) Yellow form placed in chart (order not valid for inpatient use);Pink MOST form placed in chart  (order not valid for inpatient use) Yellow form placed in chart (order not valid for inpatient use);Pink MOST form placed in chart (order not valid for inpatient use) Yellow form placed in chart (order not valid for inpatient use);Pink MOST form placed in chart (order not valid for inpatient use)    Current Medications (verified) Outpatient Encounter Medications as of 08/22/2022  Medication Sig   acetaminophen (TYLENOL) 500 MG tablet Take by mouth.   aspirin EC 81 MG tablet Take 81 mg by mouth daily.   Cholecalciferol (VITAMIN D3) 2000 units TABS Take 1 tablet by mouth daily.   dicyclomine (BENTYL) 20 MG tablet TAKE 1 TABLET TWICE DAILY   DULoxetine (CYMBALTA) 60 MG capsule TAKE 1 CAPSULE EVERY DAY   esomeprazole (NEXIUM) 40 MG capsule Take 1 capsule (40 mg total) by mouth 2 (two) times daily before a meal.   famotidine (PEPCID) 20 MG tablet Take 20 mg by mouth daily. In the evening   lisinopril-hydrochlorothiazide (ZESTORETIC) 10-12.5 MG tablet TAKE 1 TABLET EVERY DAY   loperamide (IMODIUM A-D) 2 MG tablet Take 1 tablet (2 mg total) by mouth 3 (three) times daily as needed for diarrhea or loose stools (for diarrhea).   meloxicam (MOBIC) 7.5 MG tablet TAKE 1 TABLET EVERY DAY AS NEEDED FOR PAIN   metoprolol succinate (TOPROL-XL) 50 MG 24 hr tablet TAKE 1 TABLET EVERY DAY WITH OR IMMEDIATELY FOLLOWING  A MEAL.   Probiotic CHEW Chew 1 Dose by mouth 2 (two) times daily.   rosuvastatin (CRESTOR) 5 MG tablet TAKE 1 TABLET THREE TIMES WEEKLY   No facility-administered encounter medications on file as of 08/22/2022.    Allergies (verified) Amoxicillin   History: Past Medical History:  Diagnosis Date   Anxiety and depression    Cataract    Diabetes (HCC)    Eye abnormality    film over eye that was not cataract   Fall    GERD (gastroesophageal reflux disease)    Hyperlipemia    Hypertension    Osteoarthritis    Thyroid disease    Past Surgical History:  Procedure Laterality Date    ABDOMINAL HYSTERECTOMY  1978   BREAST LUMPECTOMY  1980   SALIVARY STONE REMOVAL  1980's   TONSILLECTOMY  1950's   Family History  Problem Relation Age of Onset   Hypertension Mother    Heart disease Mother    Heart attack Father 19   Heart disease Father    Heart attack Sister    Arthritis Sister    Diabetes Brother    Social History   Socioeconomic History   Marital status: Widowed    Spouse name: Not on file   Number of children: Not on file   Years of education: Not on file   Highest education level: Not on file  Occupational History   Not on file  Tobacco Use   Smoking status: Never   Smokeless tobacco: Never  Vaping Use   Vaping Use: Never used  Substance and Sexual Activity   Alcohol use: No   Drug use: No   Sexual activity: Not Currently  Other Topics Concern   Not on file  Social History Narrative   Social History      Diet? Off and on      Do you drink/eat things with caffeine? yes      Marital status?  married                                  What year were you married? 1960      Do you live in a house, apartment, assisted living, condo, trailer, etc.? House with daughter      Is it one or more stories? 2-we live in basement apt      How many persons live in your home? 4      Do you have any pets in your home? (please list) cat      Highest level of education completed? High school      Current or past profession: Diplomatic Services operational officer- cashier-hostess      Do you exercise?         no                             Type & how often? --      Advanced Directives      Do you have a living will? yes      Do you have a DNR form?       yes                            If not, do you want to discuss one?      Do you have signed POA/HPOA for forms? yes  Functional Status      Do you have difficulty bathing or dressing yourself? no      Do you have difficulty preparing food or eating? no      Do you have difficulty managing your medications? no      Do  you have difficulty managing your finances? no      Do you have difficulty affording your medications? no   Social Determinants of Health   Financial Resource Strain: Low Risk  (05/22/2017)   Overall Financial Resource Strain (CARDIA)    Difficulty of Paying Living Expenses: Not hard at all  Food Insecurity: No Food Insecurity (05/22/2017)   Hunger Vital Sign    Worried About Running Out of Food in the Last Year: Never true    Ran Out of Food in the Last Year: Never true  Transportation Needs: No Transportation Needs (05/22/2017)   PRAPARE - Administrator, Civil Service (Medical): No    Lack of Transportation (Non-Medical): No  Physical Activity: Inactive (05/22/2017)   Exercise Vital Sign    Days of Exercise per Week: 0 days    Minutes of Exercise per Session: 0 min  Stress: Stress Concern Present (05/22/2017)   [employer] of Occupational Health - Occupational Stress Questionnaire    Feeling of Stress : Very much  Social Connections: Somewhat Isolated (05/22/2017)   Social Connection and Isolation Panel [NHANES]    Frequency of Communication with Friends and Family: More than three times a week    Frequency of Social Gatherings with Friends and Family: More than three times a week    Attends Religious Services: Never    Database administrator or Organizations: No    Attends Banker Meetings: Never    Marital Status: Married    Tobacco Counseling Counseling given: Not Answered   Clinical Intake:                 Diabetic? Yes         Activities of Daily Living    08/22/2022   10:24 AM  In your present state of health, do you have any difficulty performing the following activities:  Hearing? 1  Vision? 0  Difficulty concentrating or making decisions? 0  Walking or climbing stairs? 0  Dressing or bathing? 0  Doing errands, shopping? 0  Preparing Food and eating ? N  Using the Toilet? N  In the past six months, have you  accidently leaked urine? Y  Comment Stress incontinence  Do you have problems with loss of bowel control? Y  Comment occasional, once a week  Managing your Medications? N  Managing your Finances? N  Housekeeping or managing your Housekeeping? N    Patient Care Team: [name], NP as PCP - General (Geriatric Medicine) Jethro Bolus, MD as Consulting Physician (Ophthalmology)  Indicate any recent Medical Services you may have received from other than [facility] providers in the past year (date may be approximate).     Assessment:   This is a routine wellness examination for April Hill.  Hearing/Vision screen Hearing Screening - Comments:: Patient has a little bit of hearing, also she has hearing aid but does not wears them. Vision Screening - Comments:: Patient has no problem with vision.  Dietary issues and exercise activities discussed:     Goals Addressed             This Visit's Progress    Patient Stated  Stay healthy  and happy. Being with kids and seeing them, with family       Depression Screen    08/11/2022    3:34 PM 11/25/2021   10:02 AM 08/15/2021   10:56 AM 05/27/2021   10:58 AM 08/09/2020   10:00 AM 09/28/2019    8:35 AM 08/08/2019    1:59 PM  PHQ 2/9 Scores  PHQ - 2 Score 0 0 0 0 0 0 0  PHQ- 9 Score 0          Fall Risk    08/22/2022   10:26 AM 08/11/2022    3:34 PM 06/02/2022   10:11 AM 11/25/2021   10:02 AM 11/15/2021    9:36 AM  Fall Risk   Falls in the past year? 0 0 0 0 0  Number falls in past yr: 0 0 0 0 0  Injury with Fall? 0 0 0 0 0  Risk for fall due to : No Fall Risks  History of fall(s) No Fall Risks No Fall Risks  Follow up    Falls evaluation completed     FALL RISK PREVENTION PERTAINING TO THE HOME:  Any stairs in or around the home? No  If so, are there any without handrails?  N/A Home free of loose throw rugs in walkways, pet beds, electrical cords, etc? Yes  Adequate lighting in your home to reduce risk of falls? Yes    ASSISTIVE DEVICES UTILIZED TO PREVENT FALLS:  Life alert? No  Use of a cane, walker or w/c? Yes  Grab bars in the bathroom? Yes  Shower chair or bench in shower? Yes  Elevated toilet seat or a handicapped toilet? Yes   TIMED UP AND GO:  Was the test performed? No .  Length of time to ambulate 10 feet: N/A sec.   Gait steady and fast without use of assistive device  Cognitive Function:    08/08/2019    1:44 PM 05/22/2017    9:35 AM  MMSE - Mini Mental State Exam  Orientation to time 5 5  Orientation to Place 5 5  Registration 3 3  Attention/ Calculation 5 5  Recall 2 3  Language- name 2 objects 2 2  Language- repeat 1 1  Language- follow 3 step command 3 3  Language- read & follow direction 1 1  Write a sentence 1 1  Copy design 1 0  Total score 29 29        08/22/2022   10:26 AM 08/15/2021   10:59 AM 08/09/2020   10:06 AM 08/05/2018    2:46 PM  6CIT Screen  What Year? 0 points 0 points 0 points 0 points  What month? 0 points 0 points 0 points 0 points  What time? 0 points 0 points 0 points 0 points  Count back from 20 0 points 0 points 0 points 0 points  Months in reverse 0 points 0 points 0 points 0 points  Repeat phrase 0 points 4 points 0 points 0 points  Total Score 0 points 4 points 0 points 0 points    Immunizations Immunization History  Administered Date(s) Administered   Fluad Quad(high Dose 65+) 01/30/2020   Influenza, High Dose Seasonal PF 01/20/2017, 12/08/2018, 01/29/2021   Influenza, Seasonal, Injecte, Preservative Fre 04/07/2010   Influenza-Unspecified 02/06/2016, 01/01/2018   Pneumococcal Conjugate-13 05/08/2014   Pneumococcal Polysaccharide-23 04/07/2009   Tdap 04/07/2012   Zoster Recombinat (Shingrix) 12/08/2018, 02/07/2019   Zoster, Live 05/08/2014    TDAP status:  Due, Education has been provided regarding the importance of this vaccine. Advised may receive this vaccine at local pharmacy or Health Dept. Aware to provide a copy of the  vaccination record if obtained from local pharmacy or Health Dept. Verbalized acceptance and understanding.  Flu Vaccine status: Up to date  Pneumococcal vaccine status: Up to date  Covid-19 vaccine status: Declined, Education has been provided regarding the importance of this vaccine but patient still declined. Advised may receive this vaccine at local pharmacy or Health Dept.or vaccine clinic. Aware to provide a copy of the vaccination record if obtained from local pharmacy or Health Dept. Verbalized acceptance and understanding.  Qualifies for Shingles Vaccine? Yes   Zostavax completed Yes   Shingrix Completed?: Yes  Screening Tests Health Maintenance  Topic Date Due   FOOT EXAM  Never done   OPHTHALMOLOGY EXAM  Never done   Diabetic kidney evaluation - Urine ACR  Never done   DEXA SCAN  Never done   DTaP/Tdap/Td (2 - Td or Tdap) 04/07/2022   INFLUENZA VACCINE  11/06/2022   HEMOGLOBIN A1C  12/01/2022   Diabetic kidney evaluation - eGFR measurement  08/11/2023   Medicare Annual Wellness (AWV)  08/22/2023   Pneumonia Vaccine 25+ Years old  Completed   Zoster Vaccines- Shingrix  Completed   HPV VACCINES  Aged Out   COVID-19 Vaccine  Discontinued    Health Maintenance  Health Maintenance Due  Topic Date Due   FOOT EXAM  Never done   OPHTHALMOLOGY EXAM  Never done   Diabetic kidney evaluation - Urine ACR  Never done   DEXA SCAN  Never done   DTaP/Tdap/Td (2 - Td or Tdap) 04/07/2022    Colorectal cancer screening: Type of screening: Colonoscopy. Completed years ago. Repeat every not sure years  Mammogram status: No longer required due to 81 Y/O.  Bone Density status: Ordered declined. Pt provided with contact info and advised to call to schedule appt.  Lung Cancer Screening: (Low Dose CT Chest recommended if Age 13-80 years, 30 pack-year currently smoking OR have quit w/in 15years.) does not qualify.   Lung Cancer Screening Referral: No  Additional  Screening:  Hepatitis C Screening: does not qualify; Completed No  Vision Screening: Recommended annual ophthalmology exams for early detection of glaucoma and other disorders of the eye. Is the patient up to date with their annual eye exam?  Yes  Who is the provider or what is the name of the office in which the patient attends annual eye exams? Dr. Nile Riggs If pt is not established with a provider, would they like to be referred to a provider to establish care? No .   Dental Screening: Recommended annual dental exams for proper oral hygiene  Community Resource Referral / Chronic Care Management: CRR required this visit?  No   CCM required this visit?  No      Plan:     I have personally reviewed and noted the following in the patient's chart:   Medical and social history Use of alcohol, tobacco or illicit drugs  Current medications and supplements including opioid prescriptions. Patient is not currently taking opioid prescriptions. Functional ability and status Nutritional status Physical activity Advanced directives List of other physicians Hospitalizations, surgeries, and ER visits in previous 12 months Vitals Screenings to include cognitive, depression, and falls Referrals and appointments  In addition, I have reviewed and discussed with patient certain preventive protocols, quality metrics, and best practice recommendations. A written personalized care plan for  preventive services as well as general preventive health recommendations were provided to patient.     Odalis Jordan Medina-Vargas, NP   08/22/2022   Nurse Notes: Has an appointment to see GI, Dr. Marca Ancona, on September 15, 2022. Plans to get Tdap at her pharmacy.

## 2022-09-11 ENCOUNTER — Other Ambulatory Visit: Payer: Self-pay | Admitting: Nurse Practitioner

## 2022-09-11 DIAGNOSIS — E782 Mixed hyperlipidemia: Secondary | ICD-10-CM

## 2022-09-15 DIAGNOSIS — K625 Hemorrhage of anus and rectum: Secondary | ICD-10-CM | POA: Diagnosis not present

## 2022-09-15 DIAGNOSIS — K529 Noninfective gastroenteritis and colitis, unspecified: Secondary | ICD-10-CM | POA: Diagnosis not present

## 2022-10-27 ENCOUNTER — Other Ambulatory Visit: Payer: Self-pay | Admitting: *Deleted

## 2022-10-27 DIAGNOSIS — M159 Polyosteoarthritis, unspecified: Secondary | ICD-10-CM

## 2022-10-27 DIAGNOSIS — M150 Primary generalized (osteo)arthritis: Secondary | ICD-10-CM

## 2022-10-27 MED ORDER — MELOXICAM 7.5 MG PO TABS
ORAL_TABLET | ORAL | 5 refills | Status: DC
Start: 2022-10-27 — End: 2023-04-28

## 2022-10-27 NOTE — Telephone Encounter (Signed)
[facility] requested refill.  Pended Rx and sent to [facility] for approval due to Shanda Bumps out of office.

## 2022-11-03 ENCOUNTER — Other Ambulatory Visit: Payer: Self-pay | Admitting: *Deleted

## 2022-11-03 MED ORDER — DICYCLOMINE HCL 20 MG PO TABS: 20.0000 mg | ORAL_TABLET | Freq: Two times a day (BID) | ORAL | 1 refills | Status: DC

## 2022-11-03 NOTE — Telephone Encounter (Signed)
Patient called requesting refill

## 2022-11-06 ENCOUNTER — Encounter: Payer: Self-pay | Admitting: Nurse Practitioner

## 2022-11-07 ENCOUNTER — Ambulatory Visit: Payer: Medicare HMO | Admitting: Nurse Practitioner

## 2022-11-07 ENCOUNTER — Encounter: Payer: Self-pay | Admitting: Nurse Practitioner

## 2022-11-07 VITALS — BP 124/78 | HR 64 | Temp 97.3°F | Ht 64.0 in | Wt 200.0 lb

## 2022-11-07 DIAGNOSIS — Z9109 Other allergy status, other than to drugs and biological substances: Secondary | ICD-10-CM | POA: Diagnosis not present

## 2022-11-07 DIAGNOSIS — M170 Bilateral primary osteoarthritis of knee: Secondary | ICD-10-CM

## 2022-11-07 DIAGNOSIS — R2689 Other abnormalities of gait and mobility: Secondary | ICD-10-CM | POA: Diagnosis not present

## 2022-11-07 DIAGNOSIS — M19012 Primary osteoarthritis, left shoulder: Secondary | ICD-10-CM | POA: Diagnosis not present

## 2022-11-07 DIAGNOSIS — R7303 Prediabetes: Secondary | ICD-10-CM

## 2022-11-07 DIAGNOSIS — K625 Hemorrhage of anus and rectum: Secondary | ICD-10-CM

## 2022-11-07 DIAGNOSIS — K580 Irritable bowel syndrome with diarrhea: Secondary | ICD-10-CM | POA: Diagnosis not present

## 2022-11-07 DIAGNOSIS — I10 Essential (primary) hypertension: Secondary | ICD-10-CM | POA: Diagnosis not present

## 2022-11-07 DIAGNOSIS — K219 Gastro-esophageal reflux disease without esophagitis: Secondary | ICD-10-CM

## 2022-11-07 LAB — CBC WITH DIFFERENTIAL/PLATELET
Absolute Monocytes: 816 cells/uL (ref 200–950)
Basophils Absolute: 72 cells/uL (ref 0–200)
Basophils Relative: 0.6 %
Eosinophils Absolute: 336 cells/uL (ref 15–500)
Eosinophils Relative: 2.8 %
HCT: 46.9 % — ABNORMAL HIGH (ref 35.0–45.0)
Hemoglobin: 15.2 g/dL (ref 11.7–15.5)
Lymphs Abs: 5004 cells/uL — ABNORMAL HIGH (ref 850–3900)
MCH: 29.1 pg (ref 27.0–33.0)
MCHC: 32.4 g/dL (ref 32.0–36.0)
MCV: 89.7 fL (ref 80.0–100.0)
MPV: 11.6 fL (ref 7.5–12.5)
Monocytes Relative: 6.8 %
Neutro Abs: 5772 cells/uL (ref 1500–7800)
Neutrophils Relative %: 48.1 %
Platelets: 302 10*3/uL (ref 140–400)
RBC: 5.23 10*6/uL — ABNORMAL HIGH (ref 3.80–5.10)
RDW: 12.9 % (ref 11.0–15.0)
Total Lymphocyte: 41.7 %
WBC: 12 10*3/uL — ABNORMAL HIGH (ref 3.8–10.8)

## 2022-11-07 NOTE — Progress Notes (Unsigned)
Careteam: Patient Care Team: April Seller, NP as PCP - General (Geriatric Medicine) April Bolus, MD as Consulting Physician (Ophthalmology)  PLACE OF SERVICE:  [facility]  Advanced Directive information Does Patient Have a Medical Advance Directive?: Yes, Type of Advance Directive: Out of facility DNR (pink MOST or yellow form), Pre-existing out of facility DNR order (yellow form or pink MOST form): Yellow form placed in chart (order not valid for inpatient use);Pink MOST form placed in chart (order not valid for inpatient use), Does patient want to make changes to medical advance directive?: No - Patient declined  Allergies  Allergen Reactions   Amoxicillin Diarrhea    Chief Complaint  Patient presents with   Medical Management of Chronic Issues    6 month follow up and foot exam. Discuss need for eye exam, diabetic kidney evaluation, DEXA, td/tdap, and flu vaccine (out of stock). Patient c/o dizziness, balance concerns, trouble swallowing and left shoulder pain. Patient with ongoing knee issues (getting worse). Discuss nexium and mobic dosing. Discuss low pulse      HPI: Patient is a 82 y.o. female for routine follow up.   She increase nexium to twice daily after GI bleed for about a month then it resolved so she went back to only taking once daily.  No dark stools. No increase in GERD or indigestion.   She is using mobic once daily for pain- takes in the morning. She missed a day of taking medication and pain was worse.   Has noticed HR been lower at home, she took blood pressure at home and it was 110s/58 She has complained about being dizzy on and off for the last week.  She takes bp daily and generally 130-150s/ Pulse is always in the 60s She is staying well hydrated   Currently not having dizziness- only occasionally.   Daughter reports when she walks she is unsteady, walk into a doorway or cabinet.  She reports she does feel like she is getting weaker.   She is inactive during the day.  Her knees hurt when she walks.  Left arm is limited ROM.  She has become a lot less active due to pain.  She did therapy but it was not beneficial. Felt like it was a waste of time.    Review of Systems:  Review of Systems  Constitutional:  Negative for chills, fever and weight loss.  HENT:  Negative for tinnitus.   Respiratory:  Negative for cough, sputum production and shortness of breath.   Cardiovascular:  Negative for chest pain, palpitations and leg swelling.  Gastrointestinal:  Negative for abdominal pain, constipation, diarrhea and heartburn.  Genitourinary:  Negative for dysuria, frequency and urgency.  Musculoskeletal:  Positive for joint pain. Negative for back pain, falls and myalgias.  Skin: Negative.   Neurological:  Positive for weakness. Negative for dizziness and headaches.  Psychiatric/Behavioral:  Negative for depression and memory loss. The patient does not have insomnia.     Past Medical History:  Diagnosis Date   Anxiety and depression    Cataract    Diabetes (HCC)    Eye abnormality    film over eye that was not cataract   Fall    GERD (gastroesophageal reflux disease)    Hyperlipemia    Hypertension    Osteoarthritis    Thyroid disease    Past Surgical History:  Procedure Laterality Date   ABDOMINAL HYSTERECTOMY  1978   BREAST LUMPECTOMY  1980   SALIVARY STONE  REMOVAL  1980's   TONSILLECTOMY  1950's   Social History:   reports that she has never smoked. She has never used smokeless tobacco. She reports that she does not drink alcohol and does not use drugs.  Family History  Problem Relation Age of Onset   Hypertension Mother    Heart disease Mother    Heart attack Father 63   Heart disease Father    Heart attack Sister    Arthritis Sister    Diabetes Brother     Medications: Patient's Medications  New Prescriptions   No medications on file  Previous Medications   ACETAMINOPHEN (TYLENOL) 500 MG  TABLET    Take 500 mg by mouth as needed.   ASPIRIN EC 81 MG TABLET    Take 81 mg by mouth daily.   CHOLECALCIFEROL (VITAMIN D3) 2000 UNITS TABS    Take 1 tablet by mouth daily.   DICYCLOMINE (BENTYL) 20 MG TABLET    Take 1 tablet (20 mg total) by mouth 2 (two) times daily.   DULOXETINE (CYMBALTA) 60 MG CAPSULE    TAKE 1 CAPSULE EVERY DAY   ESOMEPRAZOLE (NEXIUM) 40 MG CAPSULE    Take 40 mg by mouth daily at 12 noon.   LISINOPRIL-HYDROCHLOROTHIAZIDE (ZESTORETIC) 10-12.5 MG TABLET    TAKE 1 TABLET EVERY DAY   LOPERAMIDE (IMODIUM A-D) 2 MG TABLET    Take 1 tablet (2 mg total) by mouth 3 (three) times daily as needed for diarrhea or loose stools (for diarrhea).   MELOXICAM (MOBIC) 7.5 MG TABLET    TAKE 1 TABLET EVERY DAY AS NEEDED FOR PAIN   METOPROLOL SUCCINATE (TOPROL-XL) 50 MG 24 HR TABLET    TAKE 1 TABLET EVERY DAY WITH OR IMMEDIATELY FOLLOWING A MEAL.   PROBIOTIC CHEW    Chew 1 Dose by mouth 2 (two) times daily.   ROSUVASTATIN (CRESTOR) 5 MG TABLET    TAKE 1 TABLET THREE TIMES WEEKLY  Modified Medications   No medications on file  Discontinued Medications   ESOMEPRAZOLE (NEXIUM) 40 MG CAPSULE    Take 1 capsule (40 mg total) by mouth 2 (two) times daily before a meal.   FAMOTIDINE (PEPCID) 20 MG TABLET    Take 20 mg by mouth daily. In the evening    Physical Exam:  Vitals:   11/06/22 1625  BP: 124/78  Pulse: 64  Temp: (!) 97.3 F (36.3 C)  TempSrc: Temporal  SpO2: 98%  Weight: 200 lb (90.7 kg)  Height: 5\' 4"  (1.626 m)   Body mass index is 34.33 kg/m. Wt Readings from Last 3 Encounters:  11/06/22 200 lb (90.7 kg)  08/11/22 171 lb (77.6 kg)  06/02/22 205 lb (93 kg)    Physical Exam Constitutional:      General: She is not in acute distress.    Appearance: She is well-developed. She is not diaphoretic.  HENT:     Head: Normocephalic and atraumatic.     Mouth/Throat:     Pharynx: No oropharyngeal exudate.  Eyes:     Conjunctiva/sclera: Conjunctivae normal.     Pupils:  Pupils are equal, round, and reactive to light.  Cardiovascular:     Rate and Rhythm: Normal rate and regular rhythm.     Heart sounds: Normal heart sounds.  Pulmonary:     Effort: Pulmonary effort is normal.     Breath sounds: Normal breath sounds.  Abdominal:     General: Bowel sounds are normal.     Palpations: Abdomen is  soft.  Musculoskeletal:        General: Tenderness present.     Right shoulder: Decreased range of motion.     Left shoulder: Decreased range of motion.     Cervical back: Normal range of motion and neck supple.     Right lower leg: No edema.     Left lower leg: No edema.  Skin:    General: Skin is warm and dry.  Neurological:     Mental Status: She is alert.  Psychiatric:        Mood and Affect: Mood normal.     Labs reviewed: Basic Metabolic Panel: Recent Labs    11/15/21 1027 06/02/22 0000 08/11/22 1555  NA 136 139 139  K 4.3 4.4 4.1  CL 102 107 105  CO2 25 23 22   GLUCOSE 118* 110* 131*  BUN 23 25 20   CREATININE 0.97* 0.71 0.75  CALCIUM 9.8 9.8 9.5   Liver Function Tests: Recent Labs    11/15/21 1027 06/02/22 0000  AST 18 15  ALT 22 12  BILITOT 0.7 0.5  PROT 6.4 6.5   Recent Labs    11/15/21 1027  LIPASE 13  AMYLASE 21   No results for input(s): "AMMONIA" in the last 8760 hours. CBC: Recent Labs    06/02/22 0000 08/11/22 1555 08/21/22 1339  WBC 9.6 16.4* 12.6*  NEUTROABS 5,232 9,938* 6,325  HGB 15.2 15.3 14.9  HCT 44.9 45.9* 45.8*  MCV 87.5 87.3 89.5  PLT 294 291 321   Lipid Panel: Recent Labs    11/25/21 1032  CHOL 150  HDL 48*  LDLCALC 77  TRIG 629*  CHOLHDL 3.1   TSH: No results for input(s): "TSH" in the last 8760 hours. A1C: Lab Results  Component Value Date   HGBA1C 6.5 (H) 06/02/2022     Assessment/Plan 1. Prediabetes -continue dietary modifications and increase in physical activity as tolerates - Microalbumin/Creatinine Ratio, Urine - Ambulatory referral to Ophthalmology - Hemoglobin  A1c  2. Essential hypertension -Blood pressure well controlled, goal bp <140/90 Continue current medications and dietary modifications follow metabolic panel  3. Rectal bleed No signs of current bleeding Discussed risk of mobic and risk of GI bleed due to medication but daughter and pt would like to continue due to increase in pain when not taking.  - CBC with Differential/Platelet  4. Gastroesophageal reflux disease without esophagitis -controlled on protonix without increase in GERD  5. Imbalance -ongoing, fall precautions encouraged  6. Primary osteoarthritis of both knees -continues on mobic at this time. Can also use ice PRN and tylenol for additional relief.   7. Primary osteoarthritis of left shoulder -ongoing, with limited ROM. Continues to use mobic  8. Irritable bowel syndrome with diarrhea Stable at this time  9. Allergy to environmental factors To take Loratadine or cetrizine (generic for Claritin or zyrtec) 10 mg by mouth daily for allergies.  -for increase in congestion can use mucinex by mouth twice daily.    Return in about 6 months (around 05/10/2023) for routine follow up.  April Hill. Biagio Borg [facility] [phone]

## 2022-11-07 NOTE — Patient Instructions (Signed)
To take Loratadine or cetrizine (generic for Claritin or zyrtec) 10 mg by mouth daily for allergies/throat  Mucinex twice daily for a week

## 2022-11-11 ENCOUNTER — Encounter: Payer: Self-pay | Admitting: Nurse Practitioner

## 2023-01-22 ENCOUNTER — Other Ambulatory Visit: Payer: Self-pay | Admitting: Nurse Practitioner

## 2023-01-22 DIAGNOSIS — I10 Essential (primary) hypertension: Secondary | ICD-10-CM

## 2023-02-04 ENCOUNTER — Other Ambulatory Visit: Payer: Self-pay | Admitting: Nurse Practitioner

## 2023-02-04 DIAGNOSIS — E782 Mixed hyperlipidemia: Secondary | ICD-10-CM

## 2023-02-20 DIAGNOSIS — H5203 Hypermetropia, bilateral: Secondary | ICD-10-CM | POA: Diagnosis not present

## 2023-02-24 ENCOUNTER — Encounter: Payer: Self-pay | Admitting: Physician Assistant

## 2023-02-24 ENCOUNTER — Ambulatory Visit: Payer: Medicare HMO | Admitting: Physician Assistant

## 2023-02-24 DIAGNOSIS — M170 Bilateral primary osteoarthritis of knee: Secondary | ICD-10-CM | POA: Diagnosis not present

## 2023-02-24 DIAGNOSIS — M1712 Unilateral primary osteoarthritis, left knee: Secondary | ICD-10-CM

## 2023-02-24 DIAGNOSIS — M1711 Unilateral primary osteoarthritis, right knee: Secondary | ICD-10-CM

## 2023-02-24 MED ORDER — METHYLPREDNISOLONE ACETATE 40 MG/ML IJ SUSP
40.0000 mg | INTRAMUSCULAR | Status: AC | PRN
Start: 2023-02-24 — End: 2023-02-24
  Administered 2023-02-24: 40 mg via INTRA_ARTICULAR

## 2023-02-24 MED ORDER — LIDOCAINE HCL 1 % IJ SOLN
3.0000 mL | INTRAMUSCULAR | Status: AC | PRN
Start: 2023-02-24 — End: 2023-02-24
  Administered 2023-02-24: 3 mL

## 2023-02-24 NOTE — Progress Notes (Signed)
Office Visit Note   Patient: April Hill           Date of Birth: December 19, 1940           MRN: [id number] Visit Date: 02/24/2023              Requested by: [name], NP [address] [place]. [place],  [place] [address] PCP: [name], NP  No chief complaint on file.     HPI: Patient is a pleasant 83 year old woman with a history of bilateral glenohumeral arthritis and bilateral arthritis of her knees.  She comes in reject requesting injections of all 4 as she is going out of town.  No recent injury.  Rates her pain as moderate has had injections in the past  Assessment & Plan: Visit Diagnoses: Osteoarthritis bilateral knees  Plan: Discussed with her and her daughter that I could go forward with knee injections today.  Based on her shoulder x-rays from 5 years ago I do think she would do better following up for glenohumeral injections into her shoulder with Franky Macho as he can do this with ultrasound.  I do also think at that time she would need updated x-rays of her shoulders  Follow-Up Instructions: 2 weeks with Verner Mould Exam  Patient is alert, oriented, no adenopathy, well-dressed, normal affect, normal respiratory effort. Bilateral knees no effusion no erythema compartments are soft and compressible she is neurovascularly intact tender more medially than laterally  Imaging: No results found. No images are attached to the encounter.  Labs: Lab Results  Component Value Date   HGBA1C 6.5 (H) 11/07/2022   HGBA1C 6.5 (H) 06/02/2022   HGBA1C 5.4 11/21/2020     No results found for: "ALBUMIN", "PREALBUMIN", "CBC"  No results found for: "MG" No results found for: "VD25OH"  No results found for: "PREALBUMIN"    Latest Ref Rng & Units 11/07/2022    2:05 PM 08/21/2022    1:39 PM 08/11/2022    3:55 PM  CBC EXTENDED  WBC 3.8 - 10.8 Thousand/uL 12.0  12.6  16.4   RBC 3.80 - 5.10 Million/uL 5.23  5.12  5.26   Hemoglobin 11.7 - 15.5 g/dL 19.1  47.8  29.5   HCT 35.0 -  45.0 % 46.9  45.8  45.9   Platelets 140 - 400 Thousand/uL 302  321  291   NEUT# 1,500 - 7,800 cells/uL 5,772  6,325  9,938   Lymph# 850 - 3,900 cells/uL 5,004  4,977  5,018      There is no height or weight on file to calculate BMI.  Orders:  No orders of the defined types were placed in this encounter.  No orders of the defined types were placed in this encounter.    Procedures: Large Joint Inj: bilateral knee on 02/24/2023 9:45 AM Indications: pain and diagnostic evaluation Details: 25 G 1.5 in needle, anteromedial approach  Arthrogram: No  Medications (Right): 3 mL lidocaine 1 %; 40 mg methylPREDNISolone acetate 40 MG/ML Medications (Left): 3 mL lidocaine 1 %; 40 mg methylPREDNISolone acetate 40 MG/ML Outcome: tolerated well, no immediate complications Procedure, treatment alternatives, risks and benefits explained, specific risks discussed. Consent was given by the patient.    Clinical Data: No additional findings.  ROS:  All other systems negative, except as noted in the HPI. Review of Systems  Objective: Vital Signs: There were no vitals taken for this visit.  Specialty Comments:  No specialty comments available.  PMFS History: Patient Active Problem  List   Diagnosis Date Noted  . Diabetes (HCC) 06/05/2022  . Recurrent depression (HCC) 06/03/2022  . Degenerative arthritis of left shoulder region 10/31/2021  . Osteoarthritis of knees, bilateral 10/25/2021  . Elevated alkaline phosphatase level 09/22/2017  . Hypertension   . Hyperlipemia   . Anxiety and depression   . Osteoarthritis   . Irritable bowel syndrome with diarrhea 11/05/2016  . History of colon polyps 11/05/2016  . Abnormal nuclear stress test 08/05/2013  . Vitamin D deficiency 03/11/2011  . Morbid obesity (HCC) 03/11/2011  . Gastro-esophageal reflux disease without esophagitis 03/11/2011  . Abnormal glucose 03/11/2011   Past Medical History:  Diagnosis Date  . Anxiety and depression   .  Cataract   . Diabetes (HCC)   . 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 Fall   . GERD (gastroesophageal reflux disease)   . Hyperlipemia   . Hypertension   . Osteoarthritis   . Thyroid disease     Family History  Problem Relation Age of Onset  . Hypertension Mother   . Heart disease Mother   . Heart attack Father 32  . Heart disease Father   . Heart attack Sister   . Arthritis Sister   . Diabetes Brother     Past Surgical History:  Procedure Laterality Date  . ABDOMINAL HYSTERECTOMY  1978  . BREAST LUMPECTOMY  1980  . SALIVARY STONE REMOVAL  1980's  . TONSILLECTOMY  1950's   Social History   Occupational History  . Not on file  Tobacco Use  . Smoking status: Never  . Smokeless tobacco: Never  Vaping Use  . Vaping status: Never Used  Substance and Sexual Activity  . Alcohol use: No  . Drug use: No  . Sexual activity: Not Currently

## 2023-03-11 ENCOUNTER — Other Ambulatory Visit (INDEPENDENT_AMBULATORY_CARE_PROVIDER_SITE_OTHER): Payer: Medicare HMO

## 2023-03-11 ENCOUNTER — Encounter: Payer: Self-pay | Admitting: Surgical

## 2023-03-11 ENCOUNTER — Other Ambulatory Visit: Payer: Self-pay

## 2023-03-11 ENCOUNTER — Ambulatory Visit: Payer: Medicare HMO | Admitting: Surgical

## 2023-03-11 ENCOUNTER — Other Ambulatory Visit (INDEPENDENT_AMBULATORY_CARE_PROVIDER_SITE_OTHER): Payer: Self-pay

## 2023-03-11 DIAGNOSIS — M25511 Pain in right shoulder: Secondary | ICD-10-CM

## 2023-03-11 DIAGNOSIS — M19012 Primary osteoarthritis, left shoulder: Secondary | ICD-10-CM | POA: Diagnosis not present

## 2023-03-11 DIAGNOSIS — G8929 Other chronic pain: Secondary | ICD-10-CM

## 2023-03-11 DIAGNOSIS — M25512 Pain in left shoulder: Secondary | ICD-10-CM

## 2023-03-11 MED ORDER — LIDOCAINE HCL 1 % IJ SOLN
5.0000 mL | INTRAMUSCULAR | Status: AC | PRN
  Administered 2023-03-11: 5 mL

## 2023-03-11 MED ORDER — BUPIVACAINE HCL 0.25 % IJ SOLN
9.0000 mL | INTRAMUSCULAR | Status: AC | PRN
  Administered 2023-03-11: 9 mL via INTRA_ARTICULAR

## 2023-03-11 MED ORDER — METHYLPREDNISOLONE ACETATE 40 MG/ML IJ SUSP
40.0000 mg | INTRAMUSCULAR | Status: AC | PRN
  Administered 2023-03-11: 40 mg via INTRA_ARTICULAR

## 2023-03-11 NOTE — Progress Notes (Signed)
Office Visit Note   Patient: April Hill           Date of Birth: Feb 17, 1941           MRN: [id number] Visit Date: 03/11/2023 Requested by: [name], NP [address] [place]. [place],  [place] [address] PCP: [name], NP  Subjective: Chief Complaint  Patient presents with   Right Shoulder - Pain   Left Shoulder - Pain    HPI: April Hill is a 82 y.o. female who presents to the office reporting history of bilateral shoulder pain.  She has intermittent pain and currently her right shoulder is really not giving her any significant discomfort.  Her left shoulder has been fairly painful for the last 3 to 4 months.  She localizes pain to the posterior lateral aspect of the shoulder with radiation to the bicep.  She does have bilateral trapezius pain but no scapular or axial neck pain.  No numbness or tingling.  She describes pain as a toothache that is fairly consistent and worse with lifting her arm out to the side.  She cannot sleep on her left side but she is able to lay on her right side.  No history of prior surgery to the shoulder.  Does have history of prior injection to the shoulder several years ago that gave her good relief.  She had recent bilateral knee injections with Corrie Dandy and persons PA-C which has helped her knee pain.  Takes meloxicam on occasion.  She is right-hand dominant.  No personal or family history of cancer..                ROS: All systems reviewed are negative as they relate to the chief complaint within the history of present illness.  Patient denies fevers or chills.  Assessment & Plan: Visit Diagnoses:  1. Arthritis of left glenohumeral joint   2. Chronic pain of both shoulders     Plan: Patient is an 82 year old female who presents for evaluation of primarily left shoulder pain.  She has radiographs taken today demonstrating end-stage glenohumeral arthritis.  Her exam is consistent with this with decreased passive range of motion and grinding crepitus  that reproduces her pain on exam.  After discussion of options, she would like to try injection.  Glenohumeral injection administered into the left shoulder under ultrasound guidance and patient tolerated procedure well without complication.  She will follow-up with the office as needed and we can do these injections about every 4 months if this is helpful for her.  Additionally on her radiographs, there was an irregular calcification noted in the soft tissue that measured about 1 cm in size.  This is new compared with previous radiographs from 2019.  Recommended that she reach out to her PCP for further evaluation and sent message to Terressa's PCP.  Follow-Up Instructions: Return if symptoms worsen or fail to improve.   Orders:  Orders Placed This Encounter  Procedures   XR Shoulder Left   XR Shoulder Right   US Guided Needle Placement - No Linked Charges   No orders of the defined types were placed in this encounter.     Procedures: Large Joint Inj: L glenohumeral on 03/11/2023 5:09 PM Details: 22 G 3.5 in needle, ultrasound-guided posterior approach Medications: 5 mL lidocaine 1 %; 9 mL bupivacaine 0.25 %; 40 mg methylPREDNISolone acetate 40 MG/ML Outcome: tolerated well, no immediate complications Procedure, treatment alternatives, risks and benefits explained, specific risks discussed. Consent was given  by the patient. Patient was prepped and draped in the usual sterile fashion.       Clinical Data: No additional findings.  Objective: Vital Signs: There were no vitals taken for this visit.  Physical Exam:  Constitutional: Patient appears well-developed HEENT:  Head: Normocephalic Eyes:EOM are normal Neck: Normal range of motion Cardiovascular: Normal rate Pulmonary/chest: Effort normal Neurologic: Patient is alert Skin: Skin is warm Psychiatric: Patient has normal mood and affect  Ortho Exam: Ortho exam demonstrates right shoulder with 35 degrees X rotation, 90 degrees  abduction, 160 degrees forward elevation passively and actively.  This compared with the left shoulder with 30 degrees X rotation, 65 degrees abduction, 145 degrees forward elevation passively and actively.  She has pain primarily at terminal ends of range of motion.  Intact rotator cuff strength of supra, infra, subscap of the left shoulder rated 5/5.  She does have grinding crepitus noted with passive motion of the left shoulder.  Axillary nerve intact with deltoid firing.  Intact EPL, FPL, finger abduction, Renee/admission, bicep, tricep, deltoid.  Tenderness over the bicipital groove moderately.  Tenderness over the [facility] joint minimally.  No cellulitis or skin changes noted.  No pain with cervical spine range of motion.  Negative Spurling sign.  Specialty Comments:  No specialty comments available.  Imaging: XR Shoulder Right  Result Date: 03/11/2023 AP, Scap Y, axillary views of right shoulder reviewed.  Moderate to severe degenerative changes of the glenohumeral joint.  Severe loss of acromiohumeral interval.  No fracture/dislocation.  XR Shoulder Left  Result Date: 03/11/2023 AP, Scap Y, axillary views of the left shoulder reviewed.  Severe glenohumeral arthritis noted with joint space narrowing and osteophyte formation.  No loss of acromiohumeral interval.  There is a approximately 1 cm irregular calcification noted in the soft tissue anterior to the shoulder blade.  US Guided Needle Placement - No Linked Charges  Result Date: 03/11/2023 Ultrasound imaging demonstrates needle placement into the glenohumeral joint with extravasation of fluid and no complication.     PMFS History: Patient Active Problem List   Diagnosis Date Noted   Diabetes (HCC) 06/05/2022   Recurrent depression (HCC) 06/03/2022   Degenerative arthritis of left shoulder region 10/31/2021   Osteoarthritis of knees, bilateral 10/25/2021   Elevated alkaline phosphatase level 09/22/2017   Hypertension    Hyperlipemia     Anxiety and depression    Osteoarthritis    Irritable bowel syndrome with diarrhea 11/05/2016   History of colon polyps 11/05/2016   Abnormal nuclear stress test 08/05/2013   Vitamin D deficiency 03/11/2011   Morbid obesity (HCC) 03/11/2011   Gastro-esophageal reflux disease without esophagitis 03/11/2011   Abnormal glucose 03/11/2011   Past Medical History:  Diagnosis Date   Anxiety and depression    Cataract    Diabetes (HCC)    Eye abnormality    film over eye that was not cataract   Fall    GERD (gastroesophageal reflux disease)    Hyperlipemia    Hypertension    Osteoarthritis    Thyroid disease     Family History  Problem Relation Age of Onset   Hypertension Mother    Heart disease Mother    Heart attack Father 70   Heart disease Father    Heart attack Sister    Arthritis Sister    Diabetes Brother     Past Surgical History:  Procedure Laterality Date   ABDOMINAL HYSTERECTOMY  1978   BREAST LUMPECTOMY  1980   SALIVARY STONE  REMOVAL  1980's   TONSILLECTOMY  1950's   Social History   Occupational History   Not on file  Tobacco Use   Smoking status: Never   Smokeless tobacco: Never  Vaping Use   Vaping status: Never Used  Substance and Sexual Activity   Alcohol use: No   Drug use: No   Sexual activity: Not Currently

## 2023-03-12 ENCOUNTER — Other Ambulatory Visit: Payer: Self-pay

## 2023-03-12 ENCOUNTER — Encounter: Payer: Self-pay | Admitting: Nurse Practitioner

## 2023-03-12 ENCOUNTER — Telehealth: Payer: Medicare HMO | Admitting: Nurse Practitioner

## 2023-03-12 VITALS — BP 120/94 | HR 72 | Wt 205.0 lb

## 2023-03-12 DIAGNOSIS — R921 Mammographic calcification found on diagnostic imaging of breast: Secondary | ICD-10-CM | POA: Diagnosis not present

## 2023-03-12 NOTE — Progress Notes (Signed)
This service is provided via telemedicine  No vital signs collected/recorded due to the encounter was a telemedicine visit.   Location of patient (ex: home, work):  Home  Patient consents to a telephone visit:  Yes  Location of the provider (ex: office, home):  Office [place].   Name of any referring provider:  na  Names of all persons participating in the telemedicine service and their role in the encounter:  April Hill, Patient, Nelda Severe, CMA, [name], NP  Time spent on call:  7:10

## 2023-03-12 NOTE — Progress Notes (Signed)
Careteam: Patient Care Team: [name], NP as PCP - General (Geriatric Medicine) Jethro Bolus, MD as Consulting Physician (Ophthalmology)  Advanced Directive information Does Patient Have a Medical Advance Directive?: Yes, Type of Advance Directive: Healthcare Power of [place];Living will;Out of facility DNR (pink MOST or yellow form), Does patient want to make changes to medical advance directive?: No - Patient declined  Allergies  Allergen Reactions   Amoxicillin Diarrhea    Chief Complaint  Patient presents with   Acute Visit    Patient is being seen to discuss result of her xray      HPI: Patient is a 82 y.o. female due to abnormal xray done at the orthopedic office.  Reports some irregular calcification noted to left breast when imaging was done to evaluate her left shoulder.  Pts reports she has had lumps in her right breast years ago and did not want further work up then.  States she is not going to do anything about it if something was going on. Reports she does not want to have an additional mammogram- if it was abnormal she would not want further workup. Feeling good and doing well.   Review of Systems:  Review of Systems  Constitutional:  Negative for chills and fever.  Respiratory:  Negative for cough and shortness of breath.   Cardiovascular:  Negative for chest pain.  Gastrointestinal:  Negative for abdominal pain.  Genitourinary:  Negative for dysuria.  Musculoskeletal:  Positive for joint pain. Negative for myalgias.    Past Medical History:  Diagnosis Date   Anxiety and depression    Cataract    Diabetes (HCC)    Eye abnormality    film over eye that was not cataract   Fall    GERD (gastroesophageal reflux disease)    Hyperlipemia    Hypertension    Osteoarthritis    Thyroid disease    Past Surgical History:  Procedure Laterality Date   ABDOMINAL HYSTERECTOMY  1978   BREAST LUMPECTOMY  1980   SALIVARY STONE REMOVAL  1980's    TONSILLECTOMY  1950's   Social History:   reports that she has never smoked. She has never used smokeless tobacco. She reports that she does not drink alcohol and does not use drugs.  Family History  Problem Relation Age of Onset   Hypertension Mother    Heart disease Mother    Heart attack Father 31   Heart disease Father    Heart attack Sister    Arthritis Sister    Diabetes Brother     Medications: Patient's Medications  New Prescriptions   No medications on file  Previous Medications   ACETAMINOPHEN (TYLENOL) 500 MG TABLET    Take 500 mg by mouth as needed.   ASPIRIN EC 81 MG TABLET    Take 81 mg by mouth daily.   CHOLECALCIFEROL (VITAMIN D3) 2000 UNITS TABS    Take 1 tablet by mouth daily.   DICYCLOMINE (BENTYL) 20 MG TABLET    Take 1 tablet (20 mg total) by mouth 2 (two) times daily.   DULOXETINE (CYMBALTA) 60 MG CAPSULE    TAKE 1 CAPSULE EVERY DAY   ESOMEPRAZOLE (NEXIUM) 40 MG CAPSULE    Take 40 mg by mouth daily at 12 noon.   LISINOPRIL-HYDROCHLOROTHIAZIDE (ZESTORETIC) 10-12.5 MG TABLET    TAKE 1 TABLET EVERY DAY   LOPERAMIDE (IMODIUM A-D) 2 MG TABLET    Take 1 tablet (2 mg total) by mouth 3 (three) times  daily as needed for diarrhea or loose stools (for diarrhea).   MELOXICAM (MOBIC) 7.5 MG TABLET    TAKE 1 TABLET EVERY DAY AS NEEDED FOR PAIN   METOPROLOL SUCCINATE (TOPROL-XL) 50 MG 24 HR TABLET    TAKE 1 TABLET EVERY DAY WITH OR IMMEDIATELY FOLLOWING A MEAL.   ROSUVASTATIN (CRESTOR) 5 MG TABLET    TAKE 1 TABLET THREE TIMES WEEKLY  Modified Medications   No medications on file  Discontinued Medications   No medications on file    Physical Exam:  Vitals:   03/12/23 1031  BP: (!) 120/94  Pulse: 72  Weight: 205 lb (93 kg)   Body mass index is 35.19 kg/m. Wt Readings from Last 3 Encounters:  03/12/23 205 lb (93 kg)  11/06/22 200 lb (90.7 kg)  08/11/22 171 lb (77.6 kg)    Physical Exam Constitutional:      Appearance: Normal appearance.  Pulmonary:      Effort: Pulmonary effort is normal.  Neurological:     Mental Status: She is alert. Mental status is at baseline.  Psychiatric:        Mood and Affect: Mood normal.     Labs reviewed: Basic Metabolic Panel: Recent Labs    06/02/22 0000 08/11/22 1555  NA 139 139  K 4.4 4.1  CL 107 105  CO2 23 22  GLUCOSE 110* 131*  BUN 25 20  CREATININE 0.71 0.75  CALCIUM 9.8 9.5   Liver Function Tests: Recent Labs    06/02/22 0000  AST 15  ALT 12  BILITOT 0.5  PROT 6.5   No results for input(s): "LIPASE", "AMYLASE" in the last 8760 hours. No results for input(s): "AMMONIA" in the last 8760 hours. CBC: Recent Labs    08/11/22 1555 08/21/22 1339 11/07/22 1405  WBC 16.4* 12.6* 12.0*  NEUTROABS 9,938* 6,325 5,772  HGB 15.3 14.9 15.2  HCT 45.9* 45.8* 46.9*  MCV 87.3 89.5 89.7  PLT 291 321 302   Lipid Panel: No results for input(s): "CHOL", "HDL", "LDLCALC", "TRIG", "CHOLHDL", "LDLDIRECT" in the last 8760 hours. TSH: No results for input(s): "TSH" in the last 8760 hours. A1C: Lab Results  Component Value Date   HGBA1C 6.5 (H) 11/07/2022     Assessment/Plan 1. Breast calcification, left Noted when xray obtained on left shoulder. She does not want mammogram or any other imaging to further evaluate as she would not want any treatment if something was abnormal.   Shanda Bumps K. Biagio Borg  [facility] [phone]    Virtual Visit via video  I connected with patient on 03/12/23 at 10:20 AM EST by mychart and verified that I am speaking with the correct person using two identifiers.  Location: Patient: home Provider: [facility]   I discussed the limitations, risks, security and privacy concerns of performing an evaluation and management service by telephone and the availability of in person appointments. I also discussed with the patient that there may be a patient responsible charge related to this service. The patient expressed  understanding and agreed to proceed.   I discussed the assessment and treatment plan with the patient. The patient was provided an opportunity to ask questions and all were answered. The patient agreed with the plan and demonstrated an understanding of the instructions.   The patient was advised to call back or seek an in-person evaluation if the symptoms worsen or if the condition fails to improve as anticipated.  I provided 10 minutes of  non-face-to-face time during this encounter.  Janene Harvey. Biagio Borg Avs printed and mailed

## 2023-03-12 NOTE — Progress Notes (Signed)
Please call patient to let her know we were notified about some abnormal findings on her xray (done by ortho for her shoulder) in her chest area and offer her an appt to discuss findings with further work up.  Thank you

## 2023-04-18 ENCOUNTER — Other Ambulatory Visit: Payer: Self-pay | Admitting: Nurse Practitioner

## 2023-04-18 DIAGNOSIS — I10 Essential (primary) hypertension: Secondary | ICD-10-CM

## 2023-04-28 ENCOUNTER — Other Ambulatory Visit: Payer: Self-pay | Admitting: Nurse Practitioner

## 2023-04-28 ENCOUNTER — Other Ambulatory Visit: Payer: Self-pay | Admitting: Family

## 2023-04-28 DIAGNOSIS — F32A Depression, unspecified: Secondary | ICD-10-CM

## 2023-04-28 DIAGNOSIS — M150 Primary generalized (osteo)arthritis: Secondary | ICD-10-CM

## 2023-05-11 ENCOUNTER — Ambulatory Visit (INDEPENDENT_AMBULATORY_CARE_PROVIDER_SITE_OTHER): Payer: Medicare HMO | Admitting: Nurse Practitioner

## 2023-05-11 ENCOUNTER — Encounter: Payer: Self-pay | Admitting: Nurse Practitioner

## 2023-05-11 VITALS — BP 136/82 | HR 63 | Temp 98.7°F | Ht 64.0 in | Wt 197.6 lb

## 2023-05-11 DIAGNOSIS — I10 Essential (primary) hypertension: Secondary | ICD-10-CM | POA: Diagnosis not present

## 2023-05-11 DIAGNOSIS — K580 Irritable bowel syndrome with diarrhea: Secondary | ICD-10-CM | POA: Diagnosis not present

## 2023-05-11 DIAGNOSIS — K219 Gastro-esophageal reflux disease without esophagitis: Secondary | ICD-10-CM | POA: Diagnosis not present

## 2023-05-11 DIAGNOSIS — M150 Primary generalized (osteo)arthritis: Secondary | ICD-10-CM | POA: Diagnosis not present

## 2023-05-11 DIAGNOSIS — R2689 Other abnormalities of gait and mobility: Secondary | ICD-10-CM | POA: Diagnosis not present

## 2023-05-11 DIAGNOSIS — E1169 Type 2 diabetes mellitus with other specified complication: Secondary | ICD-10-CM | POA: Diagnosis not present

## 2023-05-11 DIAGNOSIS — E782 Mixed hyperlipidemia: Secondary | ICD-10-CM

## 2023-05-11 NOTE — Patient Instructions (Addendum)
Miralax 17 gm half dose-full dose daily Making sure she is getting enough fiber- benefiber is a good option.    Make sure you tell eye doctor you are diabetic  Follow up with GI due to ongoing stomach and swallowing issues

## 2023-05-11 NOTE — Progress Notes (Signed)
Careteam: Patient Care Team: [name], NP as PCP - General (Geriatric Medicine) Shea Evans Genice Rouge (Optometry)   PLACE OF SERVICE: [facility]  Advanced Directive information Does Patient Have a Medical Advance Directive?: Yes, Type of Advance Directive: Out of facility DNR (pink MOST or yellow form), Pre-existing out of facility DNR order (yellow form or pink MOST form): Yellow form placed in chart (order not valid for inpatient use);Pink MOST form placed in chart (order not valid for inpatient use), Does patient want to make changes to medical advance directive?: No - Patient declined   Allergies  Allergen Reactions   Amoxicillin Diarrhea     Chief Complaint  Patient presents with   Medical Management of Chronic Issues    6 month follow-up. Discuss need for td/tdap, flu vaccine (declined vaccine recommendations), A1c, foot exam, and eye exam (request sent to eye doctor)       HPI: Patient is a 83 y.o. female presents for 6 month follow-up.  Patient reports having trouble swallowing, feels like she has a lot of mucous and like food gets stuck (specifically "dry foods"). Daughter is worried she doesn't have enough saliva. Taking Nexium for GERD.  She had swallow study done in 2021 which noted moderate presbyesophagus.  Stomach has been more upset with diarrhea and stomachaches lately despite what she eats. Daughter reports these symptoms are worse due to stress. Wonders if she has developed intolerance to gluten or dairy but has not trying cutting  anything out and she eats whatever she wants. However, she hasn't been eating as much food due to anxiety these symptoms are producing.   DM- new diagnosis- Eats a lot of carbohydrates, usually has a donut for breakfast. However, not drinking as much soda. Eye exam in Nov. 2024. No podiatrist.   Complaints of dry mouth as well. Urgency/frequency at baseline.   OA- Cortisone shot in shoulder on left side, feels like she will need to get  another one soon. Still taking meloxicam, daughter states she can feel the difference if she doesn't take it. Reports taking with food. Hx of GI bleed, saw [facility] for this. Aware of risk of NSAIDs but due to QOL wants to take.  Had fall few months ago, but no recent falls, reports gait is a little unsteady. House is set up for fall prevention. She is no longer driving. Lives with her daughter, helps with the laundry.   Somewhat more forgetful but no safety issues reported. She lives with her daughter.   Review of Systems:  Review of Systems  Constitutional:  Negative for chills, fever and weight loss.  Respiratory:  Positive for cough (clearing after eating) and sputum production. Negative for shortness of breath.   Cardiovascular:  Negative for chest pain, palpitations and leg swelling.  Gastrointestinal:  Positive for diarrhea. Negative for blood in stool, constipation, nausea and vomiting.  Genitourinary:  Positive for frequency and urgency. Negative for dysuria.  Musculoskeletal:  Positive for joint pain. Negative for myalgias.  Neurological:  Negative for dizziness, weakness and headaches.  Psychiatric/Behavioral:  Positive for memory loss. Negative for depression. The patient is not nervous/anxious and does not have insomnia.     Past Medical History:  Diagnosis Date   Anxiety and depression    Cataract    Diabetes (HCC)    Eye abnormality    film over eye that was not cataract   Fall    GERD (gastroesophageal reflux disease)    Hyperlipemia    Hypertension  Osteoarthritis    Thyroid disease     Past Surgical History:  Procedure Laterality Date   ABDOMINAL HYSTERECTOMY  1978   BREAST LUMPECTOMY  1980   SALIVARY STONE REMOVAL  1980's   TONSILLECTOMY  1950's     Social History:   reports that she has never smoked. She has never used smokeless tobacco. She reports that she does not drink alcohol and does not use drugs.  Family History  Problem Relation Age of Onset    Hypertension Mother    Heart disease Mother    Heart attack Father 64   Heart disease Father    Heart attack Sister    Arthritis Sister    Diabetes Brother      Medications:  Patient's Medications  New Prescriptions   No medications on file  Previous Medications   ACETAMINOPHEN (TYLENOL) 500 MG TABLET    Take 500 mg by mouth as needed.   ASPIRIN EC 81 MG TABLET    Take 81 mg by mouth daily.   CHOLECALCIFEROL (VITAMIN D3) 2000 UNITS TABS    Take 1 tablet by mouth daily.   DICYCLOMINE (BENTYL) 20 MG TABLET    Take 1 tablet (20 mg total) by mouth 2 (two) times daily.   DULOXETINE (CYMBALTA) 60 MG CAPSULE    TAKE 1 CAPSULE EVERY DAY   ESOMEPRAZOLE (NEXIUM) 40 MG CAPSULE    Take 40 mg by mouth daily at 12 noon.   LISINOPRIL-HYDROCHLOROTHIAZIDE (ZESTORETIC) 10-12.5 MG TABLET    TAKE 1 TABLET EVERY DAY   LOPERAMIDE (IMODIUM A-D) 2 MG TABLET    Take 1 tablet (2 mg total) by mouth 3 (three) times daily as needed for diarrhea or loose stools (for diarrhea).   MELOXICAM (MOBIC) 7.5 MG TABLET    TAKE 1 TABLET EVERY DAY AS NEEDED FOR PAIN   METOPROLOL SUCCINATE (TOPROL-XL) 50 MG 24 HR TABLET    TAKE 1 TABLET EVERY DAY WITH OR IMMEDIATELY FOLLOWING A MEAL.   ROSUVASTATIN (CRESTOR) 5 MG TABLET    TAKE 1 TABLET THREE TIMES WEEKLY  Modified Medications   No medications on file  Discontinued Medications   No medications on file     Physical Exam:  Vitals:   05/11/23 1049  BP: 136/82  Pulse: 63  Temp: 98.7 F (37.1 C)  TempSrc: Temporal  SpO2: 97%  Weight: 197 lb 9.6 oz (89.6 kg)  Height: 5\' 4"  (1.626 m)   Body mass index is 33.92 kg/m.  Wt Readings from Last 3 Encounters:  05/11/23 197 lb 9.6 oz (89.6 kg)  03/12/23 205 lb (93 kg)  11/06/22 200 lb (90.7 kg)     Physical Exam Constitutional:      General: She is not in acute distress.    Appearance: Normal appearance. She is not ill-appearing or toxic-appearing.  Cardiovascular:     Rate and Rhythm: Normal rate and regular rhythm.      Pulses: Normal pulses.          Dorsalis pedis pulses are 2+ on the right side and 2+ on the left side.     Heart sounds: Normal heart sounds.  Pulmonary:     Effort: Pulmonary effort is normal.     Breath sounds: Normal breath sounds.  Abdominal:     General: Bowel sounds are normal.     Palpations: Abdomen is soft.  Musculoskeletal:        General: No swelling.  Skin:    General: Skin is warm and dry.  Capillary Refill: Capillary refill takes more than 3 seconds. toes    Comments: Blue-tinge on all toes bilaterally  Neurological:     General: No focal deficit present.     Mental Status: She is alert and oriented to person, place, and time.  Psychiatric:        Mood and Affect: Mood normal.        Behavior: Behavior normal.     Labs reviewed: Basic Metabolic Panel:  Recent Labs    06/02/22 0000 08/11/22 1555  NA 139 139  K 4.4 4.1  CL 107 105  CO2 23 22  GLUCOSE 110* 131*  BUN 25 20  CREATININE 0.71 0.75  CALCIUM 9.8 9.5   Liver Function Tests:  Recent Labs    06/02/22 0000  AST 15  ALT 12  BILITOT 0.5  PROT 6.5   No results for input(s): "LIPASE", "AMYLASE" in the last 8760 hours. No results for input(s): "AMMONIA" in the last 8760 hours. CBC:  Recent Labs    08/11/22 1555 08/21/22 1339 11/07/22 1405  WBC 16.4* 12.6* 12.0*  NEUTROABS 9,938* 6,325 5,772  HGB 15.3 14.9 15.2  HCT 45.9* 45.8* 46.9*  MCV 87.3 89.5 89.7  PLT 291 321 302   Lipid Panel: No results for input(s): "CHOL", "HDL", "LDLCALC", "TRIG", "CHOLHDL", "LDLDIRECT" in the last 8760 hours. TSH: No results for input(s): "TSH" in the last 8760 hours. A1C:  Lab Results  Component Value Date   HGBA1C 6.5 (H) 11/07/2022     Assessment/Plan   1. Essential hypertension (Primary) -Controlled, at goal, <140/90 -Continue current medications -Encouraged dietary modifications and physical activity as tolerated - COMPLETE METABOLIC PANEL WITH GFR - CBC with Differential/Platelet  2. Type  2 diabetes mellitus with other specified complication, without long-term current use of insulin (HCC) - Last A1c 6.5, will recheck  - Hemoglobin A1c - Encouraged daily foot inspections, yearly eye exams (notify ophthalmologist of diabetes diagnosis); will notify if she needs referral for podiatrist  3. Gastroesophageal reflux disease without esophagitis -Continue Nexium -Encouraged follow-up with GI for swallowing difficulty- she had swallow eval by GI in the past.  Also discussed some of GI symptoms could be coming from NSAID  4. Primary osteoarthritis involving multiple joints -Educated on use of Meloxicam and risk of GI bleed -- patient would like to continue Meloxicam because otherwise she is in too much pain -Has declined PT in the past, does not find it useful  -Follows with ortho for cortisone injections  5. Mixed hyperlipidemia -Continue rosuvastatin -Encouraged dietary modifications and physical activity as tolerated - Lipid panel  6. Imbalance -Fall precautions, no recent falls  7. Irritable bowel syndrome with diarrhea -Encouraged follow-up with GI -Encouraged Benefiber and proper nutrition -Consider Miralax 17 gm, half/full dose if concerns for constipation  Return in about 6 months (around 11/08/2023) for routine follow up.  Rollen Sox, Haroldine Laws MSN-FNP Student -I personally was present during the history, physical exam and medical decision-making activities of this service and have verified that the service and findings are accurately documented in the student's note Verdell Dykman K. Biagio Borg [facility] [phone]

## 2023-05-12 ENCOUNTER — Encounter: Payer: Self-pay | Admitting: Nurse Practitioner

## 2023-05-12 LAB — CBC WITH DIFFERENTIAL/PLATELET
Absolute Lymphocytes: 3619 {cells}/uL (ref 850–3900)
Absolute Monocytes: 662 {cells}/uL (ref 200–950)
Basophils Absolute: 96 {cells}/uL (ref 0–200)
Basophils Relative: 1 %
Eosinophils Absolute: 240 {cells}/uL (ref 15–500)
Eosinophils Relative: 2.5 %
HCT: 46.5 % — ABNORMAL HIGH (ref 35.0–45.0)
Hemoglobin: 15 g/dL (ref 11.7–15.5)
MCH: 29.6 pg (ref 27.0–33.0)
MCHC: 32.3 g/dL (ref 32.0–36.0)
MCV: 91.9 fL (ref 80.0–100.0)
MPV: 11.3 fL (ref 7.5–12.5)
Monocytes Relative: 6.9 %
Neutro Abs: 4982 {cells}/uL (ref 1500–7800)
Neutrophils Relative %: 51.9 %
Platelets: 299 10*3/uL (ref 140–400)
RBC: 5.06 10*6/uL (ref 3.80–5.10)
RDW: 13.2 % (ref 11.0–15.0)
Total Lymphocyte: 37.7 %
WBC: 9.6 10*3/uL (ref 3.8–10.8)

## 2023-05-12 LAB — COMPLETE METABOLIC PANEL WITH GFR
AG Ratio: 2 (calc) (ref 1.0–2.5)
ALT: 14 U/L (ref 6–29)
AST: 15 U/L (ref 10–35)
Albumin: 4.1 g/dL (ref 3.6–5.1)
Alkaline phosphatase (APISO): 167 U/L — ABNORMAL HIGH (ref 37–153)
BUN: 20 mg/dL (ref 7–25)
CO2: 22 mmol/L (ref 20–32)
Calcium: 10.1 mg/dL (ref 8.6–10.4)
Chloride: 105 mmol/L (ref 98–110)
Creat: 0.7 mg/dL (ref 0.60–0.95)
Globulin: 2.1 g/dL (ref 1.9–3.7)
Glucose, Bld: 113 mg/dL — ABNORMAL HIGH (ref 65–99)
Potassium: 4.3 mmol/L (ref 3.5–5.3)
Sodium: 139 mmol/L (ref 135–146)
Total Bilirubin: 0.7 mg/dL (ref 0.2–1.2)
Total Protein: 6.2 g/dL (ref 6.1–8.1)
eGFR: 86 mL/min/{1.73_m2} (ref >=60)

## 2023-05-12 LAB — LIPID PANEL
Cholesterol: 164 mg/dL (ref <200)
HDL: 59 mg/dL (ref >=50)
LDL Cholesterol (Calc): 77 mg/dL
Non-HDL Cholesterol (Calc): 105 mg/dL (ref <130)
Total CHOL/HDL Ratio: 2.8 (calc) (ref <5.0)
Triglycerides: 180 mg/dL — ABNORMAL HIGH (ref <150)

## 2023-05-12 LAB — HEMOGLOBIN A1C
Hgb A1c MFr Bld: 6.4 %{Hb} — ABNORMAL HIGH (ref <5.7)
Mean Plasma Glucose: 137 mg/dL
eAG (mmol/L): 7.6 mmol/L

## 2023-05-25 ENCOUNTER — Other Ambulatory Visit: Payer: Self-pay | Admitting: Nurse Practitioner

## 2023-11-09 ENCOUNTER — Encounter: Payer: Self-pay | Admitting: Nurse Practitioner

## 2023-11-09 ENCOUNTER — Ambulatory Visit (INDEPENDENT_AMBULATORY_CARE_PROVIDER_SITE_OTHER): Payer: Medicare HMO | Admitting: Nurse Practitioner

## 2023-11-09 VITALS — BP 136/88 | HR 58 | Temp 97.5°F | Resp 9 | Ht 64.0 in | Wt 199.6 lb

## 2023-11-09 DIAGNOSIS — K580 Irritable bowel syndrome with diarrhea: Secondary | ICD-10-CM | POA: Diagnosis not present

## 2023-11-09 DIAGNOSIS — E559 Vitamin D deficiency, unspecified: Secondary | ICD-10-CM | POA: Diagnosis not present

## 2023-11-09 DIAGNOSIS — K219 Gastro-esophageal reflux disease without esophagitis: Secondary | ICD-10-CM

## 2023-11-09 DIAGNOSIS — E782 Mixed hyperlipidemia: Secondary | ICD-10-CM | POA: Diagnosis not present

## 2023-11-09 DIAGNOSIS — M150 Primary generalized (osteo)arthritis: Secondary | ICD-10-CM | POA: Diagnosis not present

## 2023-11-09 DIAGNOSIS — I10 Essential (primary) hypertension: Secondary | ICD-10-CM | POA: Diagnosis not present

## 2023-11-09 DIAGNOSIS — F339 Major depressive disorder, recurrent, unspecified: Secondary | ICD-10-CM | POA: Diagnosis not present

## 2023-11-09 DIAGNOSIS — E1169 Type 2 diabetes mellitus with other specified complication: Secondary | ICD-10-CM | POA: Diagnosis not present

## 2023-11-09 MED ORDER — LISINOPRIL 20 MG PO TABS: 20.0000 mg | ORAL_TABLET | Freq: Every day | ORAL | 1 refills | Status: AC

## 2023-11-09 NOTE — Progress Notes (Signed)
 Careteam: Patient Care Team: Caro Harlene POUR, NP as PCP - General (Geriatric Medicine) Abigail Maude POUR [facility])  PLACE OF SERVICE:  [facility]  Advanced Directive information    Allergies  Allergen Reactions   Amoxicillin Diarrhea    Chief Complaint  Patient presents with   Medical Management of Chronic Issues    6 month follow up // pt stated she's very tired     HPI:  Discussed the use of AI scribe software for clinical note transcription with the patient, who gave verbal consent to proceed.  History of Present Illness April Hill is an 83 year old female with diabetes and hypertension who presents for a six-month follow-up visit.  She has diabetes, which is currently not being treated with medication. Her A1c levels have been monitored in the past. She experiences frequent tiredness, and her daughter is concerned about possible anemia. She has lost about 12 pounds but has regained 10 pounds recently, attributing these changes to her fluctuating eating habits.  She experiences urinary incontinence, describing an inability to hold urine, leading to frequent use of pads and paper pants. She is on lisinopril  hydrochlorothiazide , which includes a diuretic component. The incontinence affects her ability to go out, as she often has accidents before reaching a restroom.  She has a history of bowel incontinence, with less frequent but more severe accidents. Her stomach often feels unwell after eating, but she has not had as many accidents recently. She is not currently taking any medication for her bowels. No recent increase in diarrhea or constipation.  She is on metoprolol  50 mg once daily for blood pressure management and has a large supply of this medication. She also takes Nexium  daily for acid reflux, which she believes contributes to mucus in her throat. She is on meloxicam  for arthritis pain, which she sometimes forgets to take at night, leading to stiffness in the  morning.  She has high cholesterol, managed with rosuvastatin , which has kept her levels in a good range. Her cholesterol was last checked in February and was within the target range. She experiences shortness of breath when outside in the heat or after exertion, such as cleaning up after a bowel accident.  She lives with her daughter, grandson, and darra, representing four generations in one household. She used to enjoy sunbathing but now finds the heat overwhelming and prefers to stay indoors.  ***  Review of Systems:  ROS***  Past Medical History:  Diagnosis Date   Anxiety and depression    Cataract    Diabetes (HCC)    Eye abnormality    film over eye that was not cataract   Fall    GERD (gastroesophageal reflux disease)    Hyperlipemia    Hypertension    Osteoarthritis    Thyroid  disease    Past Surgical History:  Procedure Laterality Date   ABDOMINAL HYSTERECTOMY  1978   BREAST LUMPECTOMY  1980   SALIVARY STONE REMOVAL  1980's   TONSILLECTOMY  1950's   Social History:   reports that she has never smoked. She has never used smokeless tobacco. She reports that she does not drink alcohol and does not use drugs.  Family History  Problem Relation Age of Onset   Hypertension Mother    Heart disease Mother    Heart attack Father 29   Heart disease Father    Heart attack Sister    Arthritis Sister    Diabetes Brother     Medications:  Patient's Medications  New Prescriptions   No medications on file  Previous Medications   ACETAMINOPHEN (TYLENOL) 500 MG TABLET    Take 500 mg by mouth as needed.   ASPIRIN EC 81 MG TABLET    Take 81 mg by mouth daily.   CHOLECALCIFEROL (VITAMIN D3) 2000 UNITS TABS    Take 1 tablet by mouth daily.   DICYCLOMINE  (BENTYL ) 20 MG TABLET    TAKE 1 TABLET TWICE DAILY   DULOXETINE  (CYMBALTA ) 60 MG CAPSULE    TAKE 1 CAPSULE EVERY DAY   ESOMEPRAZOLE  (NEXIUM ) 40 MG CAPSULE    Take 40 mg by mouth daily at 12 noon.    LISINOPRIL -HYDROCHLOROTHIAZIDE  (ZESTORETIC ) 10-12.5 MG TABLET    TAKE 1 TABLET EVERY DAY   LOPERAMIDE  (IMODIUM  A-D) 2 MG TABLET    Take 1 tablet (2 mg total) by mouth 3 (three) times daily as needed for diarrhea or loose stools (for diarrhea).   MELOXICAM  (MOBIC ) 7.5 MG TABLET    TAKE 1 TABLET EVERY DAY AS NEEDED FOR PAIN   METOPROLOL  SUCCINATE (TOPROL -XL) 50 MG 24 HR TABLET    TAKE 1 TABLET EVERY DAY WITH OR IMMEDIATELY FOLLOWING A MEAL.   ROSUVASTATIN  (CRESTOR ) 5 MG TABLET    TAKE 1 TABLET THREE TIMES WEEKLY  Modified Medications   No medications on file  Discontinued Medications   No medications on file    Physical Exam:  Vitals:   11/09/23 1019  BP: 136/88  Pulse: (!) 58  Resp: (!) 9  Temp: (!) 97.5 F (36.4 C)  SpO2: 96%  Weight: 199 lb 9.6 oz (90.5 kg)  Height: 5' 4 (1.626 m)   Body mass index is 34.26 kg/m. Wt Readings from Last 3 Encounters:  11/09/23 199 lb 9.6 oz (90.5 kg)  05/11/23 197 lb 9.6 oz (89.6 kg)  03/12/23 205 lb (93 kg)    Physical Exam***  Labs reviewed: Basic Metabolic Panel: Recent Labs    05/11/23 1146  NA 139  K 4.3  CL 105  CO2 22  GLUCOSE 113*  BUN 20  CREATININE 0.70  CALCIUM  10.1   Liver Function Tests: Recent Labs    05/11/23 1146  AST 15  ALT 14  BILITOT 0.7  PROT 6.2   No results for input(s): LIPASE, AMYLASE in the last 8760 hours. No results for input(s): AMMONIA in the last 8760 hours. CBC: Recent Labs    05/11/23 1146  WBC 9.6  NEUTROABS 4,982  HGB 15.0  HCT 46.5*  MCV 91.9  PLT 299   Lipid Panel: Recent Labs    05/11/23 1146  CHOL 164  HDL 59  LDLCALC 77  TRIG 180*  CHOLHDL 2.8   TSH: No results for input(s): TSH in the last 8760 hours. A1C: Lab Results  Component Value Date   HGBA1C 6.4 (H) 05/11/2023     Assessment/Plan *** There are no diagnoses linked to this encounter.   No follow-ups on file.: ***  Donyea Gafford K. Caro BODILY [facility] [phone]

## 2023-11-09 NOTE — Patient Instructions (Addendum)
 STOP lisinopril - hydrochlorothiazide   START PLAIN lisinopril .   Once you start new medication take bp twice weekly for a few weeks to make sure blood pressure is staying at goal  <140/90   To take Loratadine  or cetrizine (generic for Claritin  or zyrtec) 10 mg by mouth daily for allergies.

## 2023-11-10 ENCOUNTER — Ambulatory Visit: Payer: Self-pay | Admitting: Nurse Practitioner

## 2023-11-10 LAB — CBC WITH DIFFERENTIAL/PLATELET
Absolute Lymphocytes: 4055 {cells}/uL — ABNORMAL HIGH (ref 850–3900)
Absolute Monocytes: 660 {cells}/uL (ref 200–950)
Basophils Absolute: 87 {cells}/uL (ref 0–200)
Basophils Relative: 0.9 %
Eosinophils Absolute: 359 {cells}/uL (ref 15–500)
Eosinophils Relative: 3.7 %
HCT: 47.2 % — ABNORMAL HIGH (ref 35.0–45.0)
Hemoglobin: 15.3 g/dL (ref 11.7–15.5)
MCH: 29.4 pg (ref 27.0–33.0)
MCHC: 32.4 g/dL (ref 32.0–36.0)
MCV: 90.8 fL (ref 80.0–100.0)
MPV: 11.2 fL (ref 7.5–12.5)
Monocytes Relative: 6.8 %
Neutro Abs: 4540 {cells}/uL (ref 1500–7800)
Neutrophils Relative %: 46.8 %
Platelets: 286 Thousand/uL (ref 140–400)
RBC: 5.2 Million/uL — ABNORMAL HIGH (ref 3.80–5.10)
RDW: 13.3 % (ref 11.0–15.0)
Total Lymphocyte: 41.8 %
WBC: 9.7 Thousand/uL (ref 3.8–10.8)

## 2023-11-10 LAB — COMPREHENSIVE METABOLIC PANEL WITH GFR
AG Ratio: 1.9 (calc) (ref 1.0–2.5)
ALT: 12 U/L (ref 6–29)
AST: 15 U/L (ref 10–35)
Albumin: 4.1 g/dL (ref 3.6–5.1)
Alkaline phosphatase (APISO): 165 U/L — ABNORMAL HIGH (ref 37–153)
BUN: 24 mg/dL (ref 7–25)
CO2: 26 mmol/L (ref 20–32)
Calcium: 9.8 mg/dL (ref 8.6–10.4)
Chloride: 104 mmol/L (ref 98–110)
Creat: 0.82 mg/dL (ref 0.60–0.95)
Globulin: 2.2 g/dL (ref 1.9–3.7)
Glucose, Bld: 105 mg/dL — ABNORMAL HIGH (ref 65–99)
Potassium: 4.8 mmol/L (ref 3.5–5.3)
Sodium: 139 mmol/L (ref 135–146)
Total Bilirubin: 0.7 mg/dL (ref 0.2–1.2)
Total Protein: 6.3 g/dL (ref 6.1–8.1)
eGFR: 70 mL/min/1.73m2 (ref >=60)

## 2023-11-10 LAB — MICROALBUMIN / CREATININE URINE RATIO
Creatinine, Urine: 158 mg/dL (ref 20–275)
Microalb Creat Ratio: 4 mg/g{creat} (ref <30)
Microalb, Ur: 0.6 mg/dL

## 2023-11-10 LAB — HEMOGLOBIN A1C
Hgb A1c MFr Bld: 6.5 % — ABNORMAL HIGH (ref <5.7)
Mean Plasma Glucose: 140 mg/dL
eAG (mmol/L): 7.7 mmol/L

## 2023-11-10 NOTE — Assessment & Plan Note (Signed)
 Continues on vit 2000 units

## 2023-11-10 NOTE — Assessment & Plan Note (Signed)
 Ongoing, limiting mobility.  Continues on mobic , takes daily, aware of side effects and risks

## 2023-11-10 NOTE — Assessment & Plan Note (Signed)
 Encouraged dietary compliance, routine foot care/monitoring and to keep up with diabetic eye exams through ophthalmology

## 2023-11-10 NOTE — Assessment & Plan Note (Signed)
 Stable on crestor  5 mg by mouth daily

## 2023-11-10 NOTE — Assessment & Plan Note (Signed)
Controlled on nexium.  

## 2023-11-10 NOTE — Assessment & Plan Note (Signed)
 Stable on cymbalta  60 mg by mouth daily

## 2023-11-10 NOTE — Assessment & Plan Note (Signed)
 Increase in urination, will stop hydrochlorothiazide  Start lisinipril 20 mg daily  Monitor BP at home Goal <140/90

## 2023-11-10 NOTE — Assessment & Plan Note (Signed)
 Stable continues on bentyl  BID PRN

## 2023-11-25 ENCOUNTER — Other Ambulatory Visit: Payer: Self-pay | Admitting: Nurse Practitioner

## 2023-11-25 DIAGNOSIS — E782 Mixed hyperlipidemia: Secondary | ICD-10-CM

## 2023-12-03 ENCOUNTER — Telehealth: Payer: Self-pay | Admitting: *Deleted

## 2023-12-03 ENCOUNTER — Ambulatory Visit: Admitting: Nurse Practitioner

## 2023-12-03 ENCOUNTER — Encounter: Payer: Self-pay | Admitting: Nurse Practitioner

## 2023-12-03 DIAGNOSIS — Z0000 Encounter for general adult medical examination without abnormal findings: Secondary | ICD-10-CM | POA: Diagnosis not present

## 2023-12-03 NOTE — Progress Notes (Signed)
 Subjective:   April Hill is a 83 y.o. female who presents for Medicare Annual (Subsequent) preventive examination.  Visit Complete: Virtual I connected with  April Hill on 12/03/23 by a video and audio enabled telemedicine application and verified that I am speaking with the correct person using two identifiers.  Patient Location: Home  Provider Location: Office/Clinic  I discussed the limitations of evaluation and management by telemedicine. The patient expressed understanding and agreed to proceed.  Vital Signs: Because this visit was a virtual/telehealth visit, some criteria may be missing or patient reported. Any vitals not documented were not able to be obtained and vitals that have been documented are patient reported.    Cardiac Risk Factors include: diabetes mellitus;dyslipidemia;hypertension;sedentary lifestyle;obesity (BMI >30kg/m2)     Objective:    There were no vitals filed for this visit. There is no height or weight on file to calculate BMI.     12/03/2023   10:10 AM 05/11/2023   10:49 AM 03/12/2023   10:37 AM 11/06/2022    4:25 PM 08/22/2022   10:32 AM 06/02/2022   10:12 AM 11/25/2021    8:56 AM  Advanced Directives  Does Patient Have a Medical Advance Directive? Yes Yes Yes Yes Yes Yes Yes  Type of Estate agent of [place];Living will;Out of facility DNR (pink MOST or yellow form) Out of facility DNR (pink MOST or yellow form) Healthcare Power of [place];Living will;Out of facility DNR (pink MOST or yellow form) Out of facility DNR (pink MOST or yellow form) Out of facility DNR (pink MOST or yellow form) Healthcare Power of [place];Living will;Out of facility DNR (pink MOST or yellow form) Out of facility DNR (pink MOST or yellow form)  Does patient want to make changes to medical advance directive? No - Patient declined No - Patient declined No - Patient declined No - Patient declined No - Patient declined No - Patient declined No - Patient  declined  Copy of Healthcare Power of Attorney in Chart? No - copy requested Yes - validated most recent copy scanned in chart (See row information) No - copy requested   Yes - validated most recent copy scanned in chart (See row information)   Pre-existing out of facility DNR order (yellow form or pink MOST form)  Yellow form placed in chart (order not valid for inpatient use);Pink MOST form placed in chart (order not valid for inpatient use)  Yellow form placed in chart (order not valid for inpatient use);Pink MOST form placed in chart (order not valid for inpatient use) Pink MOST form placed in chart (order not valid for inpatient use);Yellow form placed in chart (order not valid for inpatient use)  Yellow form placed in chart (order not valid for inpatient use);Pink MOST form placed in chart (order not valid for inpatient use)    Current Medications (verified) Outpatient Encounter Medications as of 12/03/2023  Medication Sig   acetaminophen (TYLENOL) 500 MG tablet Take 500 mg by mouth as needed.   aspirin EC 81 MG tablet Take 81 mg by mouth daily.   Cholecalciferol (VITAMIN D3) 2000 units TABS Take 1 tablet by mouth daily.   dicyclomine  (BENTYL ) 20 MG tablet TAKE 1 TABLET TWICE DAILY   DULoxetine  (CYMBALTA ) 60 MG capsule TAKE 1 CAPSULE EVERY DAY   esomeprazole  (NEXIUM ) 40 MG capsule Take 40 mg by mouth daily at 12 noon.   lisinopril  (ZESTRIL ) 20 MG tablet Take 1 tablet (20 mg total) by mouth daily.   loperamide  (IMODIUM  A-D) 2  MG tablet Take 1 tablet (2 mg total) by mouth 3 (three) times daily as needed for diarrhea or loose stools (for diarrhea).   meloxicam  (MOBIC ) 7.5 MG tablet TAKE 1 TABLET EVERY DAY AS NEEDED FOR PAIN   metoprolol  succinate (TOPROL -XL) 50 MG 24 hr tablet TAKE 1 TABLET EVERY DAY WITH OR IMMEDIATELY FOLLOWING A MEAL.   rosuvastatin  (CRESTOR ) 5 MG tablet TAKE 1 TABLET THREE TIMES WEEKLY   No facility-administered encounter medications on file as of 12/03/2023.    Allergies  (verified) Amoxicillin   History: Past Medical History:  Diagnosis Date   Anxiety and depression    Cataract    Diabetes (HCC)    Eye abnormality    film over eye that was not cataract   Fall    GERD (gastroesophageal reflux disease)    Hyperlipemia    Hypertension    Osteoarthritis    Thyroid  disease    Past Surgical History:  Procedure Laterality Date   ABDOMINAL HYSTERECTOMY  1978   BREAST LUMPECTOMY  1980   SALIVARY STONE REMOVAL  1980's   TONSILLECTOMY  1950's   Family History  Problem Relation Age of Onset   Hypertension Mother    Heart disease Mother    Heart attack Father 11   Heart disease Father    Heart attack Sister    Arthritis Sister    Diabetes Brother    Social History   Socioeconomic History   Marital status: Widowed    Spouse name: Not on file   Number of children: Not on file   Years of education: Not on file   Highest education level: 12th grade  Occupational History   Not on file  Tobacco Use   Smoking status: Never   Smokeless tobacco: Never  Vaping Use   Vaping status: Never Used  Substance and Sexual Activity   Alcohol use: No   Drug use: No   Sexual activity: Not Currently  Other Topics Concern   Not on file  Social History Narrative   Social History      Diet? Off and on      Do you drink/eat things with caffeine? yes      Marital status?  married                                  What year were you married? 1960      Do you live in a house, apartment, assisted living, condo, trailer, etc.? House with daughter      Is it one or more stories? 2-we live in basement apt      How many persons live in your home? 4      Do you have any pets in your home? (please list) cat      Highest level of education completed? High school      Current or past profession: Diplomatic Services operational officer- cashier-hostess      Do you exercise?         no                             Type & how often? --      Advanced Directives      Do you have a living  will? yes      Do you have a DNR form?       yes  If not, do you want to discuss one?      Do you have signed POA/HPOA for forms? yes      Functional Status      Do you have difficulty bathing or dressing yourself? no      Do you have difficulty preparing food or eating? no      Do you have difficulty managing your medications? no      Do you have difficulty managing your finances? no      Do you have difficulty affording your medications? no   Social Drivers of Corporate investment banker Strain: Low Risk  (11/06/2023)   Overall Financial Resource Strain (CARDIA)    Difficulty of Paying Living Expenses: Not hard at all  Food Insecurity: No Food Insecurity (11/06/2023)   Hunger Vital Sign    Worried About Running Out of Food in the Last Year: Never true    Ran Out of Food in the Last Year: Never true  Transportation Needs: No Transportation Needs (11/06/2023)   PRAPARE - Administrator, Civil Service (Medical): No    Lack of Transportation (Non-Medical): No  Physical Activity: Inactive (11/06/2023)   Exercise Vital Sign    Days of Exercise per Week: 0 days    Minutes of Exercise per Session: Not on file  Stress: Stress Concern Present (11/06/2023)   [employer] of Occupational Health - Occupational Stress Questionnaire    Feeling of Stress: To some extent  Social Connections: Moderately Isolated (11/06/2023)   Social Connection and Isolation Panel    Frequency of Communication with Friends and Family: More than three times a week    Frequency of Social Gatherings with Friends and Family: More than three times a week    Attends Religious Services: More than 4 times per year    Active Member of [employer] or Organizations: No    Attends Banker Meetings: Not on file    Marital Status: Widowed    Tobacco Counseling Counseling given: Not Answered   Clinical Intake:  Pre-visit preparation completed: Yes  Pain : No/denies  pain     BMI - recorded: 34 Nutritional Status: BMI > 30  Obese Nutritional Risks: None Diabetes: No  How often do you need to have someone help you when you read instructions, pamphlets, or other written materials from your doctor or pharmacy?: 3 - Sometimes         Activities of Daily Living    12/03/2023   10:16 AM  In your present state of health, do you have any difficulty performing the following activities:  Hearing? 1  Vision? 1  Difficulty concentrating or making decisions? 1  Walking or climbing stairs? 1  Dressing or bathing? 1  Comment trouble due to OA in arms  Doing errands, shopping? 1  Preparing Food and eating ? N  Using the Toilet? N  In the past six months, have you accidently leaked urine? Y  Do you have problems with loss of bowel control? Y  Comment occasionally  Managing your Medications? N  Managing your Finances? N  Housekeeping or managing your Housekeeping? N    Patient Care Team: Caro Harlene POUR, NP as PCP - General (Geriatric Medicine) Abigail, Maude POUR [facility])  Indicate any recent Medical Services you may have received from other than [facility] providers in the past year (date may be approximate).     Assessment:   This is a routine wellness examination for [place].  Hearing/Vision screen  Vision Screening - Comments:: Dr. Maude Bring Last Exam: 09/2023   Goals Addressed   None    Depression Screen    12/03/2023   10:08 AM 11/09/2023   10:16 AM 05/11/2023   10:57 AM 03/12/2023   10:36 AM 11/07/2022    1:17 PM 08/11/2022    3:34 PM 11/25/2021   10:02 AM  PHQ 2/9 Scores  PHQ - 2 Score 0 0 0 0 0 0 0  PHQ- 9 Score  0    0     Fall Risk    12/03/2023   10:08 AM 05/11/2023   10:56 AM 03/12/2023   10:36 AM 11/07/2022    1:17 PM 08/22/2022   10:26 AM  Fall Risk   Falls in the past year? 1 1 0 0 0  Number falls in past yr: 1 0 0 0 0  Comment    Stumbles a lot   Injury with Fall? 0 0 0 0 0  Risk for fall due to : No Fall Risks No Fall Risks  No Fall Risks No Fall Risks No Fall Risks  Follow up Falls evaluation completed Falls evaluation completed Falls evaluation completed      MEDICARE RISK AT HOME: Medicare Risk at Home Any stairs in or around the home?: Yes If so, are there any without handrails?: No Home free of loose throw rugs in walkways, pet beds, electrical cords, etc?: Yes Adequate lighting in your home to reduce risk of falls?: Yes Life alert?: No Use of a cane, walker or w/c?: No Grab bars in the bathroom?: Yes Shower chair or bench in shower?: Yes Elevated toilet seat or a handicapped toilet?: Yes  TIMED UP AND GO:  Was the test performed?  No    Cognitive Function:    08/08/2019    1:44 PM 05/22/2017    9:35 AM  MMSE - Mini Mental State Exam  Orientation to time 5 5   Orientation to Place 5 5   Registration 3 3   Attention/ Calculation 5 5   Recall 2 3   Language- name 2 objects 2 2   Language- repeat 1 1  Language- follow 3 step command 3 3   Language- read & follow direction 1 1   Write a sentence 1 1   Copy design 1 0   Total score 29 29      Data saved with a previous flowsheet row definition        12/03/2023   10:11 AM 08/22/2022   10:26 AM 08/15/2021   10:59 AM 08/09/2020   10:06 AM 08/05/2018    2:46 PM  6CIT Screen  What Year? 0 points 0 points 0 points 0 points 0 points  What month? 0 points 0 points 0 points 0 points 0 points  What time? 0 points 0 points 0 points 0 points 0 points  Count back from 20 0 points 0 points 0 points 0 points 0 points  Months in reverse 0 points 0 points 0 points 0 points 0 points  Repeat phrase 8 points 0 points 4 points 0 points 0 points  Total Score 8 points 0 points 4 points 0 points 0 points    Immunizations Immunization History  Administered Date(s) Administered   Fluad Quad(high Dose 65+) 01/30/2020   INFLUENZA, HIGH DOSE SEASONAL PF 01/20/2017, 12/08/2018, 01/29/2021   Influenza, Seasonal, Injecte, Preservative Fre 04/07/2010    Influenza-Unspecified 02/06/2016, 01/01/2018   Pneumococcal Conjugate-13 05/08/2014   Pneumococcal Polysaccharide-23 04/07/2009  Tdap 04/07/2012   Zoster Recombinant(Shingrix) 12/08/2018, 02/07/2019   Zoster, Live 05/08/2014    TDAP status: Due, Education has been provided regarding the importance of this vaccine. Advised may receive this vaccine at local pharmacy or Health Dept. Aware to provide a copy of the vaccination record if obtained from local pharmacy or Health Dept. Verbalized acceptance and understanding.  Flu Vaccine status: Up to date  Pneumococcal vaccine status: Up to date  Covid-19 vaccine status: Information provided on how to obtain vaccines.   Qualifies for Shingles Vaccine? Yes   Zostavax completed No   Shingrix Completed?: Yes  Screening Tests Health Maintenance  Topic Date Due   OPHTHALMOLOGY EXAM  Never done   DTaP/Tdap/Td (2 - Td or Tdap) 05/10/2024 (Originally 04/07/2022)   INFLUENZA VACCINE  07/05/2024 (Originally 11/06/2023)   FOOT EXAM  05/10/2024   HEMOGLOBIN A1C  05/11/2024   Diabetic kidney evaluation - eGFR measurement  11/08/2024   Diabetic kidney evaluation - Urine ACR  11/08/2024   Medicare Annual Wellness (AWV)  12/02/2024   Pneumococcal Vaccine: 50+ Years  Completed   Zoster Vaccines- Shingrix  Completed   HPV VACCINES  Aged Out   Meningococcal B Vaccine  Aged Out   DEXA SCAN  Discontinued   COVID-19 Vaccine  Discontinued    Health Maintenance  Health Maintenance Due  Topic Date Due   OPHTHALMOLOGY EXAM  Never done    Colorectal cancer screening: No longer required.   Mammogram status: No longer required due to age.   Lung Cancer Screening: (Low Dose CT Chest recommended if Age 77-80 years, 20 pack-year currently smoking OR have quit w/in 15years.) does not qualify.   Lung Cancer Screening Referral: na  Additional Screening:  Hepatitis C Screening: does not qualify; Completed na  Vision Screening: Recommended annual  ophthalmology exams for early detection of glaucoma and other disorders of the eye. Is the patient up to date with their annual eye exam?  Yes  Who is the provider or what is the name of the office in which the patient attends annual eye exams? Dr Abigail If pt is not established with a provider, would they like to be referred to a provider to establish care? No .   Dental Screening: Recommended annual dental exams for proper oral hygiene   Community Resource Referral / Chronic Care Management: CRR required this visit?  No   CCM required this visit?  No     Plan:     I have personally reviewed and noted the following in the patient's chart:   Medical and social history Use of alcohol, tobacco or illicit drugs  Current medications and supplements including opioid prescriptions. Patient is not currently taking opioid prescriptions. Functional ability and status Nutritional status Physical activity Advanced directives List of other physicians Hospitalizations, surgeries, and ER visits in previous 12 months Vitals Screenings to include cognitive, depression, and falls Referrals and appointments  In addition, I have reviewed and discussed with patient certain preventive protocols, quality metrics, and best practice recommendations. A written personalized care plan for preventive services as well as general preventive health recommendations were provided to patient.     Harlene MARLA An, NP   12/03/2023   After Visit Summary: (MyChart) Due to this being a telephonic visit, the after visit summary with patients personalized plan was offered to patient via MyChart

## 2023-12-03 NOTE — Progress Notes (Signed)
  This service is provided via telemedicine  No vital signs collected/recorded due to the encounter was a telemedicine visit.   Location of patient (ex: home, work):  Home  Patient consents to a telephone visit:  Yes  Location of the provider (ex: office, home):  Office [place].   Name of any referring provider:  na  Names of all persons participating in the telemedicine service and their role in the encounter:  [name], patient, Randine, daughter, Donzell Lavinia Mcneely, CMA, Harlene An, NP  Time spent on call:  7:11

## 2023-12-03 NOTE — Patient Instructions (Signed)
  April Hill , Thank you for taking time to come for your Medicare Wellness Visit. I appreciate your ongoing commitment to your health goals. Please review the following plan we discussed and let me know if I can assist you in the future.     This is a list of the screening recommended for you and due dates:  Health Maintenance  Topic Date Due   Eye exam for diabetics  Never done   DTaP/Tdap/Td vaccine (2 - Td or Tdap) 05/10/2024*   Flu Shot  07/05/2024*   Complete foot exam   05/10/2024   Hemoglobin A1C  05/11/2024   Yearly kidney function blood test for diabetes  11/08/2024   Yearly kidney health urinalysis for diabetes  11/08/2024   Medicare Annual Wellness Visit  12/02/2024   Pneumococcal Vaccine for age over 35  Completed   Zoster (Shingles) Vaccine  Completed   HPV Vaccine  Aged Out   Meningitis B Vaccine  Aged Out   DEXA scan (bone density measurement)  Discontinued   COVID-19 Vaccine  Discontinued  *Topic was postponed. The date shown is not the original due date.

## 2023-12-03 NOTE — Telephone Encounter (Signed)
 April Hill, April Hill are scheduled for a virtual visit with your provider today.    Just as we do with appointments in the office, we must obtain your consent to participate.  Your consent will be active for this visit and any virtual visit you [name] have with one of our providers in the next 365 days.    If you have a MyChart account, I can also send a copy of this consent to you electronically.  All virtual visits are billed to your insurance company just like a traditional visit in the office.  As this is a virtual visit, video technology does not allow for your provider to perform a traditional examination.  This [name] limit your provider's ability to fully assess your condition.  If your provider identifies any concerns that need to be evaluated in person or the need to arrange testing such as labs, EKG, etc, we will make arrangements to do so.    Although advances in technology are sophisticated, we cannot ensure that it will always work on either your end or our end.  If the connection with a video visit is poor, we [name] have to switch to a telephone visit.  With either a video or telephone visit, we are not always able to ensure that we have a secure connection.   I need to obtain your verbal consent now.   Are you willing to proceed with your visit today?   [name] has provided verbal consent on 12/03/2023 for a virtual visit (video or telephone).   [name] LABOR, CMA 12/03/2023  10:12 AM

## 2024-01-05 NOTE — Progress Notes (Signed)
 Laxmi Choung                                          MRN: [id number]   01/05/2024   The VBCI Quality Team Specialist reviewed this patient medical record for the purposes of chart review for care gap closure. The following were reviewed: abstraction for care gap closure-kidney health evaluation for diabetes:eGFR  and uACR.    VBCI Quality Team

## 2024-01-30 ENCOUNTER — Other Ambulatory Visit: Payer: Self-pay | Admitting: Nurse Practitioner

## 2024-01-30 DIAGNOSIS — M150 Primary generalized (osteo)arthritis: Secondary | ICD-10-CM

## 2024-02-08 ENCOUNTER — Encounter: Payer: Self-pay | Admitting: Radiology

## 2024-02-22 DIAGNOSIS — H5203 Hypermetropia, bilateral: Secondary | ICD-10-CM | POA: Diagnosis not present

## 2024-02-22 LAB — OPHTHALMOLOGY REPORT-SCANNED

## 2024-03-11 ENCOUNTER — Other Ambulatory Visit: Payer: Self-pay

## 2024-03-11 ENCOUNTER — Encounter: Payer: Self-pay | Admitting: Physician Assistant

## 2024-03-11 ENCOUNTER — Ambulatory Visit: Admitting: Physician Assistant

## 2024-03-11 ENCOUNTER — Other Ambulatory Visit (INDEPENDENT_AMBULATORY_CARE_PROVIDER_SITE_OTHER): Payer: Self-pay

## 2024-03-11 DIAGNOSIS — M1712 Unilateral primary osteoarthritis, left knee: Secondary | ICD-10-CM | POA: Diagnosis not present

## 2024-03-11 DIAGNOSIS — M1711 Unilateral primary osteoarthritis, right knee: Secondary | ICD-10-CM

## 2024-03-11 DIAGNOSIS — M170 Bilateral primary osteoarthritis of knee: Secondary | ICD-10-CM | POA: Diagnosis not present

## 2024-03-11 MED ORDER — LIDOCAINE HCL 1 % IJ SOLN
4.0000 mL | INTRAMUSCULAR | Status: AC | PRN
  Administered 2024-03-11: 4 mL

## 2024-03-11 MED ORDER — METHYLPREDNISOLONE ACETATE 40 MG/ML IJ SUSP
40.0000 mg | INTRAMUSCULAR | Status: AC | PRN
  Administered 2024-03-11: 40 mg via INTRA_ARTICULAR

## 2024-03-11 NOTE — Progress Notes (Signed)
 Office Visit Note   Patient: April Hill           Date of Birth: 1940/07/23           MRN: [id number] Visit Date: 03/11/2024              Requested by: Caro Harlene POUR, NP [address] [place],  [place] [address] PCP: Caro Harlene POUR, NP  Chief Complaint  Patient presents with  . Right Knee - Pain  . Left Knee - Pain      HPI: Patient is a pleasant 83 year old woman who comes in today requesting bilateral steroid injections into her knees.  She has a history of osteoarthritis of both of her knees  Assessment & Plan: Visit Diagnoses:  1. Unilateral primary osteoarthritis, left knee   2. Unilateral primary osteoarthritis, right knee     Plan: X-rays were reviewed today that show end-stage tricompartmental arthritis.  Went forward with injections May follow-up with me as needed  Follow-Up Instructions: No follow-ups on file.   Ortho Exam  Patient is alert, oriented, no adenopathy, well-dressed, normal affect, normal respiratory effort. Examination bilateral knees no effusion no erythema grinding with range of motion little patellofemoral mobility distally she is neurovascularly intact compartments are soft and nontender    Imaging: No results found. No images are attached to the encounter.  Labs: Lab Results  Component Value Date   HGBA1C 6.5 (H) 11/09/2023   HGBA1C 6.4 (H) 05/11/2023   HGBA1C 6.5 (H) 11/07/2022     No results found for: ALBUMIN, PREALBUMIN, CBC  No results found for: MG No results found for: VD25OH  No results found for: PREALBUMIN    Latest Ref Rng & Units 11/09/2023   10:51 AM 05/11/2023   11:46 AM 11/07/2022    2:05 PM  CBC EXTENDED  WBC 3.8 - 10.8 Thousand/uL 9.7  9.6  12.0   RBC 3.80 - 5.10 Million/uL 5.20  5.06  5.23   Hemoglobin 11.7 - 15.5 g/dL 84.6  84.9  84.7   HCT 35.0 - 45.0 % 47.2  46.5  46.9   Platelets 140 - 400 Thousand/uL 286  299  302   NEUT# 1,500 - 7,800 cells/uL 4,540  4,982  5,772   Lymph# 850 -  3,900 cells/uL   5,004      There is no height or weight on file to calculate BMI.  Orders:  Orders Placed This Encounter  Procedures  . XR Knee 1-2 Views Right  . XR Knee 1-2 Views Left   No orders of the defined types were placed in this encounter.    Procedures: Large Joint Inj: bilateral knee on 03/11/2024 9:10 AM Indications: pain and diagnostic evaluation Details: 25 G 1.5 in needle, anteromedial approach  Arthrogram: No  Medications (Right): 4 mL lidocaine  1 %; 40 mg methylPREDNISolone  acetate 40 MG/ML Medications (Left): 4 mL lidocaine  1 %; 40 mg methylPREDNISolone  acetate 40 MG/ML Outcome: tolerated well, no immediate complications Procedure, treatment alternatives, risks and benefits explained, specific risks discussed. Consent was given by the patient.     Clinical Data: No additional findings.  ROS:  All other systems negative, except as noted in the HPI. Review of Systems  Objective: Vital Signs: There were no vitals taken for this visit.  Specialty Comments:  No specialty comments available.  PMFS History: Patient Active Problem List   Diagnosis Date Noted  . Diabetes (HCC) 06/05/2022  . Recurrent depression 06/03/2022  . Degenerative arthritis of left shoulder  region 10/31/2021  . Osteoarthritis of knees, bilateral 10/25/2021  . Essential hypertension   . Hyperlipemia   . Anxiety and depression   . Osteoarthritis   . Irritable bowel syndrome with diarrhea 11/05/2016  . History of colon polyps 11/05/2016  . Abnormal nuclear stress test 08/05/2013  . Vitamin D deficiency 03/11/2011  . Morbid obesity (HCC) 03/11/2011  . Gastroesophageal reflux disease without esophagitis 03/11/2011  . Abnormal glucose 03/11/2011   Past Medical History:  Diagnosis Date  . Anxiety and depression   . Cataract   . Diabetes (HCC)   . 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 Fall   . GERD (gastroesophageal reflux disease)   . Hyperlipemia    . Hypertension   . Osteoarthritis   . Thyroid  disease     Family History  Problem Relation Age of Onset  . Hypertension Mother   . Heart disease Mother   . Heart attack Father 42  . Heart disease Father   . Heart attack Sister   . Arthritis Sister   . Diabetes Brother     Past Surgical History:  Procedure Laterality Date  . ABDOMINAL HYSTERECTOMY  1978  . BREAST LUMPECTOMY  1980  . SALIVARY STONE REMOVAL  1980's  . TONSILLECTOMY  1950's   Social History   Occupational History  . Not on file  Tobacco Use  . Smoking status: Never  . Smokeless tobacco: Never  Vaping Use  . Vaping status: Never Used  Substance and Sexual Activity  . Alcohol use: No  . Drug use: No  . Sexual activity: Not Currently

## 2024-03-13 ENCOUNTER — Other Ambulatory Visit: Payer: Self-pay | Admitting: Nurse Practitioner

## 2024-04-09 ENCOUNTER — Other Ambulatory Visit: Payer: Self-pay | Admitting: Nurse Practitioner

## 2024-04-09 DIAGNOSIS — M150 Primary generalized (osteo)arthritis: Secondary | ICD-10-CM

## 2024-05-13 ENCOUNTER — Ambulatory Visit: Payer: Self-pay | Admitting: Nurse Practitioner

## 2024-05-13 ENCOUNTER — Encounter: Payer: Self-pay | Admitting: Nurse Practitioner

## 2024-05-13 VITALS — BP 132/78 | HR 90 | Temp 97.8°F | Ht 64.0 in | Wt 198.4 lb

## 2024-05-13 DIAGNOSIS — E782 Mixed hyperlipidemia: Secondary | ICD-10-CM

## 2024-05-13 DIAGNOSIS — M150 Primary generalized (osteo)arthritis: Secondary | ICD-10-CM

## 2024-05-13 DIAGNOSIS — K580 Irritable bowel syndrome with diarrhea: Secondary | ICD-10-CM

## 2024-05-13 DIAGNOSIS — I10 Essential (primary) hypertension: Secondary | ICD-10-CM

## 2024-05-13 DIAGNOSIS — E1169 Type 2 diabetes mellitus with other specified complication: Secondary | ICD-10-CM

## 2024-05-13 DIAGNOSIS — K219 Gastro-esophageal reflux disease without esophagitis: Secondary | ICD-10-CM

## 2024-05-13 LAB — CBC WITH DIFFERENTIAL/PLATELET
Absolute Lymphocytes: 4060 {cells}/uL — ABNORMAL HIGH (ref 850–3900)
Absolute Monocytes: 657 {cells}/uL (ref 200–950)
Basophils Absolute: 101 {cells}/uL (ref 0–200)
Basophils Relative: 1 %
Eosinophils Absolute: 273 {cells}/uL (ref 15–500)
Eosinophils Relative: 2.7 %
HCT: 46.2 % — ABNORMAL HIGH (ref 35.9–46.0)
Hemoglobin: 15.3 g/dL (ref 11.7–15.5)
MCH: 29.8 pg (ref 27.0–33.0)
MCHC: 33.1 g/dL (ref 31.6–35.4)
MCV: 89.9 fL (ref 81.4–101.7)
MPV: 11.6 fL (ref 7.5–12.5)
Monocytes Relative: 6.5 %
Neutro Abs: 5010 {cells}/uL (ref 1500–7800)
Neutrophils Relative %: 49.6 %
Platelets: 287 10*3/uL (ref 140–400)
RBC: 5.14 Million/uL — ABNORMAL HIGH (ref 3.80–5.10)
RDW: 13.4 % (ref 11.0–15.0)
Total Lymphocyte: 40.2 %
WBC: 10.1 10*3/uL (ref 3.8–10.8)

## 2024-05-13 LAB — COMPREHENSIVE METABOLIC PANEL WITH GFR
AG Ratio: 2 (calc) (ref 1.0–2.5)
ALT: 14 U/L (ref 6–29)
AST: 14 U/L (ref 10–35)
Albumin: 4.2 g/dL (ref 3.6–5.1)
Alkaline phosphatase (APISO): 171 U/L — ABNORMAL HIGH (ref 37–153)
BUN/Creatinine Ratio: 36 (calc) — ABNORMAL HIGH (ref 6–22)
BUN: 26 mg/dL — ABNORMAL HIGH (ref 7–25)
CO2: 25 mmol/L (ref 20–32)
Calcium: 9.9 mg/dL (ref 8.6–10.4)
Chloride: 105 mmol/L (ref 98–110)
Creat: 0.73 mg/dL (ref 0.60–0.95)
Globulin: 2.1 g/dL (ref 1.9–3.7)
Glucose, Bld: 104 mg/dL — ABNORMAL HIGH (ref 65–99)
Potassium: 4.2 mmol/L (ref 3.5–5.3)
Sodium: 139 mmol/L (ref 135–146)
Total Bilirubin: 0.5 mg/dL (ref 0.2–1.2)
Total Protein: 6.3 g/dL (ref 6.1–8.1)
eGFR: 81 mL/min/{1.73_m2}

## 2024-05-13 LAB — LIPID PANEL
Cholesterol: 151 mg/dL
HDL: 62 mg/dL
LDL Cholesterol (Calc): 66 mg/dL
Non-HDL Cholesterol (Calc): 89 mg/dL
Total CHOL/HDL Ratio: 2.4 (calc)
Triglycerides: 146 mg/dL

## 2024-05-13 NOTE — Progress Notes (Signed)
 "  Location[facility]  [facility]  Provider: Caro Harlene POUR, NP  Goals of Care:     12/03/2023   10:10 AM  Advanced Directives  Does Patient Have a Medical Advance Directive? Yes  Type of Estate Agent of [place];Living will;Out of facility DNR (pink MOST or yellow form)  Does patient want to make changes to medical advance directive? No - Patient declined  Copy of Healthcare Power of Attorney in Chart? No - copy requested     Chief Complaint  Patient presents with   Medication management of chronic issues    6 month follow up. Foot exam due.     HPI: Patient is a 84 y.o. female seen today for medical management of chronic diseases.    Pt hx significant for T2DM, osteoarthritis, IBS, GERD, hypertension, hyperlipidemia.  Pt states no significant changes in symptoms since last visit; pt states urinary stress incontinence but states she is able to be self sufficient, is aware of need to urinate, and can self manage any episodes of incontinence  pt states acid reflux is well controlled with not significant changes in quality of life; states she rarely needs to take a Tums after certain meals.   Pt states weight is stable, denies fatigue, dizziness; endorses headache associated with tablet use; pt states she drinks 4-5 tall glasses of water per day, denies other lifestyle or dietary interventions. Denies interest in pursuing changes at this time.   States IBS, diarrhea well managed with current therapy. Pt states aware of T2DM dx, endorses understanding of education about risks of maintaining high blood sugar levels.   Pt states osteoarthritis is well managed on maloxicm 7.5 mg; pt states understanding of risks associated with chronic NSAID use. A&Ox3.    Past Medical History:  Diagnosis Date   Anxiety and depression    Cataract    Diabetes (HCC)    Eye abnormality    film over eye that was not cataract   Fall    GERD (gastroesophageal reflux disease)     Hyperlipemia    Hypertension    Osteoarthritis    Thyroid  disease     Past Surgical History:  Procedure Laterality Date   ABDOMINAL HYSTERECTOMY  1978   BREAST LUMPECTOMY  1980   SALIVARY STONE REMOVAL  1980's   TONSILLECTOMY  1950's    Allergies[1]  Allergies as of 05/13/2024       Reactions   Amoxicillin Diarrhea        Medication List        Accurate as of May 13, 2024 10:02 AM. If you have any questions, ask your nurse or doctor.          acetaminophen 500 MG tablet Commonly known as: TYLENOL Take 500 mg by mouth as needed.   aspirin EC 81 MG tablet Take 81 mg by mouth daily.   dicyclomine  20 MG tablet Commonly known as: BENTYL  TAKE 1 TABLET TWICE DAILY   DULoxetine  60 MG capsule Commonly known as: CYMBALTA  TAKE 1 CAPSULE EVERY DAY   esomeprazole  40 MG capsule Commonly known as: NEXIUM  Take 40 mg by mouth daily at 12 noon.   lisinopril  20 MG tablet Commonly known as: ZESTRIL  Take 1 tablet (20 mg total) by mouth daily.   loperamide  2 MG tablet Commonly known as: IMODIUM  A-D Take 1 tablet (2 mg total) by mouth 3 (three) times daily as needed for diarrhea or loose stools (for diarrhea).   meloxicam  7.5 MG tablet Commonly known  as: MOBIC  TAKE 1 TABLET EVERY DAY AS NEEDED FOR PAIN   metoprolol  succinate 50 MG 24 hr tablet Commonly known as: TOPROL -XL TAKE 1 TABLET EVERY DAY WITH OR IMMEDIATELY FOLLOWING A MEAL.   rosuvastatin  5 MG tablet Commonly known as: CRESTOR  TAKE 1 TABLET THREE TIMES WEEKLY   Vitamin D3 50 MCG (2000 UT) Tabs Take 1 tablet by mouth daily.        Review of Systems:  Review of Systems  Constitutional:  Negative for activity change, appetite change, fatigue and unexpected weight change.  HENT:  Negative for congestion and hearing loss.   Eyes: Negative.   Respiratory:  Negative for cough and shortness of breath.   Cardiovascular:  Negative for chest pain, palpitations and leg swelling.  Gastrointestinal:   Positive for diarrhea. Negative for abdominal pain and constipation.  Genitourinary:  Negative for difficulty urinating and dysuria.  Musculoskeletal:  Positive for arthralgias and myalgias. Negative for joint swelling.  Skin:  Negative for color change and wound.  Neurological:  Negative for dizziness and weakness.  Psychiatric/Behavioral:  Negative for agitation, behavioral problems and confusion.     Health Maintenance  Topic Date Due   Diabetic kidney evaluation - Urine ACR  05/11/2024   FOOT EXAM  05/10/2024   HEMOGLOBIN A1C  05/11/2024   Influenza Vaccine  07/05/2024 (Originally 11/06/2023)   DTaP/Tdap/Td (2 - Td or Tdap) 05/13/2025 (Originally 04/07/2022)   Diabetic kidney evaluation - eGFR measurement  11/08/2024   Medicare Annual Wellness (AWV)  12/02/2024   OPHTHALMOLOGY EXAM  02/21/2025   Pneumococcal Vaccine: 50+ Years  Completed   Zoster Vaccines- Shingrix  Completed   Meningococcal B Vaccine  Aged Out   Bone Density Scan  Discontinued   COVID-19 Vaccine  Discontinued    Physical Exam: Vitals:   05/13/24 0959  BP: 132/78  Pulse: 90  Temp: 97.8 F (36.6 C)  SpO2: 96%  Weight: 198 lb 6.4 oz (90 kg)  Height: 5' 4 (1.626 m)   Body mass index is 34.06 kg/m. Physical Exam Constitutional:      General: She is not in acute distress.    Appearance: She is well-developed. She is not diaphoretic.  HENT:     Head: Normocephalic and atraumatic.     Mouth/Throat:     Pharynx: No oropharyngeal exudate.  Eyes:     Conjunctiva/sclera: Conjunctivae normal.     Pupils: Pupils are equal, round, and reactive to light.  Cardiovascular:     Rate and Rhythm: Normal rate and regular rhythm.     Heart sounds: Normal heart sounds.  Pulmonary:     Effort: Pulmonary effort is normal.     Breath sounds: Normal breath sounds.  Abdominal:     General: Bowel sounds are normal.     Palpations: Abdomen is soft.  Musculoskeletal:     Cervical back: Normal range of motion and neck  supple.     Right lower leg: No edema.     Left lower leg: No edema.  Skin:    General: Skin is warm and dry.  Neurological:     Mental Status: She is alert.     Motor: Weakness present.     Gait: Gait abnormal.  Psychiatric:        Mood and Affect: Mood normal.     Labs reviewed: Basic Metabolic Panel: Recent Labs    11/09/23 1051  NA 139  K 4.8  CL 104  CO2 26  GLUCOSE 105*  BUN 24  CREATININE 0.82  CALCIUM  9.8   Liver Function Tests: Recent Labs    11/09/23 1051  AST 15  ALT 12  BILITOT 0.7  PROT 6.3   No results for input(s): LIPASE, AMYLASE in the last 8760 hours. No results for input(s): AMMONIA in the last 8760 hours. CBC: Recent Labs    11/09/23 1051  WBC 9.7  NEUTROABS 4,540  HGB 15.3  HCT 47.2*  MCV 90.8  PLT 286   Lipid Panel: No results for input(s): CHOL, HDL, LDLCALC, TRIG, CHOLHDL, LDLDIRECT in the last 8760 hours. Lab Results  Component Value Date   HGBA1C 6.5 (H) 11/09/2023    Procedures since last visit: No results found.  Assessment/Plan 1. Type 2 diabetes mellitus with other specified complication, without long-term current use of insulin (HCC) (Primary) - pt education on lifestyle modifications and risks on maintaining high blood sugar levels. Verbalized understanding. -Encouraged dietary compliance, routine foot care/monitoring and to keep up with diabetic eye exams through ophthalmology  - Hemoglobin A1c  2. Primary osteoarthritis involving multiple joints - pt education on chronic NSAID use, advised to seek alternative therapies with hx of IBS. Pt declined. -continues on nexium  for GI protection  3. Irritable bowel syndrome with diarrhea - pt IBS symptoms well managed. Continue current therapy  4. Gastroesophageal reflux disease without esophagitis - pt GERD well controlled on current therapy; education on lifestyle modifications -continues on nexium    5. Essential hypertension - pt education on  lifestyle modifications Continues on metoprolol   - CBC with Differential/Platelet - CMP  6. Mixed hyperlipidemia - pt education on lifestyle modifications for lipid control Continue on crestor  5 mg daily  - Lipid Panel  Return in about 6 months (around 11/10/2024) for routine follow up.  Dale Bound, Student-FNP -I personally was present during the history, physical exam and medical decision-making activities of this service and have verified that the service and findings are accurately documented in the students note  Harlene K. Caro BODILY  [facility] [phone] 8 am - 5 pm) [phone] (after hours)     [1]  Allergies Allergen Reactions   Amoxicillin Diarrhea   "

## 2024-11-14 ENCOUNTER — Ambulatory Visit: Admitting: Nurse Practitioner

## 2024-12-08 ENCOUNTER — Ambulatory Visit: Payer: Self-pay | Admitting: Nurse Practitioner
